# Patient Record
Sex: Male | Born: 1940 | Race: White | Hispanic: No | Smoking: Never smoker
Health system: Southern US, Community
[De-identification: ages and names within clinical notes are randomized; demographics above are authoritative.]

## PROBLEM LIST (undated history)

## (undated) DIAGNOSIS — I10 Essential (primary) hypertension: Secondary | ICD-10-CM

## (undated) DIAGNOSIS — E785 Hyperlipidemia, unspecified: Secondary | ICD-10-CM

## (undated) DIAGNOSIS — Z9079 Acquired absence of other genital organ(s): Secondary | ICD-10-CM

## (undated) HISTORY — DX: Acquired absence of other genital organ(s): Z90.79

## (undated) HISTORY — PX: OTHER SURGICAL HISTORY: SHX169

## (undated) HISTORY — DX: Hyperlipidemia, unspecified: E78.5

## (undated) HISTORY — DX: Essential (primary) hypertension: I10

## (undated) HISTORY — PX: ANKLE ARTHROSCOPY W/ OPEN REPAIR: SHX1145

## (undated) HISTORY — PX: PROSTATE SURGERY: SHX751

## (undated) HISTORY — PX: TONSILLECTOMY: SUR1361

---

## 2003-05-07 ENCOUNTER — Ambulatory Visit (HOSPITAL_COMMUNITY): Admission: RE | Admit: 2003-05-07 | Discharge: 2003-05-07 | Payer: Self-pay | Admitting: Urology

## 2003-06-04 ENCOUNTER — Ambulatory Visit: Admission: RE | Admit: 2003-06-04 | Discharge: 2003-06-26 | Payer: Self-pay | Admitting: Radiation Oncology

## 2004-04-21 ENCOUNTER — Ambulatory Visit: Payer: Self-pay | Admitting: Internal Medicine

## 2004-05-24 ENCOUNTER — Ambulatory Visit: Payer: Self-pay | Admitting: Internal Medicine

## 2004-07-27 ENCOUNTER — Ambulatory Visit: Payer: Self-pay | Admitting: Internal Medicine

## 2004-11-09 ENCOUNTER — Ambulatory Visit: Payer: Self-pay | Admitting: Internal Medicine

## 2005-02-22 ENCOUNTER — Ambulatory Visit: Payer: Self-pay | Admitting: Internal Medicine

## 2005-03-01 ENCOUNTER — Ambulatory Visit: Payer: Self-pay | Admitting: Internal Medicine

## 2005-08-11 ENCOUNTER — Ambulatory Visit: Payer: Self-pay | Admitting: Internal Medicine

## 2005-11-03 ENCOUNTER — Ambulatory Visit: Payer: Self-pay | Admitting: Internal Medicine

## 2005-11-10 ENCOUNTER — Ambulatory Visit: Payer: Self-pay | Admitting: Internal Medicine

## 2006-10-03 ENCOUNTER — Ambulatory Visit: Payer: Self-pay | Admitting: Internal Medicine

## 2006-10-26 ENCOUNTER — Ambulatory Visit: Payer: Self-pay | Admitting: Internal Medicine

## 2006-10-26 LAB — CONVERTED CEMR LAB
ALT: 24 units/L (ref 0–40)
AST: 28 units/L (ref 0–37)
Albumin: 4.1 g/dL (ref 3.5–5.2)
Alkaline Phosphatase: 74 units/L (ref 39–117)
Bilirubin, Direct: 0.1 mg/dL (ref 0.0–0.3)
Cholesterol: 209 mg/dL (ref 0–200)
Direct LDL: 86.5 mg/dL
HDL: 51.9 mg/dL (ref >39.0)
Total Bilirubin: 0.9 mg/dL (ref 0.3–1.2)
Total CHOL/HDL Ratio: 4
Total Protein: 6.5 g/dL (ref 6.0–8.3)
Triglycerides: 344 mg/dL (ref 0–149)
VLDL: 69 mg/dL — ABNORMAL HIGH (ref 0–40)

## 2006-11-13 ENCOUNTER — Ambulatory Visit: Payer: Self-pay | Admitting: Internal Medicine

## 2006-11-15 ENCOUNTER — Encounter: Payer: Self-pay | Admitting: Internal Medicine

## 2007-02-05 ENCOUNTER — Encounter: Payer: Self-pay | Admitting: Internal Medicine

## 2007-02-07 DIAGNOSIS — E781 Pure hyperglyceridemia: Secondary | ICD-10-CM

## 2007-02-07 DIAGNOSIS — I10 Essential (primary) hypertension: Secondary | ICD-10-CM

## 2007-02-08 ENCOUNTER — Ambulatory Visit: Payer: Self-pay | Admitting: Internal Medicine

## 2007-02-08 LAB — CONVERTED CEMR LAB
Cholesterol, target level: 200 mg/dL
HDL goal, serum: 40 mg/dL
LDL Goal: 130 mg/dL

## 2007-03-16 ENCOUNTER — Ambulatory Visit: Payer: Self-pay | Admitting: Internal Medicine

## 2007-05-30 ENCOUNTER — Ambulatory Visit: Payer: Self-pay | Admitting: Internal Medicine

## 2007-05-30 LAB — CONVERTED CEMR LAB
ALT: 25 units/L (ref 0–53)
AST: 27 units/L (ref 0–37)
Albumin: 4.2 g/dL (ref 3.5–5.2)
Alkaline Phosphatase: 43 units/L (ref 39–117)
Bilirubin, Direct: 0.2 mg/dL (ref 0.0–0.3)
Cholesterol: 176 mg/dL (ref 0–200)
HDL: 66.3 mg/dL (ref >39.0)
LDL Cholesterol: 94 mg/dL (ref 0–99)
Total Bilirubin: 0.8 mg/dL (ref 0.3–1.2)
Total CHOL/HDL Ratio: 2.7
Total Protein: 6.4 g/dL (ref 6.0–8.3)
Triglycerides: 77 mg/dL (ref 0–149)
VLDL: 15 mg/dL (ref 0–40)

## 2007-06-06 ENCOUNTER — Ambulatory Visit: Payer: Self-pay | Admitting: Internal Medicine

## 2007-06-06 DIAGNOSIS — M949 Disorder of cartilage, unspecified: Secondary | ICD-10-CM

## 2007-06-06 DIAGNOSIS — M899 Disorder of bone, unspecified: Secondary | ICD-10-CM | POA: Insufficient documentation

## 2007-06-06 LAB — CONVERTED CEMR LAB
Cholesterol, target level: 200 mg/dL
HDL goal, serum: 40 mg/dL
LDL Goal: 129 mg/dL

## 2007-06-11 ENCOUNTER — Telehealth: Payer: Self-pay | Admitting: Internal Medicine

## 2007-06-11 LAB — CONVERTED CEMR LAB: Vit D, 1,25-Dihydroxy: 22 — ABNORMAL LOW (ref 30–89)

## 2007-07-09 ENCOUNTER — Telehealth: Payer: Self-pay | Admitting: Internal Medicine

## 2007-07-10 ENCOUNTER — Ambulatory Visit: Payer: Self-pay | Admitting: Internal Medicine

## 2007-07-10 DIAGNOSIS — M5380 Other specified dorsopathies, site unspecified: Secondary | ICD-10-CM | POA: Insufficient documentation

## 2007-08-07 ENCOUNTER — Ambulatory Visit: Payer: Self-pay | Admitting: Internal Medicine

## 2007-09-05 ENCOUNTER — Encounter: Payer: Self-pay | Admitting: Internal Medicine

## 2007-09-18 ENCOUNTER — Ambulatory Visit: Payer: Self-pay | Admitting: Internal Medicine

## 2007-09-18 DIAGNOSIS — F4320 Adjustment disorder, unspecified: Secondary | ICD-10-CM | POA: Insufficient documentation

## 2007-09-18 LAB — CONVERTED CEMR LAB
BUN: 19 mg/dL (ref 6–23)
CO2: 30 meq/L (ref 19–32)
Calcium: 9.3 mg/dL (ref 8.4–10.5)
Chloride: 105 meq/L (ref 96–112)
Creatinine, Ser: 1 mg/dL (ref 0.4–1.5)
GFR calc non Af Amer: 79 mL/min

## 2007-09-27 ENCOUNTER — Ambulatory Visit: Payer: Self-pay | Admitting: Gastroenterology

## 2007-10-10 ENCOUNTER — Encounter: Payer: Self-pay | Admitting: Internal Medicine

## 2007-10-10 ENCOUNTER — Encounter: Payer: Self-pay | Admitting: Gastroenterology

## 2007-10-10 ENCOUNTER — Ambulatory Visit: Payer: Self-pay | Admitting: Gastroenterology

## 2007-10-11 ENCOUNTER — Encounter: Payer: Self-pay | Admitting: Gastroenterology

## 2008-01-23 ENCOUNTER — Ambulatory Visit: Payer: Self-pay | Admitting: Internal Medicine

## 2008-01-23 LAB — CONVERTED CEMR LAB
Albumin: 3.9 g/dL (ref 3.5–5.2)
HDL: 66.6 mg/dL (ref >39.0)
LDL Cholesterol: 92 mg/dL (ref 0–99)
Total Bilirubin: 0.8 mg/dL (ref 0.3–1.2)
Total CHOL/HDL Ratio: 2.6
Triglycerides: 62 mg/dL (ref 0–149)

## 2008-01-30 ENCOUNTER — Ambulatory Visit: Payer: Self-pay | Admitting: Internal Medicine

## 2008-02-04 LAB — CONVERTED CEMR LAB: Vit D, 1,25-Dihydroxy: 34 (ref 30–89)

## 2008-04-30 ENCOUNTER — Ambulatory Visit: Payer: Self-pay | Admitting: Internal Medicine

## 2008-04-30 LAB — CONVERTED CEMR LAB: Vit D, 1,25-Dihydroxy: 41 (ref 30–89)

## 2008-08-13 ENCOUNTER — Ambulatory Visit: Payer: Self-pay | Admitting: Internal Medicine

## 2008-08-13 LAB — CONVERTED CEMR LAB: Vit D, 25-Hydroxy: 37 ng/mL (ref 30–89)

## 2008-08-19 ENCOUNTER — Encounter: Payer: Self-pay | Admitting: Internal Medicine

## 2008-10-01 ENCOUNTER — Encounter: Payer: Self-pay | Admitting: Internal Medicine

## 2008-11-26 ENCOUNTER — Ambulatory Visit: Payer: Self-pay | Admitting: Internal Medicine

## 2008-11-26 LAB — CONVERTED CEMR LAB
Alkaline Phosphatase: 51 units/L (ref 39–117)
Bilirubin, Direct: 0 mg/dL (ref 0.0–0.3)
CRP, High Sensitivity: 1 (ref 0.00–5.00)
Calcium: 9 mg/dL (ref 8.4–10.5)
Eosinophils Relative: 4.4 % (ref 0.0–5.0)
GFR calc non Af Amer: 70.29 mL/min (ref >60)
HDL: 66.4 mg/dL (ref >39.00)
LDL Cholesterol: 96 mg/dL (ref 0–99)
Lymphocytes Relative: 31 % (ref 12.0–46.0)
MCV: 92.3 fL (ref 78.0–100.0)
Monocytes Absolute: 0.5 10*3/uL (ref 0.1–1.0)
Neutrophils Relative %: 55 % (ref 43.0–77.0)
Platelets: 183 10*3/uL (ref 150.0–400.0)
Potassium: 4.5 meq/L (ref 3.5–5.1)
Sodium: 145 meq/L (ref 135–145)
Total Bilirubin: 0.8 mg/dL (ref 0.3–1.2)
Total CHOL/HDL Ratio: 3
VLDL: 20.4 mg/dL (ref 0.0–40.0)
WBC: 5.3 10*3/uL (ref 4.5–10.5)

## 2008-12-03 ENCOUNTER — Ambulatory Visit: Payer: Self-pay | Admitting: Internal Medicine

## 2008-12-03 DIAGNOSIS — D649 Anemia, unspecified: Secondary | ICD-10-CM

## 2008-12-03 LAB — CONVERTED CEMR LAB
Basophils Absolute: 0 10*3/uL (ref 0.0–0.1)
Basophils Relative: 1 % (ref 0.0–3.0)
Eosinophils Absolute: 0.2 10*3/uL (ref 0.0–0.7)
Lymphocytes Relative: 31.3 % (ref 12.0–46.0)
MCHC: 34.3 g/dL (ref 30.0–36.0)
Neutrophils Relative %: 54.7 % (ref 43.0–77.0)
RBC: 4.15 M/uL — ABNORMAL LOW (ref 4.22–5.81)
RDW: 13 % (ref 11.5–14.6)
Saturation Ratios: 22.9 % (ref 20.0–50.0)
Transferrin: 265.1 mg/dL (ref 212.0–360.0)
Vitamin B-12: 279 pg/mL (ref 211–911)

## 2009-03-10 ENCOUNTER — Ambulatory Visit: Payer: Self-pay | Admitting: Internal Medicine

## 2009-03-24 ENCOUNTER — Ambulatory Visit: Payer: Self-pay | Admitting: Internal Medicine

## 2009-03-24 LAB — CONVERTED CEMR LAB
ALT: 23 units/L (ref 0–53)
Albumin: 4 g/dL (ref 3.5–5.2)
Alkaline Phosphatase: 48 units/L (ref 39–117)
Basophils Relative: 1.1 % (ref 0.0–3.0)
CO2: 29 meq/L (ref 19–32)
Chloride: 106 meq/L (ref 96–112)
Eosinophils Absolute: 0.2 10*3/uL (ref 0.0–0.7)
Hemoglobin: 13.6 g/dL (ref 13.0–17.0)
Lymphocytes Relative: 27.7 % (ref 12.0–46.0)
MCHC: 33.4 g/dL (ref 30.0–36.0)
MCV: 94.7 fL (ref 78.0–100.0)
Monocytes Absolute: 0.4 10*3/uL (ref 0.1–1.0)
Neutro Abs: 3.2 10*3/uL (ref 1.4–7.7)
Nitrite: NEGATIVE
RBC: 4.3 M/uL (ref 4.22–5.81)
Sodium: 145 meq/L (ref 135–145)
Specific Gravity, Urine: 1.02
Total CHOL/HDL Ratio: 3
Total Protein: 7.2 g/dL (ref 6.0–8.3)
WBC Urine, dipstick: NEGATIVE

## 2009-04-01 ENCOUNTER — Ambulatory Visit: Payer: Self-pay | Admitting: Internal Medicine

## 2009-04-01 DIAGNOSIS — Z96649 Presence of unspecified artificial hip joint: Secondary | ICD-10-CM

## 2009-07-20 ENCOUNTER — Telehealth: Payer: Self-pay | Admitting: Internal Medicine

## 2009-07-23 ENCOUNTER — Ambulatory Visit: Payer: Self-pay | Admitting: Internal Medicine

## 2009-07-23 LAB — CONVERTED CEMR LAB
ALT: 31 units/L (ref 0–53)
AST: 31 units/L (ref 0–37)
Alkaline Phosphatase: 49 units/L (ref 39–117)
Bilirubin, Direct: 0.2 mg/dL (ref 0.0–0.3)
Total CHOL/HDL Ratio: 2
Total Protein: 7 g/dL (ref 6.0–8.3)
Triglycerides: 95 mg/dL (ref 0.0–149.0)

## 2009-07-27 LAB — CONVERTED CEMR LAB: Vit D, 25-Hydroxy: 22 ng/mL — ABNORMAL LOW (ref 30–89)

## 2009-07-29 ENCOUNTER — Ambulatory Visit: Payer: Self-pay | Admitting: Internal Medicine

## 2009-07-29 DIAGNOSIS — R609 Edema, unspecified: Secondary | ICD-10-CM

## 2009-07-30 ENCOUNTER — Encounter: Payer: Self-pay | Admitting: Internal Medicine

## 2009-07-30 ENCOUNTER — Ambulatory Visit: Payer: Self-pay

## 2009-07-31 ENCOUNTER — Telehealth: Payer: Self-pay | Admitting: *Deleted

## 2009-09-16 DIAGNOSIS — E538 Deficiency of other specified B group vitamins: Secondary | ICD-10-CM

## 2009-10-28 ENCOUNTER — Ambulatory Visit: Payer: Self-pay | Admitting: Internal Medicine

## 2009-10-28 LAB — CONVERTED CEMR LAB: Vit D, 25-Hydroxy: 39 ng/mL (ref 30–89)

## 2009-11-03 ENCOUNTER — Ambulatory Visit: Payer: Self-pay | Admitting: Internal Medicine

## 2009-12-01 ENCOUNTER — Encounter: Payer: Self-pay | Admitting: Internal Medicine

## 2010-01-28 ENCOUNTER — Ambulatory Visit: Payer: Self-pay | Admitting: Internal Medicine

## 2010-01-28 LAB — CONVERTED CEMR LAB: Vit D, 25-Hydroxy: 36 ng/mL (ref 30–89)

## 2010-02-04 ENCOUNTER — Ambulatory Visit: Payer: Self-pay | Admitting: Internal Medicine

## 2010-02-04 LAB — CONVERTED CEMR LAB
AST: 32 units/L (ref 0–37)
Alkaline Phosphatase: 51 units/L (ref 39–117)
BUN: 18 mg/dL (ref 6–23)
Basophils Absolute: 0.1 10*3/uL (ref 0.0–0.1)
Calcium: 9.6 mg/dL (ref 8.4–10.5)
Eosinophils Absolute: 0.2 10*3/uL (ref 0.0–0.7)
GFR calc non Af Amer: 68.61 mL/min (ref >60)
Glucose, Bld: 98 mg/dL (ref 70–99)
HDL: 65.8 mg/dL (ref >39.00)
LDL Cholesterol: 112 mg/dL — ABNORMAL HIGH (ref 0–99)
Lymphocytes Relative: 27.4 % (ref 12.0–46.0)
MCHC: 33.9 g/dL (ref 30.0–36.0)
Monocytes Relative: 7.7 % (ref 3.0–12.0)
Neutrophils Relative %: 61.3 % (ref 43.0–77.0)
Platelets: 223 10*3/uL (ref 150.0–400.0)
Potassium: 5.7 meq/L — ABNORMAL HIGH (ref 3.5–5.1)
RDW: 13.9 % (ref 11.5–14.6)
Sodium: 144 meq/L (ref 135–145)
TSH: 1.22 microintl units/mL (ref 0.35–5.50)
Total Bilirubin: 0.4 mg/dL (ref 0.3–1.2)
VLDL: 17.8 mg/dL (ref 0.0–40.0)

## 2010-02-10 ENCOUNTER — Telehealth: Payer: Self-pay | Admitting: Internal Medicine

## 2010-02-11 ENCOUNTER — Ambulatory Visit: Payer: Self-pay | Admitting: Internal Medicine

## 2010-02-12 ENCOUNTER — Encounter: Payer: Self-pay | Admitting: Internal Medicine

## 2010-02-15 ENCOUNTER — Encounter: Payer: Self-pay | Admitting: Internal Medicine

## 2010-02-15 ENCOUNTER — Ambulatory Visit: Payer: Self-pay

## 2010-06-03 ENCOUNTER — Ambulatory Visit
Admission: RE | Admit: 2010-06-03 | Discharge: 2010-06-03 | Payer: Self-pay | Source: Home / Self Care | Attending: Internal Medicine | Admitting: Internal Medicine

## 2010-06-03 ENCOUNTER — Other Ambulatory Visit: Payer: Self-pay | Admitting: Internal Medicine

## 2010-06-03 LAB — CBC WITH DIFFERENTIAL/PLATELET
Basophils Absolute: 0 10*3/uL (ref 0.0–0.1)
Basophils Relative: 0.5 % (ref 0.0–3.0)
Eosinophils Absolute: 0.2 10*3/uL (ref 0.0–0.7)
Eosinophils Relative: 4.7 % (ref 0.0–5.0)
HCT: 38.2 % — ABNORMAL LOW (ref 39.0–52.0)
Hemoglobin: 13 g/dL (ref 13.0–17.0)
Lymphocytes Relative: 31.2 % (ref 12.0–46.0)
Lymphs Abs: 1.6 10*3/uL (ref 0.7–4.0)
MCHC: 34.1 g/dL (ref 30.0–36.0)
MCV: 93.3 fl (ref 78.0–100.0)
Monocytes Absolute: 0.5 10*3/uL (ref 0.1–1.0)
Monocytes Relative: 9.1 % (ref 3.0–12.0)
Neutro Abs: 2.8 10*3/uL (ref 1.4–7.7)
Neutrophils Relative %: 54.5 % (ref 43.0–77.0)
Platelets: 194 10*3/uL (ref 150.0–400.0)
RBC: 4.09 Mil/uL — ABNORMAL LOW (ref 4.22–5.81)
RDW: 13.8 % (ref 11.5–14.6)
WBC: 5.1 10*3/uL (ref 4.5–10.5)

## 2010-06-03 LAB — BASIC METABOLIC PANEL
BUN: 20 mg/dL (ref 6–23)
CO2: 28 mEq/L (ref 19–32)
Calcium: 9.4 mg/dL (ref 8.4–10.5)
Chloride: 103 mEq/L (ref 96–112)
Creatinine, Ser: 1.2 mg/dL (ref 0.4–1.5)
GFR: 63.92 mL/min (ref >60.00)
Glucose, Bld: 71 mg/dL (ref 70–99)
Potassium: 4.8 mEq/L (ref 3.5–5.1)
Sodium: 139 mEq/L (ref 135–145)

## 2010-06-03 LAB — B12 AND FOLATE PANEL
Folate: 24.1 ng/mL (ref >5.9)
Vitamin B-12: 308 pg/mL (ref 211–911)

## 2010-06-13 LAB — CONVERTED CEMR LAB: Cholesterol, target level: 200 mg/dL

## 2010-06-15 NOTE — Miscellaneous (Signed)
Summary: Orders Update  Clinical Lists Changes  Orders: Added new Test order of Venous Duplex Lower Extremity (Venous Duplex Lower) - Signed 

## 2010-06-15 NOTE — Progress Notes (Signed)
----   Converted from flag ---- ---- 07/31/2009 7:45 AM, Stacie Glaze MD wrote: Louis Kent, call pt no DVT... consider compression stocking would recommend 10-20 knee high  ---- 07/30/2009 1:36 PM, Missy Al-Rammal, RVT, RDCS wrote: No evidence of DVT, bilaterally. ------------------------------  Appended Document:  pt informed and doesnt want compression hose now but will call if he decides he wants and we will give script  Appended Document:        New Problems: VITAMIN B12 DEFICIENCY (ICD-266.2)   New Problems: VITAMIN B12 DEFICIENCY (ICD-266.2)

## 2010-06-15 NOTE — Letter (Signed)
Summary: Personalized Plan for Preventive Services  Personalized Plan for Preventive Services   Imported By: Maryln Gottron 02/09/2010 12:38:15  _____________________________________________________________________  External Attachment:    Type:   Image     Comment:   External Document

## 2010-06-15 NOTE — Assessment & Plan Note (Signed)
Summary: 4 month rov/njr   Vital Signs:  Patient profile:   70 year old male Height:      71 inches Weight:      236 pounds BMI:     33.03 Temp:     98.2 degrees F oral Pulse rate:   68 / minute Resp:     14 per minute BP sitting:   132 / 74  (left arm)  Vitals Entered By: Willy Eddy, LPN (July 29, 2009 9:53 AM) CC: roa labs- informed of vitmin d and med sent in, Lipid Management, Hypertension Management   CC:  roa labs- informed of vitmin d and med sent in, Lipid Management, and Hypertension Management.  History of Present Illness: swelling in lefs right greater that left post a "injury) HTN stable and lipid monitering todat... at goal  Hypertension History:      He denies headache, chest pain, palpitations, dyspnea with exertion, orthopnea, PND, peripheral edema, visual symptoms, neurologic problems, syncope, and side effects from treatment.  at goal.        Positive major cardiovascular risk factors include male age 46 years old or older, hyperlipidemia, and hypertension.  Negative major cardiovascular risk factors include no history of diabetes, negative family history for ischemic heart disease, and non-tobacco-user status.        Further assessment for target organ damage reveals no history of ASHD, stroke/TIA, or peripheral vascular disease.    Lipid Management History:      Positive NCEP/ATP III risk factors include male age 36 years old or older and hypertension.  Negative NCEP/ATP III risk factors include non-diabetic, HDL cholesterol greater than 60, no family history for ischemic heart disease, non-tobacco-user status, no ASHD (atherosclerotic heart disease), no prior stroke/TIA, no peripheral vascular disease, and no history of aortic aneurysm.      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 Once Daily 2)  Adult Aspirin Low Strength 81 Mg  Tbdp (Aspirin) .... Once Daily 3)  Daily Combo Multivits/calcium   Tabs (Multiple Vitamins-Calcium) .... Once Daily 4)  Fish Oil Concentrate 1000 Mg  Caps (Omega-3 Fatty Acids) .... 4 Capsules Once Daily 5)  Vitamin D 36644 Unit  Caps (Ergocalciferol) .Marland Kitchen.. 1every Week 6)  Oscal 500/200 D-3 500-200 Mg-Unit  Tabs (Calcium-Vitamin D) .... Once Daily 7)  Avapro 150 Mg  Tabs (Irbesartan) .Marland Kitchen.. 1 Once Daily 8)  Glucosamine-Chondroitin 1500-1200 Mg/62ml Liqd (Glucosamine-Chondroitin) .... 2 Once Daily  Allergies (verified): No Known Drug Allergies  Past History:  Family History: Last updated: 02/08/2007 Family History Lung cancer Family History of Stroke F 1st degree relative  age 76's  Social History: Last updated: 02/07/2007 Retired Married Never Smoked Alcohol use-yes Drug use-no  Risk Factors: Smoking Status: never (07/29/2009)  Past medical, surgical, family and social histories (including risk factors) reviewed, and no changes noted (except as noted below).  Past Medical History: Reviewed history from 02/07/2007 and no changes required. Hyperlipidemia Hypertension  Past Surgical History: Reviewed history from 02/08/2007 and no changes required. ankle repair Prostatectomy 2005 Tonsillectomy  Family History: Reviewed history from 02/08/2007 and no changes required. Family History Lung cancer Family History of Stroke F 1st degree relative  age 110's  Social History: Reviewed history from 02/07/2007 and no changes required. Retired Married Never Smoked Alcohol use-yes Drug  use-no  Review of Systems  The patient denies anorexia, fever, weight loss, weight gain, vision loss, decreased hearing, hoarseness, chest pain, syncope, dyspnea on exertion, peripheral edema, prolonged cough, headaches, hemoptysis, abdominal pain, melena, hematochezia, severe indigestion/heartburn, hematuria, incontinence, genital sores, muscle weakness, suspicious skin lesions, transient blindness, difficulty walking, depression, unusual weight change, abnormal bleeding, enlarged lymph nodes, angioedema, and breast masses.    Physical Exam  General:  Well-developed,well-nourished,in no acute distress; alert,appropriate and cooperative throughout examination Head:  normocephalic and atraumatic.   Eyes:  pupils equal and pupils round.   Ears:  R ear normal and L ear normal.   Nose:  no external deformity and no nasal discharge.   Mouth:  Oral mucosa and oropharynx without lesions or exudates.  Teeth in good repair. Neck:  supple and full ROM.   Lungs:  normal respiratory effort and no wheezes.   Abdomen:  Bowel sounds positive,abdomen soft and non-tender without masses, organomegaly or hernias noted. Msk:  No deformity or scoliosis noted of thoracic or lumbar spine.   Pulses:  R and L carotid,radial,femoral,dorsalis pedis and posterior tibial pulses are full and equal bilaterally Extremities:  trace left pedal edema and 1+ right pedal edema.   Neurologic:  No cranial nerve deficits noted. Station and gait are normal. Plantar reflexes are down-going bilaterally. DTRs are symmetrical throughout. Sensory, motor and coordinative functions appear intact.   Impression & Recommendations:  Problem # 1:  EDEMA (ICD-782.3) Assessment New  2 pulse new edema Discussed elevation of the legs, use of compression stockings, sodium restiction, and medication use.   Orders: Doppler Referral (Doppler)  Complete Medication List: 1)  Tricor 145 Mg Tabs (Fenofibrate) .... Once daily 2)  Adult Aspirin  Low Strength 81 Mg Tbdp (Aspirin) .... Once daily 3)  Daily Combo Multivits/calcium Tabs (Multiple vitamins-calcium) .... Once daily 4)  Fish Oil Concentrate 1000 Mg Caps (Omega-3 fatty acids) .... 4 capsules once daily 5)  Vitamin D 13086 Unit Caps (Ergocalciferol) .Marland Kitchen.. 1every week 6)  Oscal 500/200 D-3 500-200 Mg-unit Tabs (Calcium-vitamin d) .... Once daily 7)  Avapro 150 Mg Tabs (Irbesartan) .Marland Kitchen.. 1 once daily 8)  Glucosamine-chondroitin 1500-1200 Mg/56ml Liqd (Glucosamine-chondroitin) .... 2 once daily  Other Orders: Prescription Created Electronically [phone])  Hypertension Assessment/Plan:      The patient's hypertensive risk group is category B: At least one risk factor (excluding diabetes) with no target organ damage.  His calculated 10 year risk of coronary heart disease is 9 %.  Today's blood pressure is 132/74.  His blood pressure goal is < 140/90.  Lipid Assessment/Plan:      Based on NCEP/ATP III, the patient's risk factor category is "0-1 risk factors".  The patient's lipid goals are as follows: Total cholesterol goal is 200; LDL cholesterol goal is 160; HDL cholesterol goal is 40; Triglyceride goal is 150.  His LDL cholesterol goal has been met.    Patient Instructions: 1)  Please schedule a follow-up appointment in 3 months. 2)  vitamin d  733.00 Prescriptions: VITAMIN D 96295 UNIT  CAPS (ERGOCALCIFEROL) 1every week  #5 x 2   Entered by:   Willy Eddy, LPN   Authorized by:   Stacie Glaze MD   Signed by:  Willy Eddy, LPN on 16/02/9603   Method used:   Electronically to        [facility] [address] [facility] [address] [phone]* (retail)       [address]       [place], [place]  [address]       Ph: [phone] or [phone]       Fax: [phone]   RxID:   [id number]

## 2010-06-15 NOTE — Progress Notes (Signed)
Summary: REQ FOR VIT D CK  Phone Note Call from Patient   Caller: Patient [phone] Reason for Call: Acute Illness, Talk to Nurse, Talk to Doctor Summary of Call: Pt called to req that vit d ck be added to his labs that he is having drawn on Thurs. 07/23/2009.... Can you adv order for same?  Initial call taken by: Debbra Riding,  July 20, 2009 11:14 AM  Follow-up for Phone Call        yes please add vitamin d to list of labs Follow-up by: Willy Eddy, LPN,  July 20, 2009 11:23 AM  Additional Follow-up for Phone Call Additional follow up Details #1::        Phone Call Completed----Done. Additional Follow-up by: Debbra Riding,  July 20, 2009 11:39 AM

## 2010-06-15 NOTE — Miscellaneous (Signed)
Summary: Orders Update  Clinical Lists Changes 

## 2010-06-15 NOTE — Assessment & Plan Note (Signed)
Summary: PT WILL COME IN FASTING/NJR   Vital Signs:  Patient profile:   70 year old male Height:      71 inches Weight:      230 pounds BMI:     32.19 Temp:     98.2 degrees F oral Pulse rate:   72 / minute Resp:     14 per minute BP sitting:   110 / 70  (left arm)  Vitals Entered By: Willy Eddy, LPN (February 04, 2010 10:47 AM)  Nutrition Counseling: Patient's BMI is greater than 25 and therefore counseled on weight management options. CC: annual visit for disease management Is Patient Diabetic? No Nutritional Status BMI of 25 - 29 = overweight  Does patient need assistance? Functional Status Self care Ambulation Normal Comments pts ADLs are normal and has not increased risks for fall or home safety issues  Vision Screening:Left eye w/o correction: 20 / 20 Right Eye w/o correction: 20 / 20 Both eyes w/o correction:  20/ 20  Color vision testing: normal     Vision Comments: has reading galsses but no rx 40db HL: Left  Right  Audiometry Comment: can hear whispered voice    Prevention & Chronic Care Immunizations   Influenza vaccine: Fluvax 3+  (02/04/2010)   Influenza vaccine due: 01/15/2011    Tetanus booster: 05/16/2002: Historical   Tetanus booster due: 05/16/2012    Pneumococcal vaccine: Historical  (05/16/2004)    H. zoster vaccine: 02/04/2010: Zostavax  Colorectal Screening   Hemoccult: Not documented    Colonoscopy: Location:  [facility].    (10/10/2007)   Colonoscopy due: 09/2017  Other Screening   PSA: 0.01  (03/24/2009)   PSA action/deferral: Discussed-PSA requested  (02/04/2010)   Smoking status: never  (02/04/2010)  Lipids   Total Cholesterol: 183  (07/23/2009)   Lipid panel action/deferral: Lipid Panel ordered   LDL: 85  (07/23/2009)   LDL Direct: 86.5  (10/26/2006)   HDL: 79.30  (07/23/2009)   Triglycerides: 95.0  (07/23/2009)   Lipid panel due: 02/05/2011    SGOT (AST): 31  (07/23/2009)   BMP action:  Ordered   SGPT (ALT): 31  (07/23/2009)   Alkaline phosphatase: 49  (07/23/2009)   Total bilirubin: 0.6  (07/23/2009)    Lipid flowsheet reviewed?: Yes   Progress toward LDL goal: At goal  Hypertension   Last Blood Pressure: 110 / 70  (02/04/2010)   Serum creatinine: 1.2  (03/24/2009)   BMP action: Ordered   Serum potassium 4.8  (03/24/2009)    Hypertension flowsheet reviewed?: Yes   Progress toward BP goal: At goal  Self-Management Support :    Patient will work on the following items until the next clinic visit to reach self-care goals:     Medications and monitoring: take my medicines every day  (02/04/2010)     Eating: drink diet soda or water instead of juice or soda  (02/04/2010)     Activity: take a 30 minute walk every day  (02/04/2010)    Hypertension self-management support: BP self-monitoring log  (02/04/2010)    Lipid self-management support: Lipid monitoring log  (02/04/2010)    Nursing Instructions: Give Herpes zoster vaccine today    Primary Care Provider:  Stacie Glaze MD  CC:  annual visit for disease management.  History of Present Illness: Here for Medicare AWV:  1.   Risk factors based on Past M, S, F history:  CAD risk ande PAD risks identified with obesity, htn and hyperlipidemia  2.   Physical Activities:  walks one to two times a week for 30 min or less 3.   Depression/mood:  no depression  noted 4.   Hearing: whispered voice at 6 feet heard 5.   ADL's: performs all activites without limitations 6.   Fall Risk: none 7.   Home Safety:  no risks identified 8.   Height, weight, &visual acuity: see vitals 9.   Counseling: weight loss, immunizations 10.   Labs ordered based on risk factors: see orders 11.           Referral Coordination referral for AAA screen 12.           Care Plan scanned into record 13.            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) 8)  Muscle Spasm, Lumbar Region  (ICD-724.8) 9)  Osteopenia  (ICD-733.90) 10)  Hypertension  (ICD-401.9) 11)  Hyperlipidemia  (ICD-272.4)  Current Problems (verified): 1)  Vitamin B12 Deficiency  (ICD-266.2) 2)  Edema  (ICD-782.3) 3)  Morbid Obesity  (ICD-278.01) 4)  Osteoarthritis, 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 Once Daily 2)  Adult Aspirin Low Strength 81 Mg  Tbdp (Aspirin) .... Once Daily 3)  Daily Combo Multivits/calcium   Tabs (Multiple Vitamins-Calcium) .... Once Daily 4)  Fish Oil Concentrate 1000 Mg  Caps (Omega-3 Fatty Acids) .... 4 Capsules Once Daily 5)  Ergocalciferol 50000 Unit Caps (Ergocalciferol) .Marland Kitchen.. 1 Every Week 6)  Oscal 500/200 D-3 500-200 Mg-Unit  Tabs (Calcium-Vitamin D) .... Once Daily 7)  Avapro 150 Mg  Tabs (Irbesartan) .Marland Kitchen.. 1 Once Daily 8)  Glucosamine-Chondroitin 1500-1200 Mg/66ml Liqd (Glucosamine-Chondroitin) .... 2 Once Daily  Current Medications (verified): 1)  Tricor 145 Mg Tabs (Fenofibrate) .... Once Daily 2)  Adult Aspirin Low Strength 81 Mg  Tbdp (Aspirin) .... Once Daily 3)  Daily Combo Multivits/calcium   Tabs (Multiple Vitamins-Calcium)  .... Once Daily 4)  Fish Oil Concentrate 1000 Mg  Caps (Omega-3 Fatty Acids) .... 2 Capsules Once Daily 5)  Ergocalciferol 50000 Unit Caps (Ergocalciferol) .Marland Kitchen.. 1 Every Week 6)  Oscal 500/200 D-3 500-200 Mg-Unit  Tabs (Calcium-Vitamin D) .... Once Daily 7)  Avapro 150 Mg  Tabs (Irbesartan) .Marland Kitchen.. 1 Once Daily 8)  Glucosamine-Chondroitin 1500-1200 Mg/79ml Liqd (Glucosamine-Chondroitin) .... 2 Once Daily  Allergies (verified): No Known Drug Allergies  Past History:  Family History: Last updated: 02/08/2007 Family History Lung cancer Family History of Stroke F 1st degree relative  age 59's  Social History: Last updated: 02/07/2007 Retired Married Never Smoked Alcohol use-yes Drug use-no  Risk Factors: Smoking Status: never (02/04/2010)  Past  medical, surgical, family and social histories (including risk factors) reviewed, and no changes noted (except as noted below).  Past Medical History: Reviewed history from 02/07/2007 and no changes required. Hyperlipidemia Hypertension  Past Surgical History: Reviewed history from 02/08/2007 and no changes required. ankle repair Prostatectomy 2005 Tonsillectomy  Family History: Reviewed history from 02/08/2007 and no changes required. Family History Lung cancer Family History of Stroke F 1st degree relative  age 75's  Social History: Reviewed history from 02/07/2007 and no changes required. Retired Married Never Smoked Alcohol use-yes Drug use-no  Review of Systems  The patient denies anorexia, fever, weight loss, weight gain, vision loss, decreased hearing, hoarseness, chest pain, syncope, dyspnea on exertion, peripheral edema, prolonged cough, headaches, hemoptysis, abdominal pain, melena, hematochezia, severe indigestion/heartburn, hematuria, incontinence, genital sores, muscle weakness, suspicious skin lesions, transient blindness, difficulty walking, depression, unusual weight change, abnormal bleeding, enlarged lymph  nodes, angioedema, breast masses, and testicular masses.         Flu Vaccine Consent Questions     Do you have a history of severe allergic reactions to this vaccine? no    Any prior history of allergic reactions to egg and/or gelatin? no    Do you have a sensitivity to the preservative Thimersol? no    Do you have a past history of Guillan-Barre Syndrome? no    Do you currently have an acute febrile illness? no    Have you ever had a severe reaction to latex? no    Vaccine information given and explained to patient? yes    Are you currently pregnant? no    Lot Number:[id number]   Exp Date:11/13/2010   Site Given  Left Deltoid IM   Physical Exam  General:  Well-developed,well-nourished,in no acute distress; alert,appropriate and cooperative throughout examination Head:  normocephalic and atraumatic.   Eyes:  pupils equal and pupils round.   Ears:  R ear normal and L ear normal.   Nose:  no external deformity and no nasal discharge.   Mouth:  Oral mucosa and oropharynx without lesions or exudates.  Teeth in good repair. Neck:  supple and full ROM.   Lungs:  normal respiratory effort and no wheezes.   Heart:  normal rate and regular rhythm.   Abdomen:  Bowel sounds positive,abdomen soft and non-tender without masses, organomegaly or hernias noted. Prostate:  no nodules, no asymmetry, and 1+ enlarged.   Msk:  no joint swelling, no joint warmth, and joint tenderness.   Extremities:  trace left pedal edema and trace right pedal edema.   Neurologic:  alert & oriented X3 and gait normal.     Impression & Recommendations:  Problem # 1:  PREVENTIVE HEALTH CARE (ICD-V70.0) The pt was asked about all immunizations, health maint. services that are appropriate to their age and was given guidance on diet exercize  and weight management  Orders: Doppler Referral (Doppler)  Colonoscopy: Location:  [facility].   (10/10/2007) Td Booster: Historical (05/16/2002)   Flu Vax:  Fluvax 3+ (02/04/2010)   Pneumovax: Historical (05/16/2004) Chol: 183 (07/23/2009)   HDL: 79.30 (07/23/2009)   LDL: 85 (07/23/2009)   TG: 95.0 (07/23/2009) TSH: 1.93 (03/24/2009)   PSA: 0.01 (03/24/2009) Next Colonoscopy due:: 09/2017 (10/10/2007)  Discussed using sunscreen, use of alcohol, drug use, self testicular exam, routine dental care, routine eye care, routine physical exam, seat belts, multiple vitamins, osteoporosis prevention, adequate calcium intake in diet, and recommendations for immunizations.  Discussed exercise and checking cholesterol.  Discussed gun safety, safe sex,  and contraception. Also recommend checking PSA.  Problem # 2:  HYPERTENSION (ICD-401.9) Assessment: Unchanged  His updated medication list for this problem includes:    Avapro 150 Mg Tabs (Irbesartan) .Marland Kitchen... 1 once daily  Orders: Specimen Handling (78295) Doppler Referral (Doppler) TLB-BMP (Basic Metabolic Panel-BMET) (80048-METABOL)  BP today: 110/70 Prior BP: 130/80 (11/03/2009)  Prior 10 Yr Risk Heart Disease: 9 % (07/29/2009)  Labs Reviewed: K+: 4.8 (03/24/2009) Creat: : 1.2 (03/24/2009)   Chol: 183 (07/23/2009)   HDL: 79.30 (07/23/2009)   LDL: 85 (07/23/2009)   TG: 95.0 (07/23/2009)  Problem # 3:  HYPERLIPIDEMIA (ICD-272.4) Assessment: Unchanged  His updated medication list for this problem includes:    Tricor 145 Mg Tabs (Fenofibrate) ..... Once daily  Orders: TLB-TSH (Thyroid Stimulating Hormone) (84443-TSH) Venipuncture (62130) Specimen Handling (86578) TLB-Lipid Panel (80061-LIPID)  Labs Reviewed: SGOT: 31 (07/23/2009)   SGPT: 31 (07/23/2009)  Lipid Goals: Chol Goal: 200 (04/01/2009)   HDL Goal: 40 (04/01/2009)   LDL Goal: 160 (04/01/2009)   TG Goal: 150 (04/01/2009)  Prior 10 Yr Risk Heart Disease: 9 % (07/29/2009)   HDL:79.30 (07/23/2009), 57.60 (03/24/2009)  LDL:85 (07/23/2009), 103 (46/96/2952)  Chol:183 (07/23/2009), 184 (03/24/2009)  Trig:95.0 (07/23/2009), 116.0  (03/24/2009)  Problem # 4:  OSTEOARTHRITIS, HIP (ICD-715.95) Assessment: Unchanged  His updated medication list for this problem includes:    Adult Aspirin Low Strength 81 Mg Tbdp (Aspirin) ..... Once daily  Discussed use of medications, application of heat or cold, and exercises.   Complete Medication List: 1)  Tricor 145 Mg Tabs (Fenofibrate) .... Once daily 2)  Adult Aspirin Low Strength 81 Mg Tbdp (Aspirin) .... Once daily 3)  Daily Combo Multivits/calcium Tabs (Multiple vitamins-calcium) .... Once daily 4)  Fish Oil Concentrate 1000 Mg Caps (Omega-3 fatty acids) .... 2 capsules once daily 5)  Ergocalciferol 50000 Unit Caps (Ergocalciferol) .Marland Kitchen.. 1 every week 6)  Oscal 500/200 D-3 500-200 Mg-unit Tabs (Calcium-vitamin d) .... Once daily 7)  Avapro 150 Mg Tabs (Irbesartan) .Marland Kitchen.. 1 once daily 8)  Glucosamine-chondroitin 1500-1200 Mg/50ml Liqd (Glucosamine-chondroitin) .... 2 once daily  Other Orders: Flu Vaccine 57yrs + MEDICARE PATIENTS (W4132) Administration Flu vaccine - MCR (G0008) TLB-CBC Platelet - w/Differential (85025-CBCD) TLB-Hepatic/Liver Function Pnl (80076-HEPATIC)  Patient Instructions: 1)  Please schedule a follow-up appointment in 4 months.   Immunizations Administered:  Zostavax # 1:    Vaccine Type: Zostavax    Site: right deltoid    Mfr: Merck    Dose: 0.5 ml    Route: [place]    Given by: Willy Eddy, LPN    Exp. Date: 08/13/2010    Lot #: [id number]    VIS given: 02/25/05 given February 04, 2010.

## 2010-06-15 NOTE — Progress Notes (Signed)
Summary: lab  Phone Note Call from Patient Call back at Work Phone [phone]   Caller: vm Call For: bonnye Reason for Call: Lab or Test Results Initial call taken by: Rudy Jew, RN,  February 10, 2010 2:27 PM  Follow-up for Phone Call        GIVE LABS AND REPEAT POTASSIUM Follow-up by: Stacie Glaze MD,  February 10, 2010 5:31 PM  Additional Follow-up for Phone Call Additional follow up Details #1::        done Additional Follow-up by: Willy Eddy, LPN,  February 10, 2010 5:42 PM

## 2010-06-15 NOTE — Assessment & Plan Note (Signed)
Summary: 3 MTH ROV // RS   Vital Signs:  Patient profile:   70 year old male Height:      71 inches Weight:      234 pounds BMI:     32.75 Temp:     98.2 degrees F oral Pulse rate:   68 / minute Resp:     14 per minute BP sitting:   130 / 80  (left arm)  Vitals Entered By: Willy Eddy, LPN (November 03, 2009 11:58 AM)  Nutrition Counseling: Patient's BMI is greater than 25 and therefore counseled on weight management options. CC: roa labs, Hypertension Management, Lipid Management   CC:  roa labs, Hypertension Management, and Lipid Management.  History of Present Illness: weight stable the pt's blood pressure is stable he has not been exercizing nor has he developed a weigth loss plan he denies chest pain or exertional sob but has increased risk factors for cad   Hypertension History:      He denies headache, chest pain, palpitations, dyspnea with exertion, orthopnea, PND, peripheral edema, visual symptoms, neurologic problems, syncope, and side effects from treatment.        Positive major cardiovascular risk factors include male age 108 years old or older, hyperlipidemia, and hypertension.  Negative major cardiovascular risk factors include no history of diabetes, negative family history for ischemic heart disease, and non-tobacco-user status.        Further assessment for target organ damage reveals no history of ASHD, stroke/TIA, or peripheral vascular disease.    Lipid Management History:      Positive NCEP/ATP III risk factors include male age 53 years old or older and hypertension.  Negative NCEP/ATP III risk factors include non-diabetic, HDL cholesterol greater than 60, no family history for ischemic heart disease, non-tobacco-user status, no ASHD (atherosclerotic heart disease), no prior stroke/TIA, no peripheral vascular disease, and no history of aortic aneurysm.      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) 8)  Muscle Spasm, Lumbar Region  (ICD-724.8) 9)  Osteopenia  (ICD-733.90) 10)  Hypertension  (ICD-401.9) 11)  Hyperlipidemia  (ICD-272.4)  Current Problems (verified): 1)  Vitamin B12 Deficiency  (ICD-266.2) 2)  Edema  (ICD-782.3) 3)  Morbid Obesity  (ICD-278.01) 4)  Osteoarthritis, 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 Once Daily 2)  Adult Aspirin Low Strength 81 Mg  Tbdp (Aspirin) .... Once Daily 3)  Daily Combo Multivits/calcium   Tabs (Multiple Vitamins-Calcium) .... Once Daily 4)  Fish Oil Concentrate 1000 Mg  Caps (Omega-3 Fatty Acids) .... 4 Capsules Once Daily 5)  Ergocalciferol 50000 Unit Caps (Ergocalciferol) .Marland Kitchen.. 1 Every Week 6)  Oscal 500/200 D-3 500-200 Mg-Unit  Tabs (Calcium-Vitamin D) .... Once Daily 7)  Avapro 150 Mg  Tabs (Irbesartan) .Marland Kitchen.. 1 Once Daily 8)  Glucosamine-Chondroitin 1500-1200 Mg/34ml Liqd (Glucosamine-Chondroitin) .... 2 Once Daily  Current Medications (verified): 1)  Tricor 145 Mg Tabs (Fenofibrate) .... Once Daily 2)  Adult Aspirin Low Strength 81 Mg  Tbdp (Aspirin) .... Once Daily 3)  Daily Combo Multivits/calcium   Tabs (Multiple Vitamins-Calcium) .... Once Daily 4)  Fish Oil Concentrate 1000 Mg  Caps (Omega-3 Fatty Acids) .... 4 Capsules Once Daily 5)  Ergocalciferol 50000 Unit Caps (Ergocalciferol) .Marland Kitchen.. 1 Every Week 6)   Oscal 500/200 D-3 500-200 Mg-Unit  Tabs (Calcium-Vitamin D) .... Once Daily 7)  Avapro 150 Mg  Tabs (Irbesartan) .Marland Kitchen.. 1 Once Daily 8)  Glucosamine-Chondroitin 1500-1200 Mg/42ml Liqd (Glucosamine-Chondroitin) .... 2 Once Daily  Allergies (verified): No Known Drug Allergies  Past History:  Family History: Last updated: 02/08/2007 Family History Lung cancer Family History of Stroke F 1st degree relative  age 81's  Social History: Last updated: 02/07/2007 Retired Married Never Smoked Alcohol use-yes Drug use-no  Risk Factors: Smoking Status: never (11/03/2009)  Past medical, surgical, family and social histories (including risk factors) reviewed, and no changes noted (except as noted below).  Past Medical History: Reviewed history from 02/07/2007 and no changes required. Hyperlipidemia Hypertension  Past Surgical History: Reviewed history from 02/08/2007 and no changes required. ankle repair Prostatectomy 2005 Tonsillectomy  Family History: Reviewed history from 02/08/2007 and no changes required. Family History Lung cancer Family History of Stroke F 1st degree relative  age 47's  Social History: Reviewed history from 02/07/2007 and no changes required. Retired Married Never Smoked Alcohol use-yes Drug use-no  Review of Systems  The patient denies anorexia, fever, weight loss, weight gain, vision loss, decreased hearing, hoarseness, chest pain, syncope, dyspnea on exertion, peripheral edema, prolonged cough, headaches, hemoptysis, abdominal pain, melena, hematochezia, severe indigestion/heartburn, hematuria, incontinence, genital sores, muscle weakness, suspicious skin lesions, transient blindness, difficulty walking, depression, unusual weight change, abnormal bleeding, enlarged lymph nodes, angioedema, breast masses, and testicular masses.    Physical Exam  General:  Well-developed,well-nourished,in no acute distress; alert,appropriate and cooperative throughout  examination Head:  normocephalic and atraumatic.   Eyes:  pupils equal and pupils round.   Ears:  R ear normal and L ear normal.   Nose:  no external deformity and no nasal discharge.   Mouth:  Oral mucosa and oropharynx without lesions or exudates.  Teeth in good repair. Neck:  supple and full ROM.   Lungs:  normal respiratory effort and no wheezes.   Heart:  normal rate and regular rhythm.   Abdomen:  Bowel sounds positive,abdomen soft and non-tender without masses, organomegaly or hernias noted. Msk:  No deformity or scoliosis noted of thoracic or lumbar spine.     Impression & Recommendations:  Problem # 1:  MORBID OBESITY (ICD-278.01) Assessment Deteriorated weight loss paln is essential have recommended weight watchers Ht: 71 (11/03/2009)   Wt: 234 (11/03/2009)   BMI: 32.75 (11/03/2009)  Problem # 2:  OSTEOARTHRITIS, HIP (ICD-715.95) Assessment: Improved  hip is better, stretching and ice recommended  His updated medication list for this problem includes:    Adult Aspirin Low Strength 81 Mg Tbdp (Aspirin) ..... Once daily  Discussed use of medications, application of heat or cold, and exercises.   Problem # 3:  OSTEOPENIA (ICD-733.90) continue the 50,000  Discussed medication use, applications of heat or ice, and exercises.   Problem # 4:  UNSPECIFIED ANEMIA (ICD-285.9)  Hgb: 13.6 (03/24/2009)   Hct: 40.7 (03/24/2009)   Platelets: 231.0 (03/24/2009) RBC: 4.30 (03/24/2009)   RDW: 12.9 (03/24/2009)   WBC: 5.4 (03/24/2009) MCV: 94.7 (03/24/2009)   MCHC: 33.4 (  03/24/2009) Iron: 85 (12/03/2008)   % Sat: 22.9 (12/03/2008) B12: 279 (12/03/2008)   Folate: >20.0 ng/mL (12/03/2008)   TSH: 1.93 (03/24/2009)  Problem # 5:  HYPERTENSION (ICD-401.9) Assessment: Unchanged  His updated medication list for this problem includes:    Avapro 150 Mg Tabs (Irbesartan) .Marland Kitchen... 1 once daily  BP today: 130/80 Prior BP: 132/74 (07/29/2009)  Prior 10 Yr Risk Heart Disease: 9 %  (07/29/2009)  Labs Reviewed: K+: 4.8 (03/24/2009) Creat: : 1.2 (03/24/2009)   Chol: 183 (07/23/2009)   HDL: 79.30 (07/23/2009)   LDL: 85 (07/23/2009)   TG: 95.0 (07/23/2009)  Problem # 6:  HYPERLIPIDEMIA (ICD-272.4) Assessment: Unchanged  His updated medication list for this problem includes:    Tricor 145 Mg Tabs (Fenofibrate) ..... Once daily  Labs Reviewed: SGOT: 31 (07/23/2009)   SGPT: 31 (07/23/2009)  Lipid Goals: Chol Goal: 200 (04/01/2009)   HDL Goal: 40 (04/01/2009)   LDL Goal: 160 (04/01/2009)   TG Goal: 150 (04/01/2009)  Prior 10 Yr Risk Heart Disease: 9 % (07/29/2009)   HDL:79.30 (07/23/2009), 57.60 (03/24/2009)  LDL:85 (07/23/2009), 103 (16/02/9603)  Chol:183 (07/23/2009), 184 (03/24/2009)  Trig:95.0 (07/23/2009), 116.0 (03/24/2009)  Complete Medication List: 1)  Tricor 145 Mg Tabs (Fenofibrate) .... Once daily 2)  Adult Aspirin Low Strength 81 Mg Tbdp (Aspirin) .... Once daily 3)  Daily Combo Multivits/calcium Tabs (Multiple vitamins-calcium) .... Once daily 4)  Fish Oil Concentrate 1000 Mg Caps (Omega-3 fatty acids) .... 4 capsules once daily 5)  Ergocalciferol 50000 Unit Caps (Ergocalciferol) .Marland Kitchen.. 1 every week 6)  Oscal 500/200 D-3 500-200 Mg-unit Tabs (Calcium-vitamin d) .... Once daily 7)  Avapro 150 Mg Tabs (Irbesartan) .Marland Kitchen.. 1 once daily 8)  Glucosamine-chondroitin 1500-1200 Mg/37ml Liqd (Glucosamine-chondroitin) .... 2 once daily  Hypertension Assessment/Plan:      The patient's hypertensive risk group is category B: At least one risk factor (excluding diabetes) with no target organ damage.  His calculated 10 year risk of coronary heart disease is 9 %.  Today's blood pressure is 130/80.  His blood pressure goal is < 140/90.  Lipid Assessment/Plan:      Based on NCEP/ATP III, the patient's risk factor category is "0-1 risk factors".  The patient's lipid goals are as follows: Total cholesterol goal is 200; LDL cholesterol goal is 160; HDL cholesterol goal is 40;  Triglyceride goal is 150.  His LDL cholesterol goal has been met.    Patient Instructions: 1)  Please schedule a follow-up appointment in 3 months. 2)  vitamin d  level  733.00 3)  next appointment 30 min fasting  fr medicare preventitiative Prescriptions: ERGOCALCIFEROL 50000 UNIT CAPS (ERGOCALCIFEROL) 1 every week  #5 x 3   Entered and Authorized by:   Stacie Glaze MD   Signed by:   Stacie Glaze MD on 11/03/2009   Method used:   Electronically to        [facility] [address] [phone]* (retail)       [address]       [place], [place]  [address]       Ph: [phone] or [phone]       Fax: [phone]   RxID:   [id number]

## 2010-06-15 NOTE — Miscellaneous (Signed)
Summary: Orders Update  Clinical Lists Changes  Orders: Added new Test order of Abdominal Aorta Duplex (Abd Aorta Duplex) - Signed 

## 2010-06-15 NOTE — Letter (Signed)
Summary: [facility]  [facility]   Imported By: Maryln Gottron 12/04/2009 15:07:34  _____________________________________________________________________  External Attachment:    Type:   Image     Comment:   External Document

## 2010-06-17 NOTE — Assessment & Plan Note (Signed)
Summary: 4 month rov/njr   Vital Signs:  Patient profile:   70 year old male Height:      71 inches Weight:      236 pounds BMI:     33.03 Temp:     98.2 degrees F oral Pulse rate:   72 / minute Resp:     14 per minute BP sitting:   140 / 90  (left arm)  Vitals Entered By: Willy Eddy, LPN (June 03, 2010 9:57 AM) CC: roa-c/o left hip pain0has seen dr Brynda Greathouse, Hypertension Management Is Patient Diabetic? No   Primary Care Provider:  Stacie Glaze MD  CC:  roa-c/o left hip pain0has seen dr Brynda Greathouse and Hypertension Management.  History of Present Illness: increased pain in left hip taking advil weight gain and blood pressure verntral hernia with reducibility  Hypertension History:      He denies headache, chest pain, palpitations, dyspnea with exertion, orthopnea, PND, peripheral edema, visual symptoms, neurologic problems, syncope, and side effects from treatment.        Positive major cardiovascular risk factors include male age 43 years old or older, hyperlipidemia, and hypertension.  Negative major cardiovascular risk factors include no history of diabetes, negative family history for ischemic heart disease, and non-tobacco-user status.        Further assessment for target organ damage reveals no history of ASHD, stroke/TIA, or peripheral vascular disease.     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) 8)  Muscle Spasm, Lumbar Region  (ICD-724.8) 9)  Osteopenia  (ICD-733.90) 10)  Hypertension  (ICD-401.9) 11)  Hyperlipidemia   (ICD-272.4)  Current Problems (verified): 1)  Vitamin B12 Deficiency  (ICD-266.2) 2)  Edema  (ICD-782.3) 3)  Morbid Obesity  (ICD-278.01) 4)  Osteoarthritis, 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 Once Daily 2)  Adult Aspirin Low Strength 81 Mg  Tbdp (Aspirin) .... Once Daily 3)  Daily Combo Multivits/calcium   Tabs (Multiple Vitamins-Calcium) .... Once Daily 4)  Fish Oil Concentrate 1000 Mg  Caps (Omega-3 Fatty Acids) .... 2 Capsules Once Daily 5)  Ergocalciferol 50000 Unit Caps (Ergocalciferol) .Marland Kitchen.. 1 Every Week 6)  Oscal 500/200 D-3 500-200 Mg-Unit  Tabs (Calcium-Vitamin D) .... Once Daily 7)  Avapro 150 Mg  Tabs (Irbesartan) .Marland Kitchen.. 1 Once Daily 8)  Glucosamine-Chondroitin 1500-1200 Mg/3ml Liqd (Glucosamine-Chondroitin) .... 2 Once Daily  Current Medications (verified): 1)  Tricor 145 Mg Tabs (Fenofibrate) .... Once Daily 2)  Adult Aspirin Low Strength 81 Mg  Tbdp (Aspirin) .... Once Daily 3)  Daily Combo Multivits/calcium   Tabs (Multiple Vitamins-Calcium) .... Once Daily 4)  Fish Oil Concentrate 1000 Mg  Caps (Omega-3 Fatty Acids) .... 2 Capsules Once Daily 5)  Ergocalciferol 50000 Unit Caps (Ergocalciferol) .Marland Kitchen.. 1 Every  Week 6)  Oscal 500/200 D-3 500-200 Mg-Unit  Tabs (Calcium-Vitamin D) .... Once Daily 7)  Avapro 150 Mg  Tabs (Irbesartan) .Marland Kitchen.. 1 Once Daily 8)  Glucosamine-Chondroitin 1500-1200 Mg/75ml Liqd (Glucosamine-Chondroitin) .... 2 Once Daily  Allergies (verified): No Known Drug Allergies  Past History:  Family History: Last updated: 02/08/2007 Family History Lung cancer Family History of Stroke F 1st degree relative  age 65's  Social  History: Last updated: 02/07/2007 Retired Married Never Smoked Alcohol use-yes Drug use-no  Risk Factors: Alcohol Use: <1 (06/03/2010)  Risk Factors: Smoking Status: never (06/03/2010)  Past medical, surgical, family and social histories (including risk factors) reviewed, and no changes noted (except as noted below).  Past Medical History: Reviewed history from 02/07/2007 and no changes required. Hyperlipidemia Hypertension  Past Surgical History: Reviewed history from 02/08/2007 and no changes required. ankle repair Prostatectomy 2005 Tonsillectomy  Family History: Reviewed history from 02/08/2007 and no changes required. Family History Lung cancer Family History of Stroke F 1st degree relative  age 87's  Social History: Reviewed history from 02/07/2007 and no changes required. Retired Married Never Smoked Alcohol use-yes Drug use-no  Review of Systems  The patient denies anorexia, fever, weight loss, weight gain, vision loss, decreased hearing, hoarseness, chest pain, syncope, dyspnea on exertion, peripheral edema, prolonged cough, headaches, hemoptysis, abdominal pain, melena, hematochezia, severe indigestion/heartburn, hematuria, incontinence, genital sores, muscle weakness, suspicious skin lesions, transient blindness, difficulty walking, depression, unusual weight change, abnormal bleeding, enlarged lymph nodes, angioedema, and breast masses.    Physical Exam  General:  Well-developed,well-nourished,in no acute distress; alert,appropriate and cooperative throughout examination Head:  normocephalic and atraumatic.   Eyes:  pupils equal and pupils round.   Ears:  R ear normal and L ear normal.   Nose:  no external deformity and no nasal discharge.   Mouth:  Oral mucosa and oropharynx without lesions or exudates.  Teeth in good repair. Neck:  supple and full ROM.   Lungs:  normal respiratory effort and no wheezes.   Heart:  normal rate and regular rhythm.    Abdomen:  Bowel sounds positive,abdomen soft and non-tender without masses, organomegaly or hernias noted. Msk:  no joint swelling, no joint warmth, and joint tenderness.   Pulses:  R and L carotid,radial,femoral,dorsalis pedis and posterior tibial pulses are full and equal bilaterally Extremities:  trace left pedal edema and trace right pedal edema.   Neurologic:  alert & oriented X3 and gait normal.     Impression & Recommendations:  Problem # 1:  HYPERLIPIDEMIA (ICD-272.4)  His updated medication list for this problem includes:    Tricor 145 Mg Tabs (Fenofibrate) ..... Once daily  Labs Reviewed: SGOT: 32 (02/04/2010)   SGPT: 30 (02/04/2010)  Lipid Goals: Chol Goal: 200 (04/01/2009)   HDL Goal: 40 (04/01/2009)   LDL Goal: 160 (04/01/2009)   TG Goal: 150 (04/01/2009)  Prior 10 Yr Risk Heart Disease: 9 % (07/29/2009)   HDL:65.80 (02/04/2010), 79.30 (07/23/2009)  LDL:112 (02/04/2010), 85 (16/02/9603)  Chol:196 (02/04/2010), 183 (07/23/2009)  Trig:89.0 (02/04/2010), 95.0 (07/23/2009)  Problem # 2:  HYPERTENSION (ICD-401.9)  blood pressure stable His updated medication list for this problem includes:    Avapro 150 Mg Tabs (Irbesartan) .Marland Kitchen... 1 once daily  BP today: 136/90 Prior BP: 110/70 (02/04/2010)  Prior 10 Yr Risk Heart Disease: 9 % (07/29/2009)  Labs Reviewed: K+: 4.7 (02/11/2010) Creat: : 1.1 (02/04/2010)   Chol: 196 (02/04/2010)   HDL: 65.80 (02/04/2010)   LDL: 112 (02/04/2010)   TG: 89.0 (02/04/2010)  Orders: TLB-BMP (Basic Metabolic Panel-BMET) (80048-METABOL) Venipuncture (54098)  Complete Medication List: 1)  Tricor 145 Mg Tabs (Fenofibrate) .... Once daily 2)  Adult Aspirin Low Strength 81 Mg Tbdp (Aspirin) .... Once daily 3)  Daily Combo Multivits/calcium Tabs (Multiple vitamins-calcium) .... Once daily 4)  Fish Oil Concentrate 1000 Mg Caps (Omega-3 fatty acids) .... 2 capsules once daily 5)  Ergocalciferol 50000 Unit Caps (Ergocalciferol) .Marland Kitchen.. 1 every  week 6)  Oscal 500/200 D-3 500-200 Mg-unit Tabs (Calcium-vitamin d) .... Once daily 7)  Avapro 150 Mg Tabs (Irbesartan) .Marland Kitchen.. 1 once daily 8)  Glucosamine-chondroitin 1500-1200 Mg/1ml Liqd (Glucosamine-chondroitin) .... 2 once daily  Other Orders: TLB-B12 + Folate Pnl ([id number]-B12/FOL) TLB-CBC Platelet - w/Differential (85025-CBCD)  Hypertension Assessment/Plan:      The patient's hypertensive risk group is category B: At least one risk factor (excluding diabetes) with no target organ damage.  His calculated 10 year risk of coronary heart disease is 22 %.  Today's blood pressure is 140/90.  His blood pressure goal is < 140/90.  Patient Instructions: 1)  Please schedule a follow-up appointment in 4 months. 2)  consider hip resurfacing... sooner that later. 3)  You need to lose weight. Consider a lower calorie diet and regular exercise.    Orders Added: 1)  Est. Patient Level IV [11914] 2)  TLB-B12 + Folate Pnl [[id number]-B12/FOL] 3)  TLB-CBC Platelet - w/Differential [85025-CBCD] 4)  TLB-BMP (Basic Metabolic Panel-BMET) [80048-METABOL] 5)  Venipuncture [[id number]]  Appended Document: Orders Update    Clinical Lists Changes  Orders: Added new Service order of Specimen Handling (62130) - Signed

## 2010-08-28 ENCOUNTER — Other Ambulatory Visit: Payer: Self-pay | Admitting: Internal Medicine

## 2010-09-05 ENCOUNTER — Other Ambulatory Visit: Payer: Self-pay | Admitting: Internal Medicine

## 2010-09-24 ENCOUNTER — Encounter: Payer: Self-pay | Admitting: Internal Medicine

## 2010-09-30 ENCOUNTER — Encounter: Payer: Self-pay | Admitting: Internal Medicine

## 2010-09-30 ENCOUNTER — Ambulatory Visit (INDEPENDENT_AMBULATORY_CARE_PROVIDER_SITE_OTHER): Payer: Medicare Other | Admitting: Internal Medicine

## 2010-09-30 VITALS — BP 140/80 | HR 72 | Temp 98.7°F | Resp 16 | Ht 71.0 in | Wt 232.0 lb

## 2010-09-30 DIAGNOSIS — M169 Osteoarthritis of hip, unspecified: Secondary | ICD-10-CM

## 2010-09-30 DIAGNOSIS — F4320 Adjustment disorder, unspecified: Secondary | ICD-10-CM

## 2010-09-30 DIAGNOSIS — M949 Disorder of cartilage, unspecified: Secondary | ICD-10-CM

## 2010-09-30 DIAGNOSIS — M899 Disorder of bone, unspecified: Secondary | ICD-10-CM

## 2010-09-30 DIAGNOSIS — I10 Essential (primary) hypertension: Secondary | ICD-10-CM

## 2010-09-30 DIAGNOSIS — M8580 Other specified disorders of bone density and structure, unspecified site: Secondary | ICD-10-CM

## 2010-09-30 DIAGNOSIS — E785 Hyperlipidemia, unspecified: Secondary | ICD-10-CM

## 2010-09-30 NOTE — Progress Notes (Signed)
  Subjective:    Patient ID: Louis Kent, male    DOB: 24-Mar-1941, 70 y.o.   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  He was last seen by an orthopedist who recommended hip replacement approximately 2 years ago he had an MRI at that time it showed moderately severe degeneration.  Had a hip injection which allowed him to be relatively pain-free for about 6-9 months however now the pain is rated 7/10 and he has difficulty with mobility.    Review of Systems  Constitutional: Negative for fever and fatigue.  HENT: Negative for hearing loss, congestion, neck pain and postnasal drip.   Eyes: Negative for discharge, redness and visual disturbance.  Respiratory: Negative for cough, shortness of breath and wheezing.   Cardiovascular: Negative for leg swelling.  Gastrointestinal: Negative for abdominal pain, constipation and abdominal distention.  Genitourinary: Negative for urgency and frequency.  Musculoskeletal: Positive for myalgias, back pain, joint swelling and gait problem. Negative for arthralgias.  Skin: Negative for color change and rash.  Neurological: Negative for weakness and light-headedness.  Hematological: Negative for adenopathy.  Psychiatric/Behavioral: Negative for behavioral problems.   Past Medical History  Diagnosis Date  . Hyperlipidemia   . Hypertension   . S/P prostatectomy    Past Surgical History  Procedure Date  . Ankle arthroscopy w/ open repair   . Prostate surgery   . Tonsillectomy     reports that he has never smoked. He does not have any smokeless tobacco history on file. He reports that he drinks alcohol. He reports that he does not use illicit drugs. family history includes Cancer in his father; Diabetes in his  mother; Lung cancer in an unspecified family member; and Stroke in his mother. No Known Allergies     Objective:   Physical Exam  Constitutional: He appears well-developed and well-nourished.  HENT:  Head: Normocephalic and atraumatic.  Eyes: Conjunctivae are normal. Pupils are equal, round, and reactive to light.  Neck: Normal range of motion. Neck supple.  Cardiovascular: Normal rate and regular rhythm.   Pulmonary/Chest: Effort normal and breath sounds normal.  Abdominal: Soft. Bowel sounds are normal.  Musculoskeletal: He exhibits edema and tenderness.          Assessment & Plan:  I have given him the name of the patient has had hip resurfacing for him to contact. After that conversation will refer him to [facility] for consultation for hip replacement versus hip resurfacing and will obtain a new MRI prior to surgery due to the length of time since previous MRI of the degeneration of his hip as evidenced by worsening pain and worsening gait.

## 2010-09-30 NOTE — Assessment & Plan Note (Signed)
Pain has worsened and will consider hip resurfacing Saw Dr Valorie Roosevelt Hip resurfacing? Will needed

## 2010-10-04 ENCOUNTER — Other Ambulatory Visit: Payer: Self-pay | Admitting: *Deleted

## 2010-10-04 MED ORDER — IRBESARTAN 150 MG PO TABS: 150.0000 mg | ORAL_TABLET | Freq: Every day | ORAL | Status: DC

## 2010-11-21 ENCOUNTER — Other Ambulatory Visit: Payer: Self-pay | Admitting: Internal Medicine

## 2011-02-11 ENCOUNTER — Other Ambulatory Visit: Payer: Self-pay | Admitting: Internal Medicine

## 2011-02-17 ENCOUNTER — Encounter: Payer: Self-pay | Admitting: Internal Medicine

## 2011-02-17 ENCOUNTER — Ambulatory Visit (INDEPENDENT_AMBULATORY_CARE_PROVIDER_SITE_OTHER): Payer: Medicare Other | Admitting: Internal Medicine

## 2011-02-17 VITALS — BP 140/80 | HR 76 | Temp 98.2°F | Resp 16 | Ht 71.0 in | Wt 228.0 lb

## 2011-02-17 DIAGNOSIS — E785 Hyperlipidemia, unspecified: Secondary | ICD-10-CM

## 2011-02-17 DIAGNOSIS — D649 Anemia, unspecified: Secondary | ICD-10-CM

## 2011-02-17 DIAGNOSIS — Z0000 Encounter for general adult medical examination without abnormal findings: Secondary | ICD-10-CM

## 2011-02-17 DIAGNOSIS — I10 Essential (primary) hypertension: Secondary | ICD-10-CM

## 2011-02-17 DIAGNOSIS — R609 Edema, unspecified: Secondary | ICD-10-CM

## 2011-02-17 DIAGNOSIS — M949 Disorder of cartilage, unspecified: Secondary | ICD-10-CM

## 2011-02-17 DIAGNOSIS — Z23 Encounter for immunization: Secondary | ICD-10-CM

## 2011-02-17 DIAGNOSIS — E538 Deficiency of other specified B group vitamins: Secondary | ICD-10-CM

## 2011-02-17 LAB — CBC WITH DIFFERENTIAL/PLATELET
Basophils Relative: 0.6 % (ref 0.0–3.0)
Eosinophils Relative: 2.8 % (ref 0.0–5.0)
HCT: 36.7 % — ABNORMAL LOW (ref 39.0–52.0)
Lymphs Abs: 1.4 10*3/uL (ref 0.7–4.0)
MCV: 92.8 fl (ref 78.0–100.0)
Monocytes Absolute: 0.4 10*3/uL (ref 0.1–1.0)
Neutrophils Relative %: 59.8 % (ref 43.0–77.0)
RBC: 3.96 Mil/uL — ABNORMAL LOW (ref 4.22–5.81)
WBC: 5.1 10*3/uL (ref 4.5–10.5)

## 2011-02-17 LAB — LIPID PANEL
Cholesterol: 173 mg/dL (ref 0–200)
HDL: 75.8 mg/dL (ref >39.00)
VLDL: 17.8 mg/dL (ref 0.0–40.0)

## 2011-02-17 LAB — IRON: Iron: 80 ug/dL (ref 42–165)

## 2011-02-17 LAB — BASIC METABOLIC PANEL
BUN: 21 mg/dL (ref 6–23)
Calcium: 9.3 mg/dL (ref 8.4–10.5)
GFR: 76.21 mL/min (ref >60.00)
Glucose, Bld: 106 mg/dL — ABNORMAL HIGH (ref 70–99)
Sodium: 143 mEq/L (ref 135–145)

## 2011-02-17 LAB — HEPATIC FUNCTION PANEL
Albumin: 4.2 g/dL (ref 3.5–5.2)
Alkaline Phosphatase: 70 U/L (ref 39–117)

## 2011-02-17 NOTE — Progress Notes (Signed)
Subjective:    Patient ID: Louis Kent, male    DOB: [birth year], 70 y.o.   MRN: [id number]  HPI He  presents for Medicare wellness examination this is a subsequent examination  He follows for blood pressure he is  followed for osteoarthritis and this visit serves as followup for postsurgical complications following his hip replacement with an anterior approach.  He was placed on iron for blood loss from surgery he was also placed on vitamin C for skin healing he was placed on a new protocol 2 adult aspirin a day for anticoagulant projects.  He is also on fish oil thousand milligrams 2 twice daily for a hx of hyperlipidemia     Review of Systems  Constitutional: Negative for fever and fatigue.  HENT: Negative for hearing loss, congestion, neck pain and postnasal drip.   Eyes: Negative for discharge, redness and visual disturbance.  Respiratory: Negative for cough, shortness of breath and wheezing.   Cardiovascular: Negative for leg swelling.  Gastrointestinal: Negative for abdominal pain, constipation and abdominal distention.  Genitourinary: Negative for urgency and frequency.  Musculoskeletal: Negative for joint swelling and arthralgias.  Skin: Negative for color change and rash.  Neurological: Negative for weakness and light-headedness.  Hematological: Negative for adenopathy.  Psychiatric/Behavioral: Negative for behavioral problems.   Past Medical History  Diagnosis Date  . Hyperlipidemia   . Hypertension   . S/P prostatectomy     History   Social History  . Marital Status: Widowed    Spouse Name: N/A    Number of Children: N/A  . Years of Education: N/A   Occupational History  . retired    Social History Main Topics  . Smoking status: Never Smoker   . Smokeless tobacco: Not on file  . Alcohol Use: Yes  . Drug Use: No  . Sexually Active: Not Currently   Other Topics Concern  . Not on file   Social History Narrative  . No narrative on file    Past  Surgical History  Procedure Date  . Ankle arthroscopy w/ open repair   . Prostate surgery   . Tonsillectomy     Family History  Problem Relation Age of Onset  . Lung cancer    . Stroke Mother   . Diabetes Mother   . Cancer Father     lung cancer    No Known Allergies  Current Outpatient Prescriptions on File Prior to Visit  Medication Sig Dispense Refill  . aspirin 81 MG tablet Take 81 mg by mouth daily.        . calcium-vitamin D (OSCAL WITH D) 500-200 MG-UNIT per tablet Take 1 tablet by mouth daily.        . Glucosamine-Chondroit-Vit C-Mn (GLUCOSAMINE 1500 COMPLEX) CAPS Take by mouth 2 (two) times daily.        . multivitamin (THERAGRAN) per tablet Take 1 tablet by mouth daily.        . Omega-3 Fatty Acids (FISH OIL) 1000 MG CAPS Take by mouth.        . TRICOR 145 MG tablet TAKE 1 TABLET EVERY DAY  30 tablet  9    BP 140/80  Pulse 76  Temp 98.2 F (36.8 C)  Resp 16  Ht 5\' 11"  (1.803 m)  Wt 228 lb (103.42 kg)  BMI 31.80 kg/m2       Objective:   Physical Exam  Constitutional: He is oriented to person, place, and time. He appears well-developed and well-nourished.  HENT:  Head: Normocephalic and atraumatic.  Eyes: Conjunctivae are normal. Pupils are equal, round, and reactive to light.  Neck: Normal range of motion. Neck supple.  Cardiovascular: Normal rate and regular rhythm.   Pulmonary/Chest: Effort normal and breath sounds normal.  Abdominal: Soft. Bowel sounds are normal.  Genitourinary: Rectum normal and prostate normal.  Neurological: He is alert and oriented to person, place, and time.          Assessment & Plan:  Medicare wellness examination and followup of surgery Monitor CBC differential for the postoperative anemia and replace iron as indicated.  His hypertension is stable on his current medications.  A lipid panel will be drawn today to assess control of his lipids with his current medications and supplement regimen He is on vitamin D and  calcium for a history of osteopenia His mood is stable Subjective:    Louis Kent is a 70 y.o. male who presents for Medicare Annual/Subsequent preventive examination.   Preventive Screening-Counseling & Management  Tobacco History  Smoking status  . Never Smoker   Smokeless tobacco  . Not on file    Problems Prior to Visit 1.   Current Problems (verified) Patient Active Problem List  Diagnoses  . VITAMIN B12 DEFICIENCY  . HYPERLIPIDEMIA  . MORBID OBESITY  . UNSPECIFIED ANEMIA  . 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 HYPERTENSION  . OSTEOARTHRITIS, HIP  . MUSCLE SPASM, LUMBAR REGION  . OSTEOPENIA  . EDEMA    Medications Prior to Visit Current Outpatient Prescriptions on File Prior to Visit  Medication Sig Dispense Refill  . aspirin 81 MG tablet Take 81 mg by mouth daily.        . calcium-vitamin D (OSCAL WITH D) 500-200 MG-UNIT per tablet Take 1 tablet by mouth daily.        . Glucosamine-Chondroit-Vit C-Mn (GLUCOSAMINE 1500 COMPLEX) CAPS Take by mouth 2 (two) times daily.        . multivitamin (THERAGRAN) per tablet Take 1 tablet by mouth daily.        . Omega-3 Fatty Acids (FISH OIL) 1000 MG CAPS Take by mouth.        . TRICOR 145 MG tablet TAKE 1 TABLET EVERY DAY  30 tablet  9    Current Medications (verified) Current Outpatient Prescriptions  Medication Sig Dispense Refill  . aspirin 81 MG tablet Take 81 mg by mouth daily.        . calcium-vitamin D (OSCAL WITH D) 500-200 MG-UNIT per tablet Take 1 tablet by mouth daily.        . Glucosamine-Chondroit-Vit C-Mn (GLUCOSAMINE 1500 COMPLEX) CAPS Take by mouth 2 (two) times daily.        . multivitamin (THERAGRAN) per tablet Take 1 tablet by mouth daily.        . Omega-3 Fatty Acids (FISH OIL) 1000 MG CAPS Take by mouth.        . polysaccharide iron (NIFEREX) 150 MG CAPS capsule Take 150 mg by mouth daily.        . TRICOR 145 MG tablet TAKE 1 TABLET EVERY DAY  30 tablet  9  . vitamin C (ASCORBIC ACID)  500 MG tablet Take 500 mg by mouth 2 (two) times daily.        . irbesartan (AVAPRO) 150 MG tablet TAKE 1 TABLET (150 MG TOTAL) BY MOUTH AT BEDTIME.  30 tablet  6  . Vitamin D, Ergocalciferol, (DRISDOL) 50000 UNITS CAPS TAKE 1 CAPSULE BY MOUTH ONCE A WEEK  4 capsule  2     Allergies (verified) Review of patient's allergies indicates no known allergies.   PAST HISTORY  Family History Family History  Problem Relation Age of Onset  . Lung cancer    . Stroke Mother   . Diabetes Mother   . Cancer Father     lung cancer    Social History History  Substance Use Topics  . Smoking status: Never Smoker   . Smokeless tobacco: Not on file  . Alcohol Use: Yes    Are there smokers in your home (other than you)?  No  Risk Factors Current exercise habits: physical therapy post hip replacements Dietary issues discussed: reviewed weight control  Cardiac risk factors: advanced age (older than 23 for men, 42 for women), dyslipidemia, hypertension, male gender and obesity (BMI >= 30 kg/m2).  Depression Screen (Note: if answer to either of the following is "Yes", a more complete depression screening is indicated)   Q1: Over the past two weeks, have you felt down, depressed or hopeless? No  Q2: Over the past two weeks, have you felt little interest or pleasure in doing things? No  Have you lost interest or pleasure in daily life? No  Do you often feel hopeless? No  Do you cry easily over simple problems? No  Activities of Daily Living In your present state of health, do you have any difficulty performing the following activities?:  Driving? No Managing money?  No Feeding yourself? No Getting from bed to chair? No Climbing a flight of stairs? No Preparing food and eating?: no Bathing or showering? No Getting dressed: No Getting to the toilet? No Using the toilet:No Moving around from place to place: No In the past year have you fallen or had a near fall?:No   Are you sexually  active?  No  Do you have more than one partner?  No  Hearing Difficulties: No Do you often ask people to speak up or repeat themselves? No Do you experience ringing or noises in your ears? No Do you have difficulty understanding soft or whispered voices? No   Do you feel that you have a problem with memory? No  Do you often misplace items? No  Do you feel safe at home?  Yes  Cognitive Testing  Alert? Yes  Normal Appearance?Yes  Oriented to person? Yes  Place? Yes   Time? Yes  Recall of three objects?  Yes  Can perform simple calculations? Yes  Displays appropriate judgment?Yes  Can read the correct time from a watch face?Yes   Advanced Directives have been discussed with the patient? Yes   List the Names of Other Physician/Practitioners you currently use: 1.  orthopedic surgeon  Indicate any recent Medical Services you may have received from other than [facility] providers in the past year (date may be approximate).  Immunization History  Administered Date(s) Administered  . Influenza Split 02/17/2011  . Influenza Whole 05/16/2004, 03/16/2007, 01/30/2008, 03/10/2009, 02/04/2010  . Pneumococcal Polysaccharide 05/16/2004  . Td 05/16/2002  . Zoster 02/04/2010    Screening Tests Health Maintenance  Topic Date Due  . Influenza Vaccine  02/14/2012  . Tetanus/tdap  05/16/2012  . Colonoscopy  10/09/2017  . Pneumococcal Polysaccharide Vaccine Age 98 And Over  Completed  . Zostavax  Completed    All answers were reviewed with the patient and necessary referrals were made:  Carrie Mew   05/16/2011   History reviewed: allergies, current medications, past family history, past medical history, past social history,  past surgical history and problem list  Review of Systems A comprehensive review of systems was negative.    Objective:     Vision by Snellen chart: right eye:20/20, left eye:20/20 Blood pressure 140/80, pulse 76, temperature 98.2 F (36.8 C), resp. rate 16,  height 5\' 11"  (1.803 m), weight 228 lb (103.42 kg). Body mass index is 31.80 kg/(m^2).  BP 140/80  Pulse 76  Temp 98.2 F (36.8 C)  Resp 16  Ht 5\' 11"  (1.803 m)  Wt 228 lb (103.42 kg)  BMI 31.80 kg/m2  General Appearance:    Alert, cooperative, no distress, appears stated age  Head:    Normocephalic, without obvious abnormality, atraumatic  Eyes:    PERRL, conjunctiva/corneas clear, EOM's intact, fundi    benign, both eyes       Ears:    Normal TM's and external ear canals, both ears  Nose:   Nares normal, septum midline, mucosa normal, no drainage    or sinus tenderness  Throat:   Lips, mucosa, and tongue normal; teeth and gums normal  Neck:   Supple, symmetrical, trachea midline, no adenopathy;       thyroid:  No enlargement/tenderness/nodules; no carotid   bruit or JVD  Back:     Symmetric, no curvature, ROM normal, no CVA tenderness  Lungs:     Clear to auscultation bilaterally, respirations unlabored  Chest wall:    No tenderness or deformity  Heart:    Regular rate and rhythm, S1 and S2 normal, no murmur, rub   or gallop  Abdomen:     Soft, non-tender, bowel sounds active all four quadrants,    no masses, no organomegaly  Genitalia:    Normal male without lesion, discharge or tenderness  Rectal:    Normal tone, normal prostate, no masses or tenderness;   guaiac negative stool  Extremities:   Extremities normal, atraumatic, no cyanosis or edema  Pulses:   2+ and symmetric all extremities  Skin:   Skin color, texture, turgor normal, no rashes or lesions  Lymph nodes:   Cervical, supraclavicular, and axillary nodes normal  Neurologic:   CNII-XII intact. Normal strength, sensation and reflexes      throughout       Assessment:      Patient presents for yearly preventative medicine examination.   all immunizations and health maintenance protocols were reviewed with the patient and they are up to date with these protocols.   screening laboratory values were reviewed  with the patient including screening of hyperlipidemia PSA renal function and hepatic function.   There medications past medical history social history problem list and allergies were reviewed in detail.   Goals were established with regard to weight loss exercise diet in compliance with medications      Plan:     During the course of the visit the patient was educated and counseled about appropriate screening and preventive services including:    Influenza vaccine  Prostate cancer screening  Diabetes screening  Diet review for nutrition referral? Yes ____  Not Indicated __x__   Patient Instructions (the written plan) was given to the patient.  Medicare Attestation I have personally reviewed: The patient's medical and social history Their use of alcohol, tobacco or illicit drugs Their current medications and supplements The patient's functional ability including ADLs,fall risks, home safety risks, cognitive, and hearing and visual impairment Diet and physical activities Evidence for depression or mood disorders  The patient's weight, height, BMI, and visual acuity have  been recorded in the chart.  I have made referrals, counseling, and provided education to the patient based on review of the above and I have provided the patient with a written personalized care plan for preventive services.     Darryll Capers EDWARD   05/16/2011

## 2011-02-17 NOTE — Patient Instructions (Signed)
Patient was instructed to continue all medications as prescribed. To stop at the checkout desk and schedule a followup appointment  

## 2011-03-03 DIAGNOSIS — M169 Osteoarthritis of hip, unspecified: Secondary | ICD-10-CM | POA: Insufficient documentation

## 2011-05-03 ENCOUNTER — Other Ambulatory Visit: Payer: Self-pay | Admitting: Internal Medicine

## 2011-05-14 ENCOUNTER — Other Ambulatory Visit: Payer: Self-pay | Admitting: Internal Medicine

## 2011-06-22 ENCOUNTER — Ambulatory Visit (INDEPENDENT_AMBULATORY_CARE_PROVIDER_SITE_OTHER): Payer: Medicare Other | Admitting: Internal Medicine

## 2011-06-22 ENCOUNTER — Encounter: Payer: Self-pay | Admitting: Internal Medicine

## 2011-06-22 DIAGNOSIS — I10 Essential (primary) hypertension: Secondary | ICD-10-CM

## 2011-06-22 DIAGNOSIS — M810 Age-related osteoporosis without current pathological fracture: Secondary | ICD-10-CM

## 2011-06-22 DIAGNOSIS — E785 Hyperlipidemia, unspecified: Secondary | ICD-10-CM | POA: Diagnosis not present

## 2011-06-22 DIAGNOSIS — D649 Anemia, unspecified: Secondary | ICD-10-CM

## 2011-06-22 LAB — CBC WITH DIFFERENTIAL/PLATELET
Basophils Absolute: 0 10*3/uL (ref 0.0–0.1)
Basophils Relative: 0.8 % (ref 0.0–3.0)
HCT: 40.2 % (ref 39.0–52.0)
Hemoglobin: 13.6 g/dL (ref 13.0–17.0)
Lymphs Abs: 1.6 10*3/uL (ref 0.7–4.0)
Monocytes Relative: 8.7 % (ref 3.0–12.0)
Neutro Abs: 3 10*3/uL (ref 1.4–7.7)
RBC: 4.39 Mil/uL (ref 4.22–5.81)
RDW: 13.9 % (ref 11.5–14.6)

## 2011-06-22 NOTE — Patient Instructions (Signed)
The patient is instructed to continue all medications as prescribed. Schedule followup with check out clerk upon leaving the clinic  

## 2011-06-22 NOTE — Progress Notes (Signed)
Subjective:    Patient ID: Louis Kent, male    DOB: 08-Mar-1941, 71 y.o.   MRN: [id number]  HPI This is a 71 year old male who presents for followup of hyperlipidemia hypertension orthopedic surgery with hip replacement.  Today we discussed calcium supplementation and his history of vitamin D deficiency.  His last vitamin D level was 38 current best practices he is to keep her vitamin D level between 30 and 50.  He does have a history of unspecified anemia or iron deficiency anemia following surgery and that should be monitored today.  Blood pressure on second check was within normal range and within the goal of therapy continue current therapy recommended.     Review of Systems  Constitutional: Negative for fever and fatigue.  HENT: Negative for hearing loss, congestion, neck pain and postnasal drip.   Eyes: Negative for discharge, redness and visual disturbance.  Respiratory: Negative for cough, shortness of breath and wheezing.   Cardiovascular: Negative for leg swelling.  Gastrointestinal: Negative for abdominal pain, constipation and abdominal distention.  Genitourinary: Negative for urgency and frequency.  Musculoskeletal: Negative for joint swelling and arthralgias.  Skin: Negative for color change and rash.  Neurological: Negative for weakness and light-headedness.  Hematological: Negative for adenopathy.  Psychiatric/Behavioral: Negative for behavioral problems.   Past Medical History  Diagnosis Date  . Hyperlipidemia   . Hypertension   . S/P prostatectomy     History   Social History  . Marital Status: Widowed    Spouse Name: N/A    Number of Children: N/A  . Years of Education: N/A   Occupational History  . retired    Social History Main Topics  . Smoking status: Never Smoker   . Smokeless tobacco: Not on file  . Alcohol Use: Yes  . Drug Use: No  . Sexually Active: Not Currently   Other Topics Concern  . Not on file   Social History Narrative   . No narrative on file    Past Surgical History  Procedure Date  . Ankle arthroscopy w/ open repair   . Prostate surgery   . Tonsillectomy     Family History  Problem Relation Age of Onset  . Lung cancer    . Stroke Mother   . Diabetes Mother   . Cancer Father     lung cancer    No Known Allergies  Current Outpatient Prescriptions on File Prior to Visit  Medication Sig Dispense Refill  . aspirin 81 MG tablet Take 81 mg by mouth daily.        . calcium-vitamin D (OSCAL WITH D) 500-200 MG-UNIT per tablet Take 1 tablet by mouth daily.        . irbesartan (AVAPRO) 150 MG tablet TAKE 1 TABLET (150 MG TOTAL) BY MOUTH AT BEDTIME.  30 tablet  6  . multivitamin (THERAGRAN) per tablet Take 1 tablet by mouth daily.        . Omega-3 Fatty Acids (FISH OIL) 1000 MG CAPS Take by mouth.        . TRICOR 145 MG tablet TAKE 1 TABLET EVERY DAY  30 tablet  9  . Vitamin D, Ergocalciferol, (DRISDOL) 50000 UNITS CAPS TAKE 1 CAPSULE BY MOUTH ONCE A WEEK  4 capsule  2    BP 138/80  Pulse 72  Temp 98 F (36.7 C)  Resp 16  Ht 5\' 11"  (1.803 m)  Wt 234 lb (106.142 kg)  BMI 32.64 kg/m2  Objective:   Physical Exam  Constitutional: He appears well-developed and well-nourished.  HENT:  Head: Normocephalic and atraumatic.  Eyes: Conjunctivae are normal. Pupils are equal, round, and reactive to light.  Neck: Normal range of motion. Neck supple.  Cardiovascular: Normal rate and regular rhythm.   Pulmonary/Chest: Effort normal and breath sounds normal.  Abdominal: Soft. Bowel sounds are normal.          Assessment & Plan:  Monitoring CBC differential today for his history of unspecified anemia  Hypertension stable on current medications.  Status post total hip replacement doing well with physical therapy.  7 for complete physical examination and monitoring in October. There

## 2011-07-08 ENCOUNTER — Other Ambulatory Visit: Payer: Self-pay | Admitting: Internal Medicine

## 2011-08-28 ENCOUNTER — Other Ambulatory Visit: Payer: Self-pay | Admitting: Internal Medicine

## 2011-11-28 ENCOUNTER — Other Ambulatory Visit: Payer: Self-pay | Admitting: Internal Medicine

## 2012-01-05 ENCOUNTER — Other Ambulatory Visit: Payer: Self-pay | Admitting: Internal Medicine

## 2012-02-01 DIAGNOSIS — Z471 Aftercare following joint replacement surgery: Secondary | ICD-10-CM | POA: Diagnosis not present

## 2012-02-01 DIAGNOSIS — M169 Osteoarthritis of hip, unspecified: Secondary | ICD-10-CM | POA: Diagnosis not present

## 2012-02-01 DIAGNOSIS — Z96649 Presence of unspecified artificial hip joint: Secondary | ICD-10-CM | POA: Diagnosis not present

## 2012-02-01 DIAGNOSIS — M069 Rheumatoid arthritis, unspecified: Secondary | ICD-10-CM | POA: Diagnosis not present

## 2012-02-03 DIAGNOSIS — H2510 Age-related nuclear cataract, unspecified eye: Secondary | ICD-10-CM | POA: Diagnosis not present

## 2012-02-03 DIAGNOSIS — H26019 Infantile and juvenile cortical, lamellar, or zonular cataract, unspecified eye: Secondary | ICD-10-CM | POA: Diagnosis not present

## 2012-02-13 DIAGNOSIS — L578 Other skin changes due to chronic exposure to nonionizing radiation: Secondary | ICD-10-CM | POA: Diagnosis not present

## 2012-02-13 DIAGNOSIS — L570 Actinic keratosis: Secondary | ICD-10-CM | POA: Diagnosis not present

## 2012-02-16 ENCOUNTER — Other Ambulatory Visit: Payer: Self-pay | Admitting: Internal Medicine

## 2012-02-22 ENCOUNTER — Ambulatory Visit: Payer: Medicare Other | Admitting: Internal Medicine

## 2012-03-02 DIAGNOSIS — C61 Malignant neoplasm of prostate: Secondary | ICD-10-CM | POA: Diagnosis not present

## 2012-03-07 DIAGNOSIS — C61 Malignant neoplasm of prostate: Secondary | ICD-10-CM | POA: Diagnosis not present

## 2012-03-22 ENCOUNTER — Ambulatory Visit (INDEPENDENT_AMBULATORY_CARE_PROVIDER_SITE_OTHER): Payer: Medicare Other | Admitting: Internal Medicine

## 2012-03-22 VITALS — BP 140/80 | HR 64 | Temp 98.3°F | Resp 16 | Ht 71.0 in | Wt 234.0 lb

## 2012-03-22 DIAGNOSIS — E559 Vitamin D deficiency, unspecified: Secondary | ICD-10-CM | POA: Diagnosis not present

## 2012-03-22 DIAGNOSIS — E785 Hyperlipidemia, unspecified: Secondary | ICD-10-CM | POA: Diagnosis not present

## 2012-03-22 DIAGNOSIS — K439 Ventral hernia without obstruction or gangrene: Secondary | ICD-10-CM | POA: Diagnosis not present

## 2012-03-22 DIAGNOSIS — Z23 Encounter for immunization: Secondary | ICD-10-CM | POA: Diagnosis not present

## 2012-03-22 LAB — BASIC METABOLIC PANEL
BUN: 20 mg/dL (ref 6–23)
CO2: 26 mEq/L (ref 19–32)
Chloride: 102 mEq/L (ref 96–112)
Creatinine, Ser: 1.2 mg/dL (ref 0.4–1.5)
Glucose, Bld: 85 mg/dL (ref 70–99)

## 2012-03-22 LAB — LIPID PANEL
Cholesterol: 194 mg/dL (ref 0–200)
LDL Cholesterol: 103 mg/dL — ABNORMAL HIGH (ref 0–99)
Triglycerides: 131 mg/dL (ref 0.0–149.0)

## 2012-03-22 NOTE — Progress Notes (Signed)
Subjective:    Patient ID: Louis Kent, male    DOB: 1941-02-17, 71 y.o.   MRN: [id number]  HPI Follow up for lipid and HTN mangement No CV complaints   Review of Systems  Constitutional: Negative for fever and fatigue.  HENT: Negative for hearing loss, congestion, neck pain and postnasal drip.   Eyes: Negative for discharge, redness and visual disturbance.  Respiratory: Negative for cough, shortness of breath and wheezing.   Cardiovascular: Negative for leg swelling.  Gastrointestinal: Negative for abdominal pain, constipation and abdominal distention.  Genitourinary: Negative for urgency and frequency.  Musculoskeletal: Negative for joint swelling and arthralgias.  Skin: Negative for color change and rash.  Neurological: Negative for weakness and light-headedness.  Hematological: Negative for adenopathy.  Psychiatric/Behavioral: Negative for behavioral problems.   Past Medical History  Diagnosis Date  . Hyperlipidemia   . Hypertension   . S/P prostatectomy     History   Social History  . Marital Status: Widowed    Spouse Name: N/A    Number of Children: N/A  . Years of Education: N/A   Occupational History  . retired    Social History Main Topics  . Smoking status: Never Smoker   . Smokeless tobacco: Not on file  . Alcohol Use: Yes  . Drug Use: No  . Sexually Active: Not Currently   Other Topics Concern  . Not on file   Social History Narrative  . No narrative on file    Past Surgical History  Procedure Date  . Ankle arthroscopy w/ open repair   . Prostate surgery   . Tonsillectomy     Family History  Problem Relation Age of Onset  . Lung cancer    . Stroke Mother   . Diabetes Mother   . Cancer Father     lung cancer    No Known Allergies  Current Outpatient Prescriptions on File Prior to Visit  Medication Sig Dispense Refill  . aspirin 81 MG tablet Take 81 mg by mouth daily.        . calcium-vitamin D (OSCAL WITH D) 500-200 MG-UNIT  per tablet Take 1 tablet by mouth daily.        . irbesartan (AVAPRO) 150 MG tablet TAKE 1 TABLET (150 MG TOTAL) BY MOUTH AT BEDTIME.  30 tablet  6  . multivitamin (THERAGRAN) per tablet Take 1 tablet by mouth daily.        . Omega-3 Fatty Acids (FISH OIL) 1000 MG CAPS Take by mouth.        . fenofibrate (TRICOR) 145 MG tablet TAKE 1 TABLET EVERY DAY  30 tablet  9    BP 140/80  Pulse 64  Temp 98.3 F (36.8 C)  Resp 16  Ht 5\' 11"  (1.803 m)  Wt 234 lb (106.142 kg)  BMI 32.64 kg/m2   '    Objective:   Physical Exam  Nursing note and vitals reviewed. Constitutional: He is oriented to person, place, and time. He appears well-developed and well-nourished.  HENT:  Head: Normocephalic and atraumatic.  Eyes: Conjunctivae normal are normal. Pupils are equal, round, and reactive to light.  Neck: Normal range of motion. Neck supple.  Cardiovascular: Normal rate and regular rhythm.   Murmur heard. Pulmonary/Chest: Effort normal and breath sounds normal.  Abdominal: Soft. Bowel sounds are normal.  Musculoskeletal: Normal range of motion.  Neurological: He is alert and oriented to person, place, and time.  Skin: Skin is warm and dry.  Assessment & Plan:  Controlled HTN Lipid reviewed  Based on HDL and lDL changes we discussed need to keep up arobic exercise

## 2012-03-22 NOTE — Patient Instructions (Signed)
"  practical paleo" 

## 2012-05-03 ENCOUNTER — Other Ambulatory Visit: Payer: Self-pay | Admitting: Internal Medicine

## 2012-05-12 ENCOUNTER — Other Ambulatory Visit: Payer: Self-pay | Admitting: Internal Medicine

## 2012-05-28 ENCOUNTER — Encounter: Payer: Self-pay | Admitting: Internal Medicine

## 2012-07-04 DIAGNOSIS — L578 Other skin changes due to chronic exposure to nonionizing radiation: Secondary | ICD-10-CM | POA: Diagnosis not present

## 2012-07-25 ENCOUNTER — Ambulatory Visit (INDEPENDENT_AMBULATORY_CARE_PROVIDER_SITE_OTHER): Payer: Medicare Other | Admitting: Internal Medicine

## 2012-07-25 ENCOUNTER — Encounter: Payer: Self-pay | Admitting: Internal Medicine

## 2012-07-25 VITALS — BP 154/90 | HR 68 | Temp 98.3°F | Resp 16 | Ht 71.0 in | Wt 238.0 lb

## 2012-07-25 DIAGNOSIS — I10 Essential (primary) hypertension: Secondary | ICD-10-CM

## 2012-07-25 DIAGNOSIS — E785 Hyperlipidemia, unspecified: Secondary | ICD-10-CM | POA: Diagnosis not present

## 2012-07-25 MED ORDER — IRBESARTAN 300 MG PO TABS: 300.0000 mg | ORAL_TABLET | Freq: Every day | ORAL | Status: DC

## 2012-07-25 NOTE — Patient Instructions (Addendum)
The patient is instructed to continue all medications as prescribed. Schedule followup with check out clerk upon leaving the clinic If you loose 10 pounds may resume the 150 mg dose

## 2012-07-25 NOTE — Progress Notes (Signed)
Subjective:    Patient ID: Louis Kent, male    DOB: May 31, 1940, 72 y.o.   MRN: [id number]  HPI 10 pound trend Effect  Of weight of blood pressure Protein in age   Review of Systems  Constitutional: Negative for fever and fatigue.  HENT: Negative for hearing loss, congestion, neck pain and postnasal drip.   Eyes: Negative for discharge, redness and visual disturbance.  Respiratory: Negative for cough, shortness of breath and wheezing.   Cardiovascular: Negative for leg swelling.  Gastrointestinal: Negative for abdominal pain, constipation and abdominal distention.  Genitourinary: Negative for urgency and frequency.  Musculoskeletal: Negative for joint swelling and arthralgias.  Skin: Negative for color change and rash.  Neurological: Negative for weakness and light-headedness.  Hematological: Negative for adenopathy.  Psychiatric/Behavioral: Negative for behavioral problems.   Past Medical History  Diagnosis Date  . Hyperlipidemia   . Hypertension   . S/P prostatectomy     History   Social History  . Marital Status: Widowed    Spouse Name: N/A    Number of Children: N/A  . Years of Education: N/A   Occupational History  . retired    Social History Main Topics  . Smoking status: Never Smoker   . Smokeless tobacco: Not on file  . Alcohol Use: Yes  . Drug Use: No  . Sexually Active: Not Currently   Other Topics Concern  . Not on file   Social History Narrative  . No narrative on file    Past Surgical History  Procedure Laterality Date  . Ankle arthroscopy w/ open repair    . Prostate surgery    . Tonsillectomy      Family History  Problem Relation Age of Onset  . Lung cancer    . Stroke Mother   . Diabetes Mother   . Cancer Father     lung cancer    No Known Allergies  Current Outpatient Prescriptions on File Prior to Visit  Medication Sig Dispense Refill  . aspirin 81 MG tablet Take 81 mg by mouth daily.        . calcium-vitamin D (OSCAL  WITH D) 500-200 MG-UNIT per tablet Take 1 tablet by mouth daily.        . fenofibrate (TRICOR) 145 MG tablet TAKE 1 TABLET EVERY DAY  30 tablet  9  . irbesartan (AVAPRO) 150 MG tablet TAKE 1 TABLET (150 MG TOTAL) BY MOUTH AT BEDTIME.  30 tablet  6  . multivitamin (THERAGRAN) per tablet Take 1 tablet by mouth daily.        . Omega-3 Fatty Acids (FISH OIL) 1000 MG CAPS Take by mouth.        . Vitamin D, Ergocalciferol, (DRISDOL) 50000 UNITS CAPS TAKE 1 CAPSULE BY MOUTH ONCE A WEEK  4 capsule  2   No current facility-administered medications on file prior to visit.    BP 154/90  Pulse 68  Temp(Src) 98.3 F (36.8 C)  Resp 16  Ht 5\' 11"  (1.803 m)  Wt 238 lb (107.956 kg)  BMI 33.21 kg/m2       Objective:   Physical Exam  Constitutional: He appears well-developed and well-nourished.  HENT:  Head: Normocephalic and atraumatic.  Eyes: Conjunctivae are normal. Pupils are equal, round, and reactive to light.  Neck: Normal range of motion. Neck supple.  Cardiovascular: Normal rate, regular rhythm and intact distal pulses.   Pulmonary/Chest: Effort normal and breath sounds normal.  Abdominal: Soft. Bowel sounds are normal.  Assessment & Plan:  Food and diet Increased the avapro to the 300 discussed cognitive functioning

## 2012-08-11 ENCOUNTER — Other Ambulatory Visit: Payer: Self-pay | Admitting: Internal Medicine

## 2012-09-13 ENCOUNTER — Encounter: Payer: Self-pay | Admitting: Family Medicine

## 2012-09-13 ENCOUNTER — Ambulatory Visit (INDEPENDENT_AMBULATORY_CARE_PROVIDER_SITE_OTHER): Payer: Medicare Other | Admitting: Family Medicine

## 2012-09-13 VITALS — BP 150/80 | Temp 98.5°F | Wt 238.0 lb

## 2012-09-13 DIAGNOSIS — B029 Zoster without complications: Secondary | ICD-10-CM | POA: Diagnosis not present

## 2012-09-13 DIAGNOSIS — Z23 Encounter for immunization: Secondary | ICD-10-CM

## 2012-09-13 MED ORDER — VALACYCLOVIR HCL 1 G PO TABS: 1000.0000 mg | ORAL_TABLET | Freq: Three times a day (TID) | ORAL | Status: DC

## 2012-09-13 NOTE — Progress Notes (Signed)
Chief Complaint  Patient presents with  . Herpes Zoster    rash, painful, numbness, getting worse, from tailbone on left side down leg     HPI:  Acute visit for rash: -started having sensation of numbness in L buttock last week, then blisters developed 4 days ago and continues to get new lesions -burning rash on L bottock -denies: fevers, chills, NVD, CP, SOB ROS: See pertinent positives and negatives per HPI.  Past Medical History  Diagnosis Date  . Hyperlipidemia   . Hypertension   . S/P prostatectomy     Family History  Problem Relation Age of Onset  . Lung cancer    . Stroke Mother   . Diabetes Mother   . Cancer Father     lung cancer    History   Social History  . Marital Status: Widowed    Spouse Name: N/A    Number of Children: N/A  . Years of Education: N/A   Occupational History  . retired    Social History Main Topics  . Smoking status: Never Smoker   . Smokeless tobacco: None  . Alcohol Use: Yes  . Drug Use: No  . Sexually Active: Not Currently   Other Topics Concern  . None   Social History Narrative  . None    Current outpatient prescriptions:aspirin 81 MG tablet, Take 81 mg by mouth daily.  , Disp: , Rfl: ;  calcium-vitamin D (OSCAL WITH D) 500-200 MG-UNIT per tablet, Take 1 tablet by mouth daily.  , Disp: , Rfl: ;  fenofibrate (TRICOR) 145 MG tablet, TAKE 1 TABLET EVERY DAY, Disp: 30 tablet, Rfl: 9;  irbesartan (AVAPRO) 300 MG tablet, Take 1 tablet (300 mg total) by mouth daily., Disp: 30 tablet, Rfl: 6 multivitamin (THERAGRAN) per tablet, Take 1 tablet by mouth daily.  , Disp: , Rfl: ;  Omega-3 Fatty Acids (FISH OIL) 1000 MG CAPS, Take by mouth.  , Disp: , Rfl: ;  Vitamin D, Ergocalciferol, (DRISDOL) 50000 UNITS CAPS, TAKE 1 CAPSULE BY MOUTH ONCE A WEEK, Disp: 4 capsule, Rfl: 2;  valACYclovir (VALTREX) 1000 MG tablet, Take 1 tablet (1,000 mg total) by mouth 3 (three) times daily., Disp: 21 tablet, Rfl: 0  EXAM:  Filed Vitals:   09/13/12  1013  BP: 150/80  Temp: 98.5 F (36.9 C)    Body mass index is 33.21 kg/(m^2).  GENERAL: vitals reviewed and listed above, alert, oriented, appears well hydrated and in no acute distress  HEENT: atraumatic, conjunttiva clear, no obvious abnormalities on inspection of external nose and ears  NECK: no obvious masses on inspection  SKIN: erythematous vesicular rash L buttock to L thigh   MS: moves all extremities without noticeable abnormality  PSYCH: pleasant and cooperative, no obvious depression or anxiety  ASSESSMENT AND PLAN:  Discussed the following assessment and plan:  Shingles - Plan: valACYclovir (VALTREX) 1000 MG tablet  -discussed unknown benefit of antiviral > 72 hours after outbreak, but possible benefits if new lesions appearing indicated ongoing viral replication - pt wants to try antiviral after discussion risks. Rx sent. Follow up as needed. -handout from [facility] provided -Patient advised to return or notify a doctor immediately if symptoms worsen or persist or new concerns arise.  There are no Patient Instructions on file for this visit.   Kriste Basque R.

## 2012-11-09 ENCOUNTER — Other Ambulatory Visit: Payer: Self-pay | Admitting: Internal Medicine

## 2012-11-28 ENCOUNTER — Encounter: Payer: Self-pay | Admitting: Internal Medicine

## 2012-11-28 ENCOUNTER — Ambulatory Visit (INDEPENDENT_AMBULATORY_CARE_PROVIDER_SITE_OTHER): Payer: Medicare Other | Admitting: Internal Medicine

## 2012-11-28 VITALS — BP 136/80 | HR 72 | Temp 98.2°F | Resp 16 | Ht 71.0 in | Wt 232.0 lb

## 2012-11-28 DIAGNOSIS — T887XXA Unspecified adverse effect of drug or medicament, initial encounter: Secondary | ICD-10-CM

## 2012-11-28 DIAGNOSIS — N139 Obstructive and reflux uropathy, unspecified: Secondary | ICD-10-CM

## 2012-11-28 DIAGNOSIS — Z8546 Personal history of malignant neoplasm of prostate: Secondary | ICD-10-CM | POA: Diagnosis not present

## 2012-11-28 DIAGNOSIS — N401 Enlarged prostate with lower urinary tract symptoms: Secondary | ICD-10-CM | POA: Diagnosis not present

## 2012-11-28 DIAGNOSIS — E785 Hyperlipidemia, unspecified: Secondary | ICD-10-CM | POA: Diagnosis not present

## 2012-11-28 DIAGNOSIS — E559 Vitamin D deficiency, unspecified: Secondary | ICD-10-CM

## 2012-11-28 DIAGNOSIS — N138 Other obstructive and reflux uropathy: Secondary | ICD-10-CM

## 2012-11-28 DIAGNOSIS — I10 Essential (primary) hypertension: Secondary | ICD-10-CM

## 2012-11-28 NOTE — Patient Instructions (Addendum)
Mild post herpetic neuralgia  Lysine 500 BID B12 dots 500 mg one a day For a month

## 2012-11-28 NOTE — Progress Notes (Signed)
Subjective:    Patient ID: Louis Kent, male    DOB: 1940/06/28, 72 y.o.   MRN: [id number]  HPI    Review of Systems  Constitutional: Negative for fever and fatigue.  HENT: Negative for hearing loss, congestion, neck pain and postnasal drip.   Eyes: Negative for discharge, redness and visual disturbance.  Respiratory: Negative for cough, shortness of breath and wheezing.   Cardiovascular: Negative for leg swelling.  Gastrointestinal: Negative for abdominal pain, constipation and abdominal distention.  Genitourinary: Negative for urgency and frequency.  Musculoskeletal: Negative for joint swelling and arthralgias.  Skin: Negative for color change and rash.  Neurological: Negative for weakness and light-headedness.  Hematological: Negative for adenopathy.  Psychiatric/Behavioral: Negative for behavioral problems.   Past Medical History  Diagnosis Date  . Hyperlipidemia   . Hypertension   . S/P prostatectomy     History   Social History  . Marital Status: Widowed    Spouse Name: N/A    Number of Children: N/A  . Years of Education: N/A   Occupational History  . retired    Social History Main Topics  . Smoking status: Never Smoker   . Smokeless tobacco: Not on file  . Alcohol Use: Yes  . Drug Use: No  . Sexually Active: Not Currently   Other Topics Concern  . Not on file   Social History Narrative  . No narrative on file    Past Surgical History  Procedure Laterality Date  . Ankle arthroscopy w/ open repair    . Prostate surgery    . Tonsillectomy      Family History  Problem Relation Age of Onset  . Lung cancer    . Stroke Mother   . Diabetes Mother   . Cancer Father     lung cancer    No Known Allergies  Current Outpatient Prescriptions on File Prior to Visit  Medication Sig Dispense Refill  . aspirin 81 MG tablet Take 81 mg by mouth daily.        . calcium-vitamin D (OSCAL WITH D) 500-200 MG-UNIT per tablet Take 1 tablet by mouth daily.         . fenofibrate (TRICOR) 145 MG tablet TAKE 1 TABLET EVERY DAY  30 tablet  9  . irbesartan (AVAPRO) 300 MG tablet Take 1 tablet (300 mg total) by mouth daily.  30 tablet  6  . multivitamin (THERAGRAN) per tablet Take 1 tablet by mouth daily.        . Omega-3 Fatty Acids (FISH OIL) 1000 MG CAPS Take by mouth.        . Vitamin D, Ergocalciferol, (DRISDOL) 50000 UNITS CAPS TAKE 1 CAPSULE BY MOUTH ONCE A WEEK  4 capsule  5   No current facility-administered medications on file prior to visit.    BP 136/80  Pulse 72  Temp(Src) 98.2 F (36.8 C)  Resp 16  Ht 5\' 11"  (1.803 m)  Wt 232 lb (105.235 kg)  BMI 32.37 kg/m2       Objective:   Physical Exam  Nursing note and vitals reviewed. Constitutional: He appears well-developed and well-nourished.  HENT:  Head: Normocephalic and atraumatic.  Eyes: Conjunctivae are normal. Pupils are equal, round, and reactive to light.  Neck: Normal range of motion. Neck supple.  Cardiovascular: Normal rate and regular rhythm.   Pulmonary/Chest: Effort normal and breath sounds normal.  Abdominal: Soft. Bowel sounds are normal.          Assessment & Plan:  Follow up lipids HTN stable  Lipid monitoring Post shingles and discussion of vaccine Site was lower back

## 2013-01-31 DIAGNOSIS — Z471 Aftercare following joint replacement surgery: Secondary | ICD-10-CM | POA: Diagnosis not present

## 2013-01-31 DIAGNOSIS — Z96649 Presence of unspecified artificial hip joint: Secondary | ICD-10-CM | POA: Diagnosis not present

## 2013-01-31 DIAGNOSIS — M169 Osteoarthritis of hip, unspecified: Secondary | ICD-10-CM | POA: Diagnosis not present

## 2013-02-14 DIAGNOSIS — H2510 Age-related nuclear cataract, unspecified eye: Secondary | ICD-10-CM | POA: Diagnosis not present

## 2013-02-25 ENCOUNTER — Other Ambulatory Visit: Payer: Self-pay | Admitting: Internal Medicine

## 2013-03-03 ENCOUNTER — Other Ambulatory Visit: Payer: Self-pay | Admitting: Internal Medicine

## 2013-03-05 ENCOUNTER — Ambulatory Visit (INDEPENDENT_AMBULATORY_CARE_PROVIDER_SITE_OTHER): Payer: Medicare Other

## 2013-03-05 DIAGNOSIS — Z23 Encounter for immunization: Secondary | ICD-10-CM

## 2013-03-06 DIAGNOSIS — C61 Malignant neoplasm of prostate: Secondary | ICD-10-CM | POA: Diagnosis not present

## 2013-03-13 DIAGNOSIS — N529 Male erectile dysfunction, unspecified: Secondary | ICD-10-CM | POA: Diagnosis not present

## 2013-03-13 DIAGNOSIS — C61 Malignant neoplasm of prostate: Secondary | ICD-10-CM | POA: Diagnosis not present

## 2013-04-03 ENCOUNTER — Other Ambulatory Visit (INDEPENDENT_AMBULATORY_CARE_PROVIDER_SITE_OTHER): Payer: Medicare Other

## 2013-04-03 DIAGNOSIS — T887XXA Unspecified adverse effect of drug or medicament, initial encounter: Secondary | ICD-10-CM | POA: Diagnosis not present

## 2013-04-03 DIAGNOSIS — E785 Hyperlipidemia, unspecified: Secondary | ICD-10-CM

## 2013-04-03 DIAGNOSIS — E559 Vitamin D deficiency, unspecified: Secondary | ICD-10-CM | POA: Diagnosis not present

## 2013-04-03 DIAGNOSIS — Z8546 Personal history of malignant neoplasm of prostate: Secondary | ICD-10-CM

## 2013-04-03 DIAGNOSIS — I10 Essential (primary) hypertension: Secondary | ICD-10-CM | POA: Diagnosis not present

## 2013-04-03 LAB — POCT URINALYSIS DIPSTICK
Blood, UA: NEGATIVE
Glucose, UA: NEGATIVE
Ketones, UA: NEGATIVE
Leukocytes, UA: NEGATIVE
Protein, UA: NEGATIVE
Spec Grav, UA: 1.025
Urobilinogen, UA: 0.2

## 2013-04-03 LAB — CBC WITH DIFFERENTIAL/PLATELET
Basophils Relative: 1 % (ref 0.0–3.0)
Eosinophils Absolute: 0.2 10*3/uL (ref 0.0–0.7)
Eosinophils Relative: 4.6 % (ref 0.0–5.0)
HCT: 39.8 % (ref 39.0–52.0)
Hemoglobin: 13.5 g/dL (ref 13.0–17.0)
Lymphs Abs: 1.5 10*3/uL (ref 0.7–4.0)
MCV: 90.3 fl (ref 78.0–100.0)
Monocytes Absolute: 0.4 10*3/uL (ref 0.1–1.0)
Neutro Abs: 3.1 10*3/uL (ref 1.4–7.7)
Neutrophils Relative %: 57.7 % (ref 43.0–77.0)
RBC: 4.41 Mil/uL (ref 4.22–5.81)
WBC: 5.3 10*3/uL (ref 4.5–10.5)

## 2013-04-03 LAB — BASIC METABOLIC PANEL
CO2: 26 mEq/L (ref 19–32)
Chloride: 107 mEq/L (ref 96–112)
Glucose, Bld: 103 mg/dL — ABNORMAL HIGH (ref 70–99)
Potassium: 4.1 mEq/L (ref 3.5–5.1)
Sodium: 140 mEq/L (ref 135–145)

## 2013-04-03 LAB — HEPATIC FUNCTION PANEL
ALT: 28 U/L (ref 0–53)
AST: 26 U/L (ref 0–37)
Albumin: 4 g/dL (ref 3.5–5.2)
Alkaline Phosphatase: 53 U/L (ref 39–117)
Total Protein: 6.3 g/dL (ref 6.0–8.3)

## 2013-04-03 LAB — PSA: PSA: 0.01 ng/mL — ABNORMAL LOW (ref 0.10–4.00)

## 2013-04-10 ENCOUNTER — Ambulatory Visit (INDEPENDENT_AMBULATORY_CARE_PROVIDER_SITE_OTHER): Payer: Medicare Other | Admitting: Internal Medicine

## 2013-04-10 ENCOUNTER — Encounter: Payer: Self-pay | Admitting: Internal Medicine

## 2013-04-10 VITALS — BP 140/84 | HR 72 | Temp 98.2°F | Resp 16 | Ht 71.0 in | Wt 234.0 lb

## 2013-04-10 DIAGNOSIS — H6123 Impacted cerumen, bilateral: Secondary | ICD-10-CM

## 2013-04-10 DIAGNOSIS — E785 Hyperlipidemia, unspecified: Secondary | ICD-10-CM | POA: Diagnosis not present

## 2013-04-10 DIAGNOSIS — H6120 Impacted cerumen, unspecified ear: Secondary | ICD-10-CM

## 2013-04-10 DIAGNOSIS — I10 Essential (primary) hypertension: Secondary | ICD-10-CM

## 2013-04-10 DIAGNOSIS — Z0000 Encounter for general adult medical examination without abnormal findings: Secondary | ICD-10-CM

## 2013-04-10 NOTE — Progress Notes (Signed)
Pre visit review using our clinic review tool, if applicable. No additional management support is needed unless otherwise documented below in the visit note. 

## 2013-04-10 NOTE — Progress Notes (Signed)
   Subjective:    Patient ID: Louis Kent, male    DOB: 1941-01-24, 72 y.o.   MRN: [id number]  HPI CPX, welness   Review of Systems  Constitutional: Negative for fever and fatigue.  HENT: Negative for congestion, hearing loss and postnasal drip.   Eyes: Negative for discharge, redness and visual disturbance.  Respiratory: Negative for cough, shortness of breath and wheezing.   Cardiovascular: Negative for leg swelling.  Gastrointestinal: Negative for abdominal pain, constipation and abdominal distention.  Genitourinary: Negative for urgency and frequency.  Musculoskeletal: Negative for arthralgias, joint swelling and neck pain.  Skin: Negative for color change and rash.  Neurological: Negative for weakness and light-headedness.  Hematological: Negative for adenopathy.  Psychiatric/Behavioral: Negative for behavioral problems.       Objective:   Physical Exam  Nursing note and vitals reviewed. Constitutional: He is oriented to person, place, and time. He appears well-developed and well-nourished.  HENT:  Head: Normocephalic and atraumatic.  Bilateral wax impaction  Eyes: Conjunctivae are normal. Pupils are equal, round, and reactive to light.  Neck: Normal range of motion. Neck supple.  Cardiovascular: Normal rate and regular rhythm.   Pulmonary/Chest: Effort normal and breath sounds normal.  Abdominal: Soft. Bowel sounds are normal.  Genitourinary: Rectum normal.  Musculoskeletal: Normal range of motion. He exhibits tenderness.  Neurological: He is alert and oriented to person, place, and time.  Skin: Skin is warm and dry.  Psychiatric: He has a normal mood and affect. His behavior is normal.          Assessment & Plan:   Patient presents for yearly preventative medicine examination.   all immunizations and health maintenance protocols were reviewed with the patient and they are up to date with these protocols.   screening laboratory values were reviewed with  the patient including screening of hyperlipidemia PSA renal function and hepatic function.   There medications past medical history social history problem list and allergies were reviewed in detail.   Goals were established with regard to weight loss exercise diet in compliance with medications    Informed consent was obtained and peroxide gel was inserted into the ears bilaterally using the lavage kit the ears were lavaged until clean.Inspection with a cerumen spoon removed residual wax. Patient tolerated the procedure well.

## 2013-04-10 NOTE — Patient Instructions (Signed)
The patient is instructed to continue all medications as prescribed. Schedule followup with check out clerk upon leaving the clinic  

## 2013-05-10 ENCOUNTER — Other Ambulatory Visit: Payer: Self-pay | Admitting: Internal Medicine

## 2013-09-30 ENCOUNTER — Telehealth: Payer: Self-pay | Admitting: Internal Medicine

## 2013-09-30 NOTE — Telephone Encounter (Signed)
Pt's was offered Louis Kent or Louis Kent for his f/u appointment on 10/16/13, pt refused.  He states he would like a callback from Dr. Arnoldo Morale to discuss who he should f/u with.  Pt states he wants to talk to Dr. Arnoldo Morale and then research the physician first.

## 2013-10-03 ENCOUNTER — Other Ambulatory Visit (INDEPENDENT_AMBULATORY_CARE_PROVIDER_SITE_OTHER): Payer: Medicare Other

## 2013-10-03 DIAGNOSIS — E785 Hyperlipidemia, unspecified: Secondary | ICD-10-CM

## 2013-10-03 DIAGNOSIS — I10 Essential (primary) hypertension: Secondary | ICD-10-CM | POA: Diagnosis not present

## 2013-10-03 LAB — LIPID PANEL
CHOL/HDL RATIO: 3
Cholesterol: 187 mg/dL (ref 0–200)
HDL: 67.4 mg/dL (ref >39.00)
LDL Cholesterol: 98 mg/dL (ref 0–99)
TRIGLYCERIDES: 106 mg/dL (ref 0.0–149.0)
VLDL: 21.2 mg/dL (ref 0.0–40.0)

## 2013-10-03 LAB — HEPATIC FUNCTION PANEL
ALBUMIN: 4 g/dL (ref 3.5–5.2)
ALT: 31 U/L (ref 0–53)
AST: 35 U/L (ref 0–37)
Alkaline Phosphatase: 48 U/L (ref 39–117)
Bilirubin, Direct: 0 mg/dL (ref 0.0–0.3)
Total Bilirubin: 0.6 mg/dL (ref 0.2–1.2)
Total Protein: 6.9 g/dL (ref 6.0–8.3)

## 2013-10-03 LAB — BASIC METABOLIC PANEL
BUN: 20 mg/dL (ref 6–23)
CO2: 25 mEq/L (ref 19–32)
CREATININE: 1.2 mg/dL (ref 0.4–1.5)
Calcium: 9.1 mg/dL (ref 8.4–10.5)
Chloride: 107 mEq/L (ref 96–112)
GFR: 62.12 mL/min (ref >60.00)
GLUCOSE: 111 mg/dL — AB (ref 70–99)
Potassium: 4.4 mEq/L (ref 3.5–5.1)
Sodium: 140 mEq/L (ref 135–145)

## 2013-10-03 NOTE — Telephone Encounter (Signed)
Pt came in the office today, pt still waiting on a call back from dr. Arnoldo Morale in regards to helping him decide on a new pcp.

## 2013-10-10 ENCOUNTER — Encounter: Payer: Self-pay | Admitting: Internal Medicine

## 2013-10-16 ENCOUNTER — Ambulatory Visit: Payer: Medicare Other | Admitting: Internal Medicine

## 2013-10-26 ENCOUNTER — Other Ambulatory Visit: Payer: Self-pay | Admitting: Internal Medicine

## 2013-10-29 ENCOUNTER — Other Ambulatory Visit: Payer: Self-pay | Admitting: Internal Medicine

## 2013-11-12 ENCOUNTER — Telehealth: Payer: Self-pay | Admitting: Internal Medicine

## 2013-11-12 NOTE — Telephone Encounter (Signed)
Ok with me 

## 2013-11-12 NOTE — Telephone Encounter (Signed)
Pt wants to know if he can switch from Dr. Arnoldo Morale to Dr. Ronnald Ramp.

## 2013-11-13 NOTE — Telephone Encounter (Signed)
Appt on July 30 to est with Dr. Ronnald Ramp.

## 2013-11-14 DIAGNOSIS — L570 Actinic keratosis: Secondary | ICD-10-CM | POA: Diagnosis not present

## 2013-11-14 DIAGNOSIS — D235 Other benign neoplasm of skin of trunk: Secondary | ICD-10-CM | POA: Diagnosis not present

## 2013-12-12 ENCOUNTER — Ambulatory Visit (INDEPENDENT_AMBULATORY_CARE_PROVIDER_SITE_OTHER): Payer: Medicare Other | Admitting: Internal Medicine

## 2013-12-12 ENCOUNTER — Encounter: Payer: Self-pay | Admitting: Internal Medicine

## 2013-12-12 ENCOUNTER — Other Ambulatory Visit (INDEPENDENT_AMBULATORY_CARE_PROVIDER_SITE_OTHER): Payer: Medicare Other

## 2013-12-12 VITALS — BP 140/80 | HR 78 | Temp 98.0°F | Resp 16 | Ht 71.0 in | Wt 232.1 lb

## 2013-12-12 DIAGNOSIS — I10 Essential (primary) hypertension: Secondary | ICD-10-CM | POA: Diagnosis not present

## 2013-12-12 DIAGNOSIS — R7309 Other abnormal glucose: Secondary | ICD-10-CM

## 2013-12-12 DIAGNOSIS — E538 Deficiency of other specified B group vitamins: Secondary | ICD-10-CM

## 2013-12-12 DIAGNOSIS — Z23 Encounter for immunization: Secondary | ICD-10-CM | POA: Diagnosis not present

## 2013-12-12 DIAGNOSIS — E781 Pure hyperglyceridemia: Secondary | ICD-10-CM

## 2013-12-12 DIAGNOSIS — M949 Disorder of cartilage, unspecified: Secondary | ICD-10-CM

## 2013-12-12 DIAGNOSIS — K8020 Calculus of gallbladder without cholecystitis without obstruction: Secondary | ICD-10-CM

## 2013-12-12 DIAGNOSIS — R7303 Prediabetes: Secondary | ICD-10-CM | POA: Insufficient documentation

## 2013-12-12 DIAGNOSIS — M899 Disorder of bone, unspecified: Secondary | ICD-10-CM

## 2013-12-12 LAB — BASIC METABOLIC PANEL
BUN: 27 mg/dL — AB (ref 6–23)
CALCIUM: 9.5 mg/dL (ref 8.4–10.5)
CO2: 25 meq/L (ref 19–32)
Chloride: 108 mEq/L (ref 96–112)
Creatinine, Ser: 1.2 mg/dL (ref 0.4–1.5)
GFR: 65.84 mL/min (ref >60.00)
Glucose, Bld: 98 mg/dL (ref 70–99)
Potassium: 4.7 mEq/L (ref 3.5–5.1)
Sodium: 141 mEq/L (ref 135–145)

## 2013-12-12 LAB — CBC WITH DIFFERENTIAL/PLATELET
BASOS PCT: 0.4 % (ref 0.0–3.0)
Basophils Absolute: 0 10*3/uL (ref 0.0–0.1)
EOS PCT: 2.7 % (ref 0.0–5.0)
Eosinophils Absolute: 0.2 10*3/uL (ref 0.0–0.7)
HEMATOCRIT: 39.9 % (ref 39.0–52.0)
HEMOGLOBIN: 13.2 g/dL (ref 13.0–17.0)
LYMPHS ABS: 1.4 10*3/uL (ref 0.7–4.0)
Lymphocytes Relative: 24.9 % (ref 12.0–46.0)
MCHC: 33.2 g/dL (ref 30.0–36.0)
MCV: 91.8 fl (ref 78.0–100.0)
MONO ABS: 0.6 10*3/uL (ref 0.1–1.0)
MONOS PCT: 10.9 % (ref 3.0–12.0)
NEUTROS ABS: 3.5 10*3/uL (ref 1.4–7.7)
Neutrophils Relative %: 61.1 % (ref 43.0–77.0)
Platelets: 222 10*3/uL (ref 150.0–400.0)
RBC: 4.34 Mil/uL (ref 4.22–5.81)
RDW: 14.1 % (ref 11.5–15.5)
WBC: 5.8 10*3/uL (ref 4.0–10.5)

## 2013-12-12 LAB — HEMOGLOBIN A1C: HEMOGLOBIN A1C: 6 % (ref 4.6–6.5)

## 2013-12-12 MED ORDER — CYANOCOBALAMIN 2000 MCG PO TABS: 2000.0000 ug | ORAL_TABLET | Freq: Every day | ORAL | Status: DC

## 2013-12-12 NOTE — Assessment & Plan Note (Signed)
His BP is well controlled 

## 2013-12-12 NOTE — Progress Notes (Signed)
Pre visit review using our clinic review tool, if applicable. No additional management support is needed unless otherwise documented below in the visit note. 

## 2013-12-12 NOTE — Progress Notes (Signed)
Subjective:    Patient ID: Louis Kent, male    DOB: [birth year], 73 y.o.   MRN: [id number]  Abdominal Pain This is a recurrent problem. The current episode started more than 1 year ago. The problem occurs intermittently. The problem has been unchanged. The pain is located in the epigastric region. The pain is at a severity of 1/10. The pain is mild. The quality of the pain is colicky. The abdominal pain does not radiate. Pertinent negatives include no anorexia, arthralgias, belching, constipation, diarrhea, dysuria, fever, flatus, frequency, headaches, hematochezia, hematuria, melena, myalgias, nausea, vomiting or weight loss. The pain is aggravated by eating. The pain is relieved by nothing. He has tried nothing for the symptoms. Prior diagnostic workup includes CT scan (+ gallstone on CT scan done 10 years ago). His past medical history is significant for gallstones.      Review of Systems  Constitutional: Negative.  Negative for fever, chills, weight loss, diaphoresis, activity change, appetite change, fatigue and unexpected weight change.  HENT: Negative.   Eyes: Negative.   Respiratory: Negative.  Negative for cough, choking, chest tightness, shortness of breath, wheezing and stridor.   Cardiovascular: Negative.  Negative for chest pain, palpitations and leg swelling.  Gastrointestinal: Positive for abdominal pain. Negative for nausea, vomiting, diarrhea, constipation, blood in stool, melena, hematochezia, anal bleeding, anorexia and flatus.  Endocrine: Negative.  Negative for polydipsia, polyphagia and polyuria.  Genitourinary: Negative.  Negative for dysuria, urgency, frequency and hematuria.  Musculoskeletal: Negative.  Negative for arthralgias and myalgias.  Skin: Negative.  Negative for rash.  Allergic/Immunologic: Negative.   Neurological: Negative.  Negative for headaches.  Hematological: Negative.  Negative for adenopathy. Does not bruise/bleed easily.    Psychiatric/Behavioral: Negative.        Objective:   Physical Exam  Vitals reviewed. Constitutional: He is oriented to person, place, and time. He appears well-developed and well-nourished. No distress.  HENT:  Head: Normocephalic and atraumatic.  Mouth/Throat: Oropharynx is clear and moist. No oropharyngeal exudate.  Eyes: Conjunctivae are normal. Right eye exhibits no discharge. Left eye exhibits no discharge. No scleral icterus.  Neck: Normal range of motion. Neck supple. No JVD present. No tracheal deviation present. No thyromegaly present.  Cardiovascular: Normal rate, regular rhythm, normal heart sounds and intact distal pulses.  Exam reveals no gallop and no friction rub.   No murmur heard. Pulmonary/Chest: Effort normal and breath sounds normal. No stridor. No respiratory distress. He has no wheezes. He has no rales. He exhibits no tenderness.  Abdominal: Soft. Normal appearance and bowel sounds are normal. He exhibits no shifting dullness, no distension, no pulsatile liver, no fluid wave, no abdominal bruit, no ascites, no pulsatile midline mass and no mass. There is no hepatosplenomegaly, splenomegaly or hepatomegaly. There is no tenderness. There is no rebound, no guarding and no CVA tenderness. A hernia is present. Hernia confirmed positive in the ventral area. Hernia confirmed negative in the right inguinal area and confirmed negative in the left inguinal area.    Musculoskeletal: Normal range of motion. He exhibits no edema and no tenderness.  Lymphadenopathy:    He has no cervical adenopathy.  Neurological: He is oriented to person, place, and time.  Skin: Skin is warm and dry. No rash noted. He is not diaphoretic. No erythema. No pallor.  Psychiatric: He has a normal mood and affect. His behavior is normal. Judgment and thought content normal.     Lab Results  Component Value Date   WBC 5.3  04/03/2013   HGB 13.5 04/03/2013   HCT 39.8 04/03/2013   PLT 217.0  04/03/2013   GLUCOSE 111* 10/03/2013   CHOL 187 10/03/2013   TRIG 106.0 10/03/2013   HDL 67.40 10/03/2013   LDLDIRECT 86.5 10/26/2006   LDLCALC 98 10/03/2013   ALT 31 10/03/2013   AST 35 10/03/2013   NA 140 10/03/2013   K 4.4 10/03/2013   CL 107 10/03/2013   CREATININE 1.2 10/03/2013   BUN 20 10/03/2013   CO2 25 10/03/2013   TSH 2.57 04/03/2013   PSA 0.01* 04/03/2013       Assessment & Plan:

## 2013-12-12 NOTE — Assessment & Plan Note (Signed)
He has pre-diabetes 

## 2013-12-12 NOTE — Patient Instructions (Signed)

## 2013-12-12 NOTE — Assessment & Plan Note (Signed)
The software blocked my order for B12 and folate Will check a methymalonic acid level as a surrogate and have asked him to start orla B12 supplement

## 2013-12-12 NOTE — Assessment & Plan Note (Signed)
I will recheck his U/S to see if there is more than one stone and have asked him to see general surgery to consider cholecystectomy

## 2013-12-14 LAB — METHYLMALONIC ACID, SERUM: Methylmalonic Acid, Quant: 173 nmol/L (ref 87–318)

## 2013-12-15 ENCOUNTER — Encounter: Payer: Self-pay | Admitting: Internal Medicine

## 2013-12-19 ENCOUNTER — Encounter: Payer: Self-pay | Admitting: Internal Medicine

## 2013-12-19 ENCOUNTER — Ambulatory Visit
Admission: RE | Admit: 2013-12-19 | Discharge: 2013-12-19 | Disposition: A | Discharge status: Home / Self Care | Payer: Medicare Other | Source: Ambulatory Visit | Attending: Internal Medicine | Admitting: Internal Medicine

## 2013-12-19 DIAGNOSIS — K8020 Calculus of gallbladder without cholecystitis without obstruction: Secondary | ICD-10-CM | POA: Diagnosis not present

## 2013-12-23 ENCOUNTER — Other Ambulatory Visit: Payer: Self-pay | Admitting: Internal Medicine

## 2013-12-30 ENCOUNTER — Encounter (INDEPENDENT_AMBULATORY_CARE_PROVIDER_SITE_OTHER): Payer: Self-pay | Admitting: Surgery

## 2013-12-30 ENCOUNTER — Ambulatory Visit (INDEPENDENT_AMBULATORY_CARE_PROVIDER_SITE_OTHER): Payer: Medicare Other | Admitting: Surgery

## 2013-12-30 VITALS — BP 140/86 | HR 67 | Temp 98.6°F | Ht 71.0 in | Wt 230.5 lb

## 2013-12-30 DIAGNOSIS — K8020 Calculus of gallbladder without cholecystitis without obstruction: Secondary | ICD-10-CM | POA: Insufficient documentation

## 2013-12-30 DIAGNOSIS — K439 Ventral hernia without obstruction or gangrene: Secondary | ICD-10-CM | POA: Diagnosis not present

## 2013-12-30 NOTE — Progress Notes (Signed)
Patient ID: Louis Kent, male   DOB: 1941-03-01, 73 y.o.   MRN: [id number]  Chief Complaint  Patient presents with  . Abdominal Pain    gallstones    HPI Louis Kent is a 73 y.o. male.   HPI This is a pleasant gentleman referred to me by Dr. Scarlette Calico for evaluation of such medical devices and a small ventral hernia. He has had the hernia for over a year. It causes no discomfort and always easily reduces. He has known gallstones. He only recently started having to have some minimal epigastric abdominal pain after fatty meals. He has no nausea or vomiting. The pain is sharp but only mild. He is otherwise without complaints. Past Medical History  Diagnosis Date  . Hyperlipidemia   . Hypertension   . S/P prostatectomy     Past Surgical History  Procedure Laterality Date  . Ankle arthroscopy w/ open repair    . Prostate surgery    . Tonsillectomy      Family History  Problem Relation Age of Onset  . Lung cancer    . Stroke Mother   . Diabetes Mother   . Cancer Father     lung cancer    Social History History  Substance Use Topics  . Smoking status: Never Smoker   . Smokeless tobacco: Not on file  . Alcohol Use: No    No Known Allergies  Current Outpatient Prescriptions  Medication Sig Dispense Refill  . aspirin 81 MG tablet Take 81 mg by mouth daily.        . calcium-vitamin D (OSCAL WITH D) 500-200 MG-UNIT per tablet Take 1 tablet by mouth daily.        . cyanocobalamin 2000 MCG tablet Take 1 tablet (2,000 mcg total) by mouth daily.  90 tablet  3  . fenofibrate (TRICOR) 145 MG tablet TAKE 1 TABLET BY MOUTH EVERY DAY  30 tablet  9  . irbesartan (AVAPRO) 300 MG tablet TAKE 1 TABLET (300 MG TOTAL) BY MOUTH DAILY.  30 tablet  6  . multivitamin (THERAGRAN) per tablet Take 1 tablet by mouth daily.        . Omega-3 Fatty Acids (FISH OIL) 1000 MG CAPS Take by mouth.        . Vitamin D, Ergocalciferol, (DRISDOL) 50000 UNITS CAPS capsule TAKE 1 CAPSULE BY MOUTH ONCE  A WEEK  4 capsule  5   No current facility-administered medications for this visit.    Review of Systems Review of Systems  Constitutional: Negative for fever, chills and unexpected weight change.  HENT: Negative for congestion, hearing loss, sore throat, trouble swallowing and voice change.   Eyes: Negative for visual disturbance.  Respiratory: Negative for cough and wheezing.   Cardiovascular: Negative for chest pain, palpitations and leg swelling.  Gastrointestinal: Positive for abdominal pain. Negative for nausea, vomiting, diarrhea, constipation, blood in stool, abdominal distention, anal bleeding and rectal pain.  Genitourinary: Negative for hematuria and difficulty urinating.  Musculoskeletal: Negative for arthralgias.  Skin: Negative for rash and wound.  Neurological: Negative for seizures, syncope, weakness and headaches.  Hematological: Negative for adenopathy. Does not bruise/bleed easily.  Psychiatric/Behavioral: Negative for confusion.    Blood pressure 140/86, pulse 67, temperature 98.6 F (37 C), temperature source Oral, height 5\' 11"  (1.803 m), weight 230 lb 8 oz (104.554 kg).  Physical Exam Physical Exam  Constitutional: He is oriented to person, place, and time. He appears well-developed and well-nourished. No distress.  HENT:  Head: Normocephalic and atraumatic.  Right Ear: External ear normal.  Left Ear: External ear normal.  Nose: Nose normal.  Mouth/Throat: Oropharynx is clear and moist.  Eyes: Conjunctivae are normal. Pupils are equal, round, and reactive to light. Right eye exhibits no discharge. Left eye exhibits no discharge. No scleral icterus.  Neck: Normal range of motion. Neck supple. No tracheal deviation present.  Cardiovascular: Normal rate, regular rhythm, normal heart sounds and intact distal pulses.   No murmur heard. Pulmonary/Chest: Effort normal and breath sounds normal. No respiratory distress. He has no wheezes. He has no rales.   Abdominal: Soft. Bowel sounds are normal. There is no tenderness. There is no guarding.  There is an easily reducible ventral hernia that is several inches above the umbilicus. There is no right upper quadrant tenderness  Musculoskeletal: Normal range of motion. He exhibits no edema and no tenderness.  Lymphadenopathy:    He has no cervical adenopathy.  Neurological: He is alert and oriented to person, place, and time.  Skin: Skin is warm and dry. No rash noted. He is not diaphoretic. No erythema.  Psychiatric: His behavior is normal. Judgment normal.    Data Reviewed Patient has an ultrasound demonstrating multiple gallstones with the largest being 2.6 cm. There is gallbladder sludge. Gallbladder wall is normal and the bile duct is normal  Assessment    #1 symptomatic cholelithiasis #2 ventral hernia     Plan     laparoscopic cholecystectomy as well as ventral hernia repair with mesh is recommended. I discussed the reasons for this with him.I discussed the procedure in detail.  The patient was given Neurosurgeon.  We discussed the risks and benefits of a laparoscopic cholecystectomy and possible cholangiogram including, but not limited to bleeding, infection, injury to surrounding structures such as the intestine or liver, bile leak, retained gallstones, need to convert to an open procedure, prolonged diarrhea, blood clots such as  DVT, common bile duct injury, anesthesia risks, and possible need for additional procedures.  The likelihood of improvement in symptoms and return to the patient's normal status is good. We discussed the typical post-operative recovery course.  Regarding the hernia, I discussed use of mesh. I also discussed postoperative recovery regarding hernia repair. He wished to proceed with surgery scheduling for the next several months        Louis Kent A 12/30/2013, 11:49 AM

## 2013-12-30 NOTE — Patient Instructions (Signed)
Our schedules will call you back to arrange surgery

## 2014-01-28 DIAGNOSIS — M1610 Unilateral primary osteoarthritis, unspecified hip: Secondary | ICD-10-CM | POA: Diagnosis not present

## 2014-01-28 DIAGNOSIS — E785 Hyperlipidemia, unspecified: Secondary | ICD-10-CM | POA: Diagnosis not present

## 2014-01-28 DIAGNOSIS — K469 Unspecified abdominal hernia without obstruction or gangrene: Secondary | ICD-10-CM | POA: Diagnosis not present

## 2014-01-28 DIAGNOSIS — I10 Essential (primary) hypertension: Secondary | ICD-10-CM | POA: Diagnosis not present

## 2014-01-28 DIAGNOSIS — Z96649 Presence of unspecified artificial hip joint: Secondary | ICD-10-CM | POA: Diagnosis not present

## 2014-01-28 DIAGNOSIS — M169 Osteoarthritis of hip, unspecified: Secondary | ICD-10-CM | POA: Diagnosis not present

## 2014-01-28 DIAGNOSIS — Z9889 Other specified postprocedural states: Secondary | ICD-10-CM | POA: Diagnosis not present

## 2014-02-03 ENCOUNTER — Telehealth: Payer: Self-pay | Admitting: Internal Medicine

## 2014-02-03 DIAGNOSIS — E538 Deficiency of other specified B group vitamins: Secondary | ICD-10-CM

## 2014-02-03 MED ORDER — FENOFIBRATE 145 MG PO TABS: ORAL_TABLET | ORAL | Status: DC

## 2014-02-03 MED ORDER — IRBESARTAN 300 MG PO TABS: ORAL_TABLET | ORAL | Status: DC

## 2014-02-03 MED ORDER — CYANOCOBALAMIN 2000 MCG PO TABS: 2000.0000 ug | ORAL_TABLET | Freq: Every day | ORAL | Status: DC

## 2014-02-03 MED ORDER — VITAMIN D (ERGOCALCIFEROL) 1.25 MG (50000 UNIT) PO CAPS: ORAL_CAPSULE | ORAL | Status: DC

## 2014-02-03 NOTE — Telephone Encounter (Signed)
Patient is leaving Wed the 23rd to go to [place].  He is requesting written scripts to take with him of his current meds.  He would like to pick up today or tomorrow.

## 2014-02-03 NOTE — Telephone Encounter (Signed)
All scripts printed and pt notified via mychart to pick up.

## 2014-03-12 DIAGNOSIS — C61 Malignant neoplasm of prostate: Secondary | ICD-10-CM | POA: Diagnosis not present

## 2014-03-12 DIAGNOSIS — Z0000 Encounter for general adult medical examination without abnormal findings: Secondary | ICD-10-CM | POA: Diagnosis not present

## 2014-03-13 ENCOUNTER — Ambulatory Visit (INDEPENDENT_AMBULATORY_CARE_PROVIDER_SITE_OTHER): Payer: Medicare Other | Admitting: Internal Medicine

## 2014-03-13 ENCOUNTER — Encounter: Payer: Self-pay | Admitting: Internal Medicine

## 2014-03-13 VITALS — BP 160/100 | HR 75 | Temp 97.8°F | Resp 16 | Ht 71.0 in | Wt 235.0 lb

## 2014-03-13 DIAGNOSIS — L609 Nail disorder, unspecified: Secondary | ICD-10-CM | POA: Diagnosis not present

## 2014-03-13 DIAGNOSIS — I10 Essential (primary) hypertension: Secondary | ICD-10-CM

## 2014-03-13 DIAGNOSIS — Z23 Encounter for immunization: Secondary | ICD-10-CM

## 2014-03-13 MED ORDER — HYDROCHLOROTHIAZIDE 12.5 MG PO CAPS: 12.5000 mg | ORAL_CAPSULE | Freq: Every day | ORAL | Status: DC

## 2014-03-13 NOTE — Progress Notes (Signed)
   Subjective:    Patient ID: Louis Kent, male    DOB: [birth year], 73 y.o.   MRN: [id number]  Hypertension This is a chronic problem. The problem has been gradually worsening since onset. The problem is uncontrolled. Associated symptoms include peripheral edema. Pertinent negatives include no anxiety, blurred vision, chest pain, headaches, malaise/fatigue, neck pain, orthopnea, palpitations, PND, shortness of breath or sweats. Agents associated with hypertension include NSAIDs. Past treatments include angiotensin blockers. The current treatment provides mild improvement. Compliance problems include diet and exercise.       Review of Systems  Constitutional: Negative.  Negative for fever, chills, malaise/fatigue, diaphoresis, appetite change and fatigue.  HENT: Negative.   Eyes: Negative.  Negative for blurred vision.  Respiratory: Negative.  Negative for cough, choking, chest tightness, shortness of breath and stridor.   Cardiovascular: Negative.  Negative for chest pain, palpitations, orthopnea, leg swelling and PND.  Gastrointestinal: Negative.  Negative for nausea, vomiting, abdominal pain, diarrhea, constipation and blood in stool.  Endocrine: Negative.   Genitourinary: Negative.   Musculoskeletal: Negative.  Negative for neck pain.  Skin: Negative.   Allergic/Immunologic: Negative.   Neurological: Negative.  Negative for dizziness, tremors, seizures, syncope, facial asymmetry, speech difficulty, weakness, light-headedness, numbness and headaches.  Hematological: Negative.  Negative for adenopathy. Does not bruise/bleed easily.  Psychiatric/Behavioral: Negative.        Objective:   Physical Exam  Constitutional: He is oriented to person, place, and time. He appears well-developed and well-nourished. No distress.  HENT:  Head: Normocephalic and atraumatic.  Mouth/Throat: Oropharynx is clear and moist. No oropharyngeal exudate.  Eyes: Conjunctivae are normal. Right eye  exhibits no discharge. Left eye exhibits no discharge. No scleral icterus.  Neck: Normal range of motion. Neck supple. No JVD present. No tracheal deviation present. No thyromegaly present.  Cardiovascular: Normal rate, regular rhythm, normal heart sounds and intact distal pulses.  Exam reveals no gallop and no friction rub.   No murmur heard. Pulmonary/Chest: Effort normal and breath sounds normal. No stridor. No respiratory distress. He has no wheezes. He has no rales. He exhibits no tenderness.  Abdominal: Soft. Bowel sounds are normal. He exhibits no distension and no mass. There is no tenderness. There is no rebound and no guarding.  Musculoskeletal: Normal range of motion. He exhibits edema (trace pitting edema in BLE). He exhibits no tenderness.  Lymphadenopathy:    He has no cervical adenopathy.  Neurological: He is oriented to person, place, and time.  Skin: Skin is warm and dry. No rash noted. He is not diaphoretic. No erythema. No pallor.  Left index finger shows a dark brown/black pigmented lesion with lysis that extends along the long axis of the fingernail  Vitals reviewed.    Lab Results  Component Value Date   WBC 5.8 12/12/2013   HGB 13.2 12/12/2013   HCT 39.9 12/12/2013   PLT 222.0 12/12/2013   GLUCOSE 98 12/12/2013   CHOL 187 10/03/2013   TRIG 106.0 10/03/2013   HDL 67.40 10/03/2013   LDLDIRECT 86.5 10/26/2006   LDLCALC 98 10/03/2013   ALT 31 10/03/2013   AST 35 10/03/2013   NA 141 12/12/2013   K 4.7 12/12/2013   CL 108 12/12/2013   CREATININE 1.2 12/12/2013   BUN 27* 12/12/2013   CO2 25 12/12/2013   TSH 2.57 04/03/2013   PSA 0.01* 04/03/2013   HGBA1C 6.0 12/12/2013       Assessment & Plan:

## 2014-03-13 NOTE — Patient Instructions (Signed)

## 2014-03-14 ENCOUNTER — Telehealth: Payer: Self-pay | Admitting: Internal Medicine

## 2014-03-14 DIAGNOSIS — Z23 Encounter for immunization: Secondary | ICD-10-CM | POA: Diagnosis not present

## 2014-03-14 NOTE — Telephone Encounter (Signed)
emmi emailed °

## 2014-03-16 ENCOUNTER — Encounter: Payer: Self-pay | Admitting: Internal Medicine

## 2014-03-16 NOTE — Assessment & Plan Note (Signed)
Will refer to hand surgery to consider having a biopsy done

## 2014-03-16 NOTE — Assessment & Plan Note (Signed)
His BP is not well controlled He agrees to work on his lifestyle modifications Will add HCTZ to the ARB for better BP control

## 2014-03-19 DIAGNOSIS — E559 Vitamin D deficiency, unspecified: Secondary | ICD-10-CM | POA: Diagnosis not present

## 2014-03-19 DIAGNOSIS — C61 Malignant neoplasm of prostate: Secondary | ICD-10-CM | POA: Diagnosis not present

## 2014-03-26 ENCOUNTER — Other Ambulatory Visit: Payer: Self-pay | Admitting: Orthopedic Surgery

## 2014-03-26 DIAGNOSIS — R229 Localized swelling, mass and lump, unspecified: Principal | ICD-10-CM

## 2014-03-26 DIAGNOSIS — D2111 Benign neoplasm of connective and other soft tissue of right upper limb, including shoulder: Secondary | ICD-10-CM | POA: Diagnosis not present

## 2014-03-26 DIAGNOSIS — IMO0002 Reserved for concepts with insufficient information to code with codable children: Secondary | ICD-10-CM

## 2014-03-27 DIAGNOSIS — H2513 Age-related nuclear cataract, bilateral: Secondary | ICD-10-CM | POA: Diagnosis not present

## 2014-03-27 DIAGNOSIS — H25013 Cortical age-related cataract, bilateral: Secondary | ICD-10-CM | POA: Diagnosis not present

## 2014-04-07 ENCOUNTER — Ambulatory Visit
Admission: RE | Admit: 2014-04-07 | Discharge: 2014-04-07 | Disposition: A | Discharge status: Home / Self Care | Payer: Medicare Other | Source: Ambulatory Visit | Attending: Orthopedic Surgery | Admitting: Orthopedic Surgery

## 2014-04-07 DIAGNOSIS — IMO0002 Reserved for concepts with insufficient information to code with codable children: Secondary | ICD-10-CM

## 2014-04-07 DIAGNOSIS — R229 Localized swelling, mass and lump, unspecified: Principal | ICD-10-CM

## 2014-04-22 ENCOUNTER — Ambulatory Visit
Admission: RE | Admit: 2014-04-22 | Discharge: 2014-04-22 | Disposition: A | Discharge status: Home / Self Care | Payer: Medicare Other | Source: Ambulatory Visit | Attending: Orthopedic Surgery | Admitting: Orthopedic Surgery

## 2014-04-22 DIAGNOSIS — M19041 Primary osteoarthritis, right hand: Secondary | ICD-10-CM | POA: Diagnosis not present

## 2014-04-22 DIAGNOSIS — L988 Other specified disorders of the skin and subcutaneous tissue: Secondary | ICD-10-CM | POA: Diagnosis not present

## 2014-04-24 ENCOUNTER — Ambulatory Visit: Payer: Medicare Other | Admitting: Internal Medicine

## 2014-04-28 ENCOUNTER — Ambulatory Visit (INDEPENDENT_AMBULATORY_CARE_PROVIDER_SITE_OTHER): Payer: Medicare Other | Admitting: Internal Medicine

## 2014-04-28 VITALS — BP 146/82 | HR 67 | Temp 97.8°F | Resp 16 | Ht 71.0 in | Wt 234.0 lb

## 2014-04-28 DIAGNOSIS — H6123 Impacted cerumen, bilateral: Secondary | ICD-10-CM | POA: Diagnosis not present

## 2014-04-28 DIAGNOSIS — I10 Essential (primary) hypertension: Secondary | ICD-10-CM

## 2014-04-28 DIAGNOSIS — D2111 Benign neoplasm of connective and other soft tissue of right upper limb, including shoulder: Secondary | ICD-10-CM | POA: Diagnosis not present

## 2014-04-28 DIAGNOSIS — H6120 Impacted cerumen, unspecified ear: Secondary | ICD-10-CM | POA: Insufficient documentation

## 2014-04-28 NOTE — Progress Notes (Signed)
Subjective:    Patient ID: Louis Kent, male    DOB: [birth year], 73 y.o.   MRN: [id number]  Hypertension This is a chronic problem. The current episode started more than 1 year ago. The problem has been gradually improving since onset. The problem is controlled. Associated symptoms include peripheral edema. Pertinent negatives include no anxiety, blurred vision, chest pain, headaches, malaise/fatigue, neck pain, orthopnea, palpitations, PND, shortness of breath or sweats. Past treatments include angiotensin blockers and diuretics. The current treatment provides moderate improvement. Compliance problems include diet and exercise.       Review of Systems  Constitutional: Negative.  Negative for fever, chills, malaise/fatigue, diaphoresis, appetite change and fatigue.  HENT: Positive for hearing loss. Negative for ear pain, facial swelling, nosebleeds, postnasal drip, rhinorrhea, sinus pressure and trouble swallowing.   Eyes: Negative.  Negative for blurred vision.  Respiratory: Negative.  Negative for shortness of breath.   Cardiovascular: Negative.  Negative for chest pain, palpitations, orthopnea, leg swelling and PND.  Gastrointestinal: Negative.  Negative for abdominal pain.  Endocrine: Negative.   Genitourinary: Negative.   Musculoskeletal: Negative.  Negative for myalgias, back pain, arthralgias and neck pain.  Skin: Negative.  Negative for rash.  Allergic/Immunologic: Negative.   Neurological: Negative.  Negative for headaches.  Hematological: Negative.  Negative for adenopathy. Does not bruise/bleed easily.  Psychiatric/Behavioral: Negative.        Objective:   Physical Exam  Constitutional: He is oriented to person, place, and time. He appears well-developed and well-nourished. No distress.  HENT:  Head: Normocephalic and atraumatic.  Right Ear: Hearing, tympanic membrane, external ear and ear canal normal.  Left Ear: Hearing, tympanic membrane, external ear and ear  canal normal.  Mouth/Throat: Oropharynx is clear and moist. No oropharyngeal exudate.  There is impacted cerumen in both EAC's, I put colace in both sides and irrigated it with water and I used an ear pick to remove the was. He tolerated this well. The post exam shows normal EAC's and TM's.  Eyes: Conjunctivae are normal. Right eye exhibits no discharge. Left eye exhibits no discharge. No scleral icterus.  Neck: Normal range of motion. Neck supple. No JVD present. No tracheal deviation present. No thyromegaly present.  Cardiovascular: Normal rate, regular rhythm, normal heart sounds and intact distal pulses.  Exam reveals no gallop and no friction rub.   No murmur heard. Pulmonary/Chest: Effort normal and breath sounds normal. No stridor. No respiratory distress. He has no wheezes. He has no rales. He exhibits no tenderness.  Abdominal: Soft. Bowel sounds are normal. He exhibits no distension and no mass. There is no tenderness. There is no rebound and no guarding.  Musculoskeletal: Normal range of motion. He exhibits edema (1+ pitting edema in BLE). He exhibits no tenderness.  Lymphadenopathy:    He has no cervical adenopathy.  Neurological: He is oriented to person, place, and time.  Skin: Skin is warm and dry. No rash noted. He is not diaphoretic. No erythema. No pallor.  Vitals reviewed.     Lab Results  Component Value Date   WBC 5.8 12/12/2013   HGB 13.2 12/12/2013   HCT 39.9 12/12/2013   PLT 222.0 12/12/2013   GLUCOSE 98 12/12/2013   CHOL 187 10/03/2013   TRIG 106.0 10/03/2013   HDL 67.40 10/03/2013   LDLDIRECT 86.5 10/26/2006   LDLCALC 98 10/03/2013   ALT 31 10/03/2013   AST 35 10/03/2013   NA 141 12/12/2013   K 4.7 12/12/2013   CL 108  12/12/2013   CREATININE 1.2 12/12/2013   BUN 27* 12/12/2013   CO2 25 12/12/2013   TSH 2.57 04/03/2013   PSA 0.01* 04/03/2013   HGBA1C 6.0 12/12/2013      Assessment & Plan:

## 2014-04-28 NOTE — Patient Instructions (Signed)

## 2014-04-28 NOTE — Progress Notes (Signed)
Pre visit review using our clinic review tool, if applicable. No additional management support is needed unless otherwise documented below in the visit note. 

## 2014-04-29 ENCOUNTER — Encounter: Payer: Self-pay | Admitting: Internal Medicine

## 2014-04-29 NOTE — Assessment & Plan Note (Signed)
Cerumen was successfully removed

## 2014-04-29 NOTE — Assessment & Plan Note (Signed)
His BP is well controlled 

## 2014-05-20 DIAGNOSIS — M1611 Unilateral primary osteoarthritis, right hip: Secondary | ICD-10-CM | POA: Diagnosis not present

## 2014-05-20 DIAGNOSIS — Z79899 Other long term (current) drug therapy: Secondary | ICD-10-CM | POA: Diagnosis not present

## 2014-05-20 DIAGNOSIS — I10 Essential (primary) hypertension: Secondary | ICD-10-CM | POA: Diagnosis not present

## 2014-05-20 DIAGNOSIS — E785 Hyperlipidemia, unspecified: Secondary | ICD-10-CM | POA: Diagnosis not present

## 2014-05-23 DIAGNOSIS — Z8679 Personal history of other diseases of the circulatory system: Secondary | ICD-10-CM | POA: Diagnosis not present

## 2014-05-23 DIAGNOSIS — Z01810 Encounter for preprocedural cardiovascular examination: Secondary | ICD-10-CM | POA: Diagnosis not present

## 2014-05-23 DIAGNOSIS — Z01812 Encounter for preprocedural laboratory examination: Secondary | ICD-10-CM | POA: Diagnosis not present

## 2014-05-23 DIAGNOSIS — M169 Osteoarthritis of hip, unspecified: Secondary | ICD-10-CM | POA: Diagnosis not present

## 2014-05-29 DIAGNOSIS — M25551 Pain in right hip: Secondary | ICD-10-CM | POA: Diagnosis not present

## 2014-05-29 DIAGNOSIS — Z5189 Encounter for other specified aftercare: Secondary | ICD-10-CM | POA: Diagnosis not present

## 2014-06-12 DIAGNOSIS — M25551 Pain in right hip: Secondary | ICD-10-CM | POA: Diagnosis not present

## 2014-06-12 DIAGNOSIS — K439 Ventral hernia without obstruction or gangrene: Secondary | ICD-10-CM | POA: Diagnosis present

## 2014-06-12 DIAGNOSIS — Z7982 Long term (current) use of aspirin: Secondary | ICD-10-CM | POA: Diagnosis not present

## 2014-06-12 DIAGNOSIS — E785 Hyperlipidemia, unspecified: Secondary | ICD-10-CM | POA: Diagnosis present

## 2014-06-12 DIAGNOSIS — Z96641 Presence of right artificial hip joint: Secondary | ICD-10-CM | POA: Diagnosis not present

## 2014-06-12 DIAGNOSIS — Z471 Aftercare following joint replacement surgery: Secondary | ICD-10-CM | POA: Diagnosis not present

## 2014-06-12 DIAGNOSIS — I10 Essential (primary) hypertension: Secondary | ICD-10-CM | POA: Diagnosis present

## 2014-06-12 DIAGNOSIS — D62 Acute posthemorrhagic anemia: Secondary | ICD-10-CM | POA: Diagnosis not present

## 2014-06-12 DIAGNOSIS — M1611 Unilateral primary osteoarthritis, right hip: Secondary | ICD-10-CM | POA: Diagnosis not present

## 2014-06-12 DIAGNOSIS — G8918 Other acute postprocedural pain: Secondary | ICD-10-CM | POA: Diagnosis not present

## 2014-06-12 DIAGNOSIS — Z8546 Personal history of malignant neoplasm of prostate: Secondary | ICD-10-CM | POA: Diagnosis not present

## 2014-06-12 DIAGNOSIS — M069 Rheumatoid arthritis, unspecified: Secondary | ICD-10-CM | POA: Diagnosis present

## 2014-06-24 DIAGNOSIS — Z96641 Presence of right artificial hip joint: Secondary | ICD-10-CM | POA: Diagnosis not present

## 2014-06-24 DIAGNOSIS — Z471 Aftercare following joint replacement surgery: Secondary | ICD-10-CM | POA: Diagnosis not present

## 2014-06-25 DIAGNOSIS — M1611 Unilateral primary osteoarthritis, right hip: Secondary | ICD-10-CM | POA: Diagnosis not present

## 2014-07-02 DIAGNOSIS — M1611 Unilateral primary osteoarthritis, right hip: Secondary | ICD-10-CM | POA: Diagnosis not present

## 2014-07-23 DIAGNOSIS — Z471 Aftercare following joint replacement surgery: Secondary | ICD-10-CM | POA: Diagnosis not present

## 2014-07-23 DIAGNOSIS — Z96641 Presence of right artificial hip joint: Secondary | ICD-10-CM | POA: Diagnosis not present

## 2014-09-02 ENCOUNTER — Ambulatory Visit (INDEPENDENT_AMBULATORY_CARE_PROVIDER_SITE_OTHER): Payer: Medicare Other | Admitting: Internal Medicine

## 2014-09-02 ENCOUNTER — Encounter: Payer: Self-pay | Admitting: Internal Medicine

## 2014-09-02 VITALS — BP 150/78 | HR 77 | Temp 98.4°F | Resp 16 | Wt 235.0 lb

## 2014-09-02 DIAGNOSIS — Z23 Encounter for immunization: Secondary | ICD-10-CM | POA: Diagnosis not present

## 2014-09-02 DIAGNOSIS — E785 Hyperlipidemia, unspecified: Secondary | ICD-10-CM | POA: Diagnosis not present

## 2014-09-02 DIAGNOSIS — I10 Essential (primary) hypertension: Secondary | ICD-10-CM | POA: Diagnosis not present

## 2014-09-02 DIAGNOSIS — E781 Pure hyperglyceridemia: Secondary | ICD-10-CM | POA: Diagnosis not present

## 2014-09-02 NOTE — Patient Instructions (Signed)

## 2014-09-02 NOTE — Progress Notes (Signed)
Pre visit review using our clinic review tool, if applicable. No additional management support is needed unless otherwise documented below in the visit note. 

## 2014-09-03 DIAGNOSIS — E785 Hyperlipidemia, unspecified: Secondary | ICD-10-CM | POA: Insufficient documentation

## 2014-09-03 NOTE — Assessment & Plan Note (Signed)
He is doing well on omega-3-fish oils Will recheck his FLP He agrees to improve on his lifestyle modifications

## 2014-09-03 NOTE — Assessment & Plan Note (Signed)
Will recheck his FLP At this time he is not willing to take a statin

## 2014-09-03 NOTE — Assessment & Plan Note (Signed)
His BP is adequately well controlled Will monitor his lytes and renal function

## 2014-09-03 NOTE — Progress Notes (Signed)
   Subjective:    Patient ID: Louis Kent, male    DOB: 08-25-1940, 74 y.o.   MRN: [id number]  Hypertension This is a chronic problem. The current episode started more than 1 year ago. The problem is unchanged. The problem is controlled. Pertinent negatives include no anxiety, blurred vision, chest pain, headaches, malaise/fatigue, neck pain, orthopnea, palpitations, peripheral edema, PND, shortness of breath or sweats. Past treatments include diuretics and angiotensin blockers. The current treatment provides moderate improvement. Compliance problems include diet and exercise.   Hyperlipidemia Pertinent negatives include no chest pain, myalgias or shortness of breath.      Review of Systems  Constitutional: Negative.  Negative for fever, chills, malaise/fatigue, diaphoresis, appetite change and fatigue.  HENT: Negative.  Negative for trouble swallowing and voice change.   Eyes: Negative.  Negative for blurred vision.  Respiratory: Negative.  Negative for cough, choking, chest tightness, shortness of breath and stridor.   Cardiovascular: Negative.  Negative for chest pain, palpitations, orthopnea, leg swelling and PND.  Gastrointestinal: Negative.  Negative for nausea, vomiting, abdominal pain, diarrhea, constipation and blood in stool.  Endocrine: Negative.   Genitourinary: Negative.   Musculoskeletal: Negative.  Negative for myalgias, back pain, arthralgias and neck pain.  Skin: Negative.  Negative for rash.  Allergic/Immunologic: Negative.   Neurological: Negative.  Negative for dizziness and headaches.  Hematological: Negative.  Negative for adenopathy. Does not bruise/bleed easily.  Psychiatric/Behavioral: Negative.        Objective:   Physical Exam  Constitutional: He is oriented to person, place, and time. He appears well-developed and well-nourished. No distress.  HENT:  Head: Normocephalic and atraumatic.  Mouth/Throat: Oropharynx is clear and moist. No oropharyngeal  exudate.  Eyes: Conjunctivae are normal. Right eye exhibits no discharge. Left eye exhibits no discharge. No scleral icterus.  Neck: Normal range of motion. Neck supple. No JVD present. No tracheal deviation present. No thyromegaly present.  Cardiovascular: Normal rate, regular rhythm, normal heart sounds and intact distal pulses.  Exam reveals no gallop and no friction rub.   No murmur heard. Pulmonary/Chest: Effort normal and breath sounds normal. No stridor. No respiratory distress. He has no wheezes. He has no rales. He exhibits no tenderness.  Abdominal: Soft. Bowel sounds are normal. He exhibits no distension and no mass. There is no tenderness. There is no rebound and no guarding.  Musculoskeletal: Normal range of motion. He exhibits no edema or tenderness.  Lymphadenopathy:    He has no cervical adenopathy.  Neurological: He is oriented to person, place, and time.  Skin: Skin is warm and dry. No rash noted. He is not diaphoretic. No erythema. No pallor.  Vitals reviewed.   Lab Results  Component Value Date   WBC 5.8 12/12/2013   HGB 13.2 12/12/2013   HCT 39.9 12/12/2013   PLT 222.0 12/12/2013   GLUCOSE 98 12/12/2013   CHOL 187 10/03/2013   TRIG 106.0 10/03/2013   HDL 67.40 10/03/2013   LDLDIRECT 86.5 10/26/2006   LDLCALC 98 10/03/2013   ALT 31 10/03/2013   AST 35 10/03/2013   NA 141 12/12/2013   K 4.7 12/12/2013   CL 108 12/12/2013   CREATININE 1.2 12/12/2013   BUN 27* 12/12/2013   CO2 25 12/12/2013   TSH 2.57 04/03/2013   PSA 0.01* 04/03/2013   HGBA1C 6.0 12/12/2013        Assessment & Plan:

## 2014-09-10 ENCOUNTER — Other Ambulatory Visit: Payer: Self-pay | Admitting: Internal Medicine

## 2014-09-11 ENCOUNTER — Other Ambulatory Visit: Payer: Self-pay | Admitting: Internal Medicine

## 2014-09-24 DIAGNOSIS — Z471 Aftercare following joint replacement surgery: Secondary | ICD-10-CM | POA: Diagnosis not present

## 2014-09-24 DIAGNOSIS — Z96642 Presence of left artificial hip joint: Secondary | ICD-10-CM | POA: Diagnosis not present

## 2014-09-24 DIAGNOSIS — M25552 Pain in left hip: Secondary | ICD-10-CM | POA: Diagnosis not present

## 2014-09-24 DIAGNOSIS — M1610 Unilateral primary osteoarthritis, unspecified hip: Secondary | ICD-10-CM | POA: Diagnosis not present

## 2014-09-24 DIAGNOSIS — E785 Hyperlipidemia, unspecified: Secondary | ICD-10-CM | POA: Diagnosis not present

## 2014-09-24 DIAGNOSIS — Z96641 Presence of right artificial hip joint: Secondary | ICD-10-CM | POA: Diagnosis not present

## 2014-09-24 DIAGNOSIS — I10 Essential (primary) hypertension: Secondary | ICD-10-CM | POA: Diagnosis not present

## 2014-09-24 DIAGNOSIS — M25551 Pain in right hip: Secondary | ICD-10-CM | POA: Diagnosis not present

## 2015-01-05 ENCOUNTER — Encounter: Payer: Self-pay | Admitting: Internal Medicine

## 2015-01-05 ENCOUNTER — Other Ambulatory Visit (INDEPENDENT_AMBULATORY_CARE_PROVIDER_SITE_OTHER): Payer: Medicare Other

## 2015-01-05 ENCOUNTER — Ambulatory Visit (INDEPENDENT_AMBULATORY_CARE_PROVIDER_SITE_OTHER): Payer: Medicare Other | Admitting: Internal Medicine

## 2015-01-05 VITALS — BP 132/82 | HR 77 | Temp 98.2°F | Resp 16 | Ht 71.0 in | Wt 240.0 lb

## 2015-01-05 DIAGNOSIS — I10 Essential (primary) hypertension: Secondary | ICD-10-CM

## 2015-01-05 DIAGNOSIS — R739 Hyperglycemia, unspecified: Secondary | ICD-10-CM

## 2015-01-05 DIAGNOSIS — E538 Deficiency of other specified B group vitamins: Secondary | ICD-10-CM

## 2015-01-05 DIAGNOSIS — I6529 Occlusion and stenosis of unspecified carotid artery: Secondary | ICD-10-CM

## 2015-01-05 DIAGNOSIS — E785 Hyperlipidemia, unspecified: Secondary | ICD-10-CM

## 2015-01-05 DIAGNOSIS — E781 Pure hyperglyceridemia: Secondary | ICD-10-CM

## 2015-01-05 LAB — CBC WITH DIFFERENTIAL/PLATELET
Basophils Absolute: 0 10*3/uL (ref 0.0–0.1)
Basophils Relative: 0.6 % (ref 0.0–3.0)
EOS PCT: 3.9 % (ref 0.0–5.0)
Eosinophils Absolute: 0.2 10*3/uL (ref 0.0–0.7)
HCT: 39.5 % (ref 39.0–52.0)
Hemoglobin: 13.3 g/dL (ref 13.0–17.0)
LYMPHS ABS: 1.9 10*3/uL (ref 0.7–4.0)
Lymphocytes Relative: 31.9 % (ref 12.0–46.0)
MCHC: 33.6 g/dL (ref 30.0–36.0)
MCV: 90.9 fl (ref 78.0–100.0)
MONO ABS: 0.7 10*3/uL (ref 0.1–1.0)
Monocytes Relative: 10.8 % (ref 3.0–12.0)
NEUTROS PCT: 52.8 % (ref 43.0–77.0)
Neutro Abs: 3.2 10*3/uL (ref 1.4–7.7)
Platelets: 224 10*3/uL (ref 150.0–400.0)
RBC: 4.34 Mil/uL (ref 4.22–5.81)
RDW: 14.1 % (ref 11.5–15.5)
WBC: 6.1 10*3/uL (ref 4.0–10.5)

## 2015-01-05 LAB — HEMOGLOBIN A1C: HEMOGLOBIN A1C: 5.9 % (ref 4.6–6.5)

## 2015-01-05 LAB — BASIC METABOLIC PANEL
BUN: 32 mg/dL — AB (ref 6–23)
CO2: 26 mEq/L (ref 19–32)
CREATININE: 1.2 mg/dL (ref 0.40–1.50)
Calcium: 9.5 mg/dL (ref 8.4–10.5)
Chloride: 105 mEq/L (ref 96–112)
GFR: 62.51 mL/min (ref >60.00)
GLUCOSE: 77 mg/dL (ref 70–99)
POTASSIUM: 4.5 meq/L (ref 3.5–5.1)
Sodium: 140 mEq/L (ref 135–145)

## 2015-01-05 LAB — LIPID PANEL
CHOLESTEROL: 169 mg/dL (ref 0–200)
HDL: 68.2 mg/dL (ref >39.00)
LDL CALC: 77 mg/dL (ref 0–99)
NonHDL: 100.39
TRIGLYCERIDES: 119 mg/dL (ref 0.0–149.0)
Total CHOL/HDL Ratio: 2
VLDL: 23.8 mg/dL (ref 0.0–40.0)

## 2015-01-05 LAB — TSH: TSH: 1.27 u[IU]/mL (ref 0.35–4.50)

## 2015-01-05 LAB — FOLATE: Folate: >24.8 ng/mL (ref >5.9)

## 2015-01-05 LAB — VITAMIN B12: Vitamin B-12: 854 pg/mL (ref 211–911)

## 2015-01-05 NOTE — Progress Notes (Signed)
Pre visit review using our clinic review tool, if applicable. No additional management support is needed unless otherwise documented below in the visit note. 

## 2015-01-05 NOTE — Patient Instructions (Signed)

## 2015-01-05 NOTE — Progress Notes (Signed)
Subjective:  Patient ID: Louis Kent, male    DOB: [birth year]  Age: 74 y.o. MRN: [id number]  CC: Hypertension and Hyperlipidemia   HPI ANDRICK RUST presents for follow-up, he recently saw his dentist and had some plain x-rays done. They have sent me a note that raises concern that there are possible calcifications in his carotid arteries. He otherwise feels well and offers no complaints. He has decided not to have surgery for his gallbladder and ventral abdominal hernia.  Outpatient Prescriptions Prior to Visit  Medication Sig Dispense Refill  . aspirin 81 MG tablet Take 81 mg by mouth daily.      . calcium-vitamin D (OSCAL WITH D) 500-200 MG-UNIT per tablet Take 1 tablet by mouth daily.      . cyanocobalamin 2000 MCG tablet TAKE 1 TABLET (2,000 MCG TOTAL) BY MOUTH DAILY. 90 tablet 3  . fenofibrate (TRICOR) 145 MG tablet TAKE 1 TABLET BY MOUTH EVERY DAY 90 tablet 3  . hydrochlorothiazide (MICROZIDE) 12.5 MG capsule TAKE 1 CAPSULE (12.5 MG TOTAL) BY MOUTH DAILY. 90 capsule 1  . irbesartan (AVAPRO) 300 MG tablet TAKE 1 TABLET (300 MG TOTAL) BY MOUTH DAILY. 90 tablet 3  . multivitamin (THERAGRAN) per tablet Take 1 tablet by mouth daily.      . Omega-3 Fatty Acids (FISH OIL) 1000 MG CAPS Take by mouth.      . Vitamin D, Ergocalciferol, (DRISDOL) 50000 UNITS CAPS capsule TAKE 1 CAPSULE BY MOUTH ONCE A WEEK 12 capsule 3  . cyanocobalamin 2000 MCG tablet TAKE 1 TABLET (2,000 MCG TOTAL) BY MOUTH DAILY. 90 tablet 3   No facility-administered medications prior to visit.    ROS Review of Systems  Constitutional: Negative.  Negative for fever, chills, diaphoresis, appetite change and fatigue.  HENT: Negative.   Eyes: Negative.   Respiratory: Negative.  Negative for cough, choking, chest tightness, shortness of breath, wheezing and stridor.   Cardiovascular: Negative.  Negative for chest pain, palpitations and leg swelling.  Gastrointestinal: Negative.  Negative for nausea, vomiting,  abdominal pain, diarrhea, constipation and blood in stool.  Endocrine: Negative.   Genitourinary: Negative.   Musculoskeletal: Negative.   Skin: Negative.   Allergic/Immunologic: Negative.   Neurological: Negative.  Negative for dizziness, tremors, weakness, light-headedness, numbness and headaches.  Hematological: Negative.  Negative for adenopathy. Does not bruise/bleed easily.  Psychiatric/Behavioral: Negative.     Objective:  BP 132/82 mmHg  Pulse 77  Temp(Src) 98.2 F (36.8 C) (Oral)  Resp 16  Ht 5\' 11"  (1.803 m)  Wt 240 lb (108.863 kg)  BMI 33.49 kg/m2  SpO2 96%  BP Readings from Last 3 Encounters:  01/05/15 132/82  09/02/14 150/78  04/28/14 146/82    Wt Readings from Last 3 Encounters:  01/05/15 240 lb (108.863 kg)  09/02/14 235 lb (106.595 kg)  04/28/14 234 lb (106.142 kg)    Physical Exam  Constitutional: He is oriented to person, place, and time. No distress.  HENT:  Mouth/Throat: Oropharynx is clear and moist. No oropharyngeal exudate.  Eyes: Conjunctivae are normal. Right eye exhibits no discharge. Left eye exhibits no discharge. No scleral icterus.  Neck: Normal range of motion. Neck supple. No JVD present. Carotid bruit is not present. No tracheal deviation present. No thyroid mass and no thyromegaly present.  Cardiovascular: Normal rate, regular rhythm, normal heart sounds and intact distal pulses.  Exam reveals no gallop and no friction rub.   No murmur heard. Pulses:      Carotid pulses  are 2+ on the right side, and 2+ on the left side.      Radial pulses are 2+ on the right side, and 2+ on the left side.       Femoral pulses are 2+ on the right side, and 2+ on the left side.      Popliteal pulses are 2+ on the right side, and 2+ on the left side.       Dorsalis pedis pulses are 2+ on the right side, and 2+ on the left side.       Posterior tibial pulses are 2+ on the right side, and 2+ on the left side.  Pulmonary/Chest: Effort normal and breath  sounds normal. No stridor. No respiratory distress. He has no wheezes. He has no rales. He exhibits no tenderness.  Abdominal: Soft. Bowel sounds are normal. He exhibits no distension and no mass. There is no tenderness. There is no rebound and no guarding.  Musculoskeletal: Normal range of motion. He exhibits edema (trace edema in bilateral lower extremities). He exhibits no tenderness.  Lymphadenopathy:    He has no cervical adenopathy.  Neurological: He is oriented to person, place, and time.  Skin: Skin is warm and dry. No rash noted. He is not diaphoretic. No erythema. No pallor.  Vitals reviewed.   Lab Results  Component Value Date   WBC 6.1 01/05/2015   HGB 13.3 01/05/2015   HCT 39.5 01/05/2015   PLT 224.0 01/05/2015   GLUCOSE 77 01/05/2015   CHOL 169 01/05/2015   TRIG 119.0 01/05/2015   HDL 68.20 01/05/2015   LDLDIRECT 86.5 10/26/2006   LDLCALC 77 01/05/2015   ALT 31 10/03/2013   AST 35 10/03/2013   NA 140 01/05/2015   K 4.5 01/05/2015   CL 105 01/05/2015   CREATININE 1.20 01/05/2015   BUN 32* 01/05/2015   CO2 26 01/05/2015   TSH 1.27 01/05/2015   PSA 0.01* 04/03/2013   HGBA1C 5.9 01/05/2015    Mr Fingers Right W/o Cm  04/22/2014   CLINICAL DATA:  74 year old with discolored right index finger nailbed for 3 months. No known injury. Initial encounter.  EXAM: MRI OF THE RIGHT FINGERS WITHOUT CONTRAST  TECHNIQUE: Multiplanar, multisequence MR imaging was performed. No intravenous contrast was administered.  COMPARISON:  None.  FINDINGS: Imaging is targeted to the index finger, but includes portions of the long and ring fingers. No contrast was given in. There is a tubular focus of T2 hyperintensity along the radial aspect of the index finger nail bed. This measures approximately 6 x 3 x 2 mm. This finding is not well seen on the T1 weighted images. There is no osseous erosion or adjacent bone marrow edema.  No other masses, fluid collections or inflammatory changes are  identified. There are mild interphalangeal degenerative changes within the index and long fingers. No erosive changes are seen. The finger tendons appear unremarkable.  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 No osseous erosion or marrow edema identified.   Electronically Signed   By: Camie Patience M.D.   On: 04/22/2014 17:25    Assessment & Plan:   Jantzen was seen today for hypertension and hyperlipidemia.  Diagnoses and all orders for this visit:  Carotid atherosclerosis, unspecified laterality- I have ordered a carotid ultrasound to see if there is significant atherosclerosis -     Carotid; Future -     Lipid panel; Future  Essential hypertension- his blood pressure  is well-controlled, his electrolytes and renal function are stable. -     Basic metabolic panel; Future  Hyperlipidemia with target LDL less than 130- he has achieved his LDL goal and desires not to be on statin -     TSH; Future  Hyperglycemia- he has prediabetes and will continue to work on his lifestyle modifications -     Basic metabolic panel; Future -     Hemoglobin A1c; Future  Pure hyperglyceridemia- improvement noted  B12 nutritional deficiency -     Vitamin B12; Future -     Folate; Future -     CBC with Differential/Platelet; Future   I am having Mr. Tobey maintain his aspirin, multivitamin, Fish Oil, calcium-vitamin D, fenofibrate, irbesartan, Vitamin D (Ergocalciferol), hydrochlorothiazide, and cyanocobalamin.  No orders of the defined types were placed in this encounter.     Follow-up: Return in about 4 months (around 05/07/2015).  Scarlette Calico, MD

## 2015-01-29 ENCOUNTER — Encounter: Payer: Self-pay | Admitting: Internal Medicine

## 2015-02-09 ENCOUNTER — Ambulatory Visit (HOSPITAL_COMMUNITY)
Admission: RE | Admit: 2015-02-09 | Discharge: 2015-02-09 | Disposition: A | Discharge status: Home / Self Care | Payer: Medicare Other | Source: Ambulatory Visit | Attending: Internal Medicine | Admitting: Internal Medicine

## 2015-02-09 DIAGNOSIS — I6523 Occlusion and stenosis of bilateral carotid arteries: Secondary | ICD-10-CM | POA: Diagnosis not present

## 2015-02-09 DIAGNOSIS — I10 Essential (primary) hypertension: Secondary | ICD-10-CM | POA: Diagnosis not present

## 2015-02-09 DIAGNOSIS — E785 Hyperlipidemia, unspecified: Secondary | ICD-10-CM | POA: Insufficient documentation

## 2015-02-09 DIAGNOSIS — I6529 Occlusion and stenosis of unspecified carotid artery: Secondary | ICD-10-CM

## 2015-02-11 ENCOUNTER — Encounter: Payer: Self-pay | Admitting: Internal Medicine

## 2015-03-16 ENCOUNTER — Other Ambulatory Visit: Payer: Self-pay

## 2015-03-16 MED ORDER — HYDROCHLOROTHIAZIDE 12.5 MG PO CAPS: 12.5000 mg | ORAL_CAPSULE | Freq: Every day | ORAL | Status: DC

## 2015-03-16 MED ORDER — FENOFIBRATE 145 MG PO TABS: 145.0000 mg | ORAL_TABLET | Freq: Every day | ORAL | Status: DC

## 2015-03-16 MED ORDER — IRBESARTAN 300 MG PO TABS: ORAL_TABLET | ORAL | Status: DC

## 2015-03-23 ENCOUNTER — Other Ambulatory Visit: Payer: Self-pay

## 2015-03-23 MED ORDER — VITAMIN D (ERGOCALCIFEROL) 1.25 MG (50000 UNIT) PO CAPS
ORAL_CAPSULE | ORAL | Status: DC
Start: 2015-03-23 — End: 2015-03-24

## 2015-03-23 NOTE — Telephone Encounter (Signed)
Last VIT D level was in 2014. Ok to fill?

## 2015-03-24 ENCOUNTER — Other Ambulatory Visit: Payer: Self-pay | Admitting: Internal Medicine

## 2015-03-24 MED ORDER — VITAMIN D (ERGOCALCIFEROL) 1.25 MG (50000 UNIT) PO CAPS: ORAL_CAPSULE | ORAL | Status: DC

## 2015-04-01 DIAGNOSIS — C61 Malignant neoplasm of prostate: Secondary | ICD-10-CM | POA: Diagnosis not present

## 2015-04-08 ENCOUNTER — Encounter: Payer: Self-pay | Admitting: Gastroenterology

## 2015-04-08 ENCOUNTER — Ambulatory Visit (INDEPENDENT_AMBULATORY_CARE_PROVIDER_SITE_OTHER): Payer: Medicare Other

## 2015-04-08 DIAGNOSIS — Z23 Encounter for immunization: Secondary | ICD-10-CM | POA: Diagnosis not present

## 2015-04-08 DIAGNOSIS — C61 Malignant neoplasm of prostate: Secondary | ICD-10-CM | POA: Diagnosis not present

## 2015-04-30 DIAGNOSIS — H2513 Age-related nuclear cataract, bilateral: Secondary | ICD-10-CM | POA: Diagnosis not present

## 2015-05-20 ENCOUNTER — Ambulatory Visit (INDEPENDENT_AMBULATORY_CARE_PROVIDER_SITE_OTHER): Payer: Medicare Other | Admitting: Internal Medicine

## 2015-05-20 ENCOUNTER — Ambulatory Visit: Payer: Medicare Other | Admitting: Internal Medicine

## 2015-05-20 ENCOUNTER — Encounter: Payer: Self-pay | Admitting: Internal Medicine

## 2015-05-20 VITALS — BP 134/80 | HR 72 | Temp 98.1°F | Resp 16 | Ht 71.0 in | Wt 242.0 lb

## 2015-05-20 DIAGNOSIS — I6523 Occlusion and stenosis of bilateral carotid arteries: Secondary | ICD-10-CM | POA: Diagnosis not present

## 2015-05-20 DIAGNOSIS — E785 Hyperlipidemia, unspecified: Secondary | ICD-10-CM | POA: Diagnosis not present

## 2015-05-20 DIAGNOSIS — Z1159 Encounter for screening for other viral diseases: Secondary | ICD-10-CM | POA: Insufficient documentation

## 2015-05-20 MED ORDER — ATORVASTATIN CALCIUM 10 MG PO TABS
10.0000 mg | ORAL_TABLET | Freq: Every day | ORAL | Status: DC
Start: 2015-05-20 — End: 2016-05-08

## 2015-05-20 NOTE — Patient Instructions (Signed)

## 2015-05-20 NOTE — Progress Notes (Signed)
Pre visit review using our clinic review tool, if applicable. No additional management support is needed unless otherwise documented below in the visit note. 

## 2015-05-20 NOTE — Progress Notes (Signed)
Subjective:  Patient ID: Louis Kent, male    DOB: 03/26/1941  Age: 75 y.o. MRN: [id number]  CC: Hypertension and Hyperlipidemia   HPI Louis Kent presents for f/up - he feels well and offers no complaints.  Outpatient Prescriptions Prior to Visit  Medication Sig Dispense Refill  . aspirin 81 MG tablet Take 81 mg by mouth daily.      . calcium-vitamin D (OSCAL WITH D) 500-200 MG-UNIT per tablet Take 1 tablet by mouth daily.      . cyanocobalamin 2000 MCG tablet TAKE 1 TABLET (2,000 MCG TOTAL) BY MOUTH DAILY. 90 tablet 3  . hydrochlorothiazide (MICROZIDE) 12.5 MG capsule Take 1 capsule (12.5 mg total) by mouth daily. 90 capsule 3  . irbesartan (AVAPRO) 300 MG tablet TAKE 1 TABLET (300 MG TOTAL) BY MOUTH DAILY. 90 tablet 3  . multivitamin (THERAGRAN) per tablet Take 1 tablet by mouth daily.      . Omega-3 Fatty Acids (FISH OIL) 1000 MG CAPS Take by mouth.      . Vitamin D, Ergocalciferol, (DRISDOL) 50000 UNITS CAPS capsule TAKE 1 CAPSULE BY MOUTH ONCE A WEEK 12 capsule 3  . fenofibrate (TRICOR) 145 MG tablet Take 1 tablet (145 mg total) by mouth daily. 90 tablet 3   No facility-administered medications prior to visit.    ROS Review of Systems  Constitutional: Negative.  Negative for fever, chills, diaphoresis, appetite change and fatigue.  HENT: Negative.   Eyes: Negative.   Respiratory: Negative.  Negative for cough, choking, chest tightness, shortness of breath and stridor.   Cardiovascular: Negative.  Negative for chest pain, palpitations and leg swelling.  Gastrointestinal: Negative.  Negative for nausea, abdominal pain, diarrhea and constipation.  Endocrine: Negative.   Genitourinary: Negative.  Negative for dysuria, hematuria and difficulty urinating.  Musculoskeletal: Negative.  Negative for myalgias, back pain, joint swelling, arthralgias and neck pain.  Skin: Negative.  Negative for color change and rash.  Allergic/Immunologic: Negative.   Neurological: Negative.   Negative for dizziness, weakness, light-headedness and headaches.  Hematological: Negative.  Negative for adenopathy. Does not bruise/bleed easily.  Psychiatric/Behavioral: Negative.     Objective:  BP 134/80 mmHg  Pulse 72  Temp(Src) 98.1 F (36.7 C) (Oral)  Resp 16  Ht 5\' 11"  (1.803 m)  Wt 242 lb (109.77 kg)  BMI 33.77 kg/m2  SpO2 98%  BP Readings from Last 3 Encounters:  05/20/15 134/80  01/05/15 132/82  09/02/14 150/78    Wt Readings from Last 3 Encounters:  05/20/15 242 lb (109.77 kg)  01/05/15 240 lb (108.863 kg)  09/02/14 235 lb (106.595 kg)    Physical Exam  Constitutional: He is oriented to person, place, and time. No distress.  HENT:  Head: Normocephalic and atraumatic.  Mouth/Throat: Oropharynx is clear and moist. No oropharyngeal exudate.  Eyes: Conjunctivae are normal. Right eye exhibits no discharge. Left eye exhibits no discharge. No scleral icterus.  Neck: Normal range of motion. Neck supple. No JVD present. No tracheal deviation present. No thyromegaly present.  Cardiovascular: Normal rate, regular rhythm, normal heart sounds and intact distal pulses.  Exam reveals no gallop and no friction rub.   No murmur heard. Pulmonary/Chest: Effort normal and breath sounds normal. No stridor. No respiratory distress. He has no wheezes. He has no rales. He exhibits no tenderness.  Abdominal: Soft. Bowel sounds are normal. He exhibits no distension and no mass. There is no tenderness. There is no rebound and no guarding.  Musculoskeletal: Normal range of  motion. He exhibits no edema or tenderness.  Lymphadenopathy:    He has no cervical adenopathy.  Neurological: He is oriented to person, place, and time.  Skin: Skin is warm and dry. No rash noted. He is not diaphoretic. No erythema. No pallor.  Vitals reviewed.   Lab Results  Component Value Date   WBC 6.1 01/05/2015   HGB 13.3 01/05/2015   HCT 39.5 01/05/2015   PLT 224.0 01/05/2015   GLUCOSE 77 01/05/2015     CHOL 169 01/05/2015   TRIG 119.0 01/05/2015   HDL 68.20 01/05/2015   LDLDIRECT 86.5 10/26/2006   LDLCALC 77 01/05/2015   ALT 31 10/03/2013   AST 35 10/03/2013   NA 140 01/05/2015   K 4.5 01/05/2015   CL 105 01/05/2015   CREATININE 1.20 01/05/2015   BUN 32* 01/05/2015   CO2 26 01/05/2015   TSH 1.27 01/05/2015   PSA 0.01* 04/03/2013   HGBA1C 5.9 01/05/2015    No results found.  Assessment & Plan:   Nicanor was seen today for hypertension and hyperlipidemia.  Diagnoses and all orders for this visit:  Carotid atherosclerosis, bilateral- I have asked him to start a statin for risk reduction -     atorvastatin (LIPITOR) 10 MG tablet; Take 1 tablet (10 mg total) by mouth daily.  Hyperlipidemia with target LDL less than 130- he has not achieved his LDL goal, I have asked him to start a statin for risk reduction. He has been able to control his triglycerides with lifestyle modifications so he will stop taking fenofibrate -     atorvastatin (LIPITOR) 10 MG tablet; Take 1 tablet (10 mg total) by mouth daily.  Need for hepatitis C screening test -     Hepatitis C antibody; Future   I have discontinued Mr. Bumanglag fenofibrate. I am also having him start on atorvastatin. Additionally, I am having him maintain his aspirin, multivitamin, Fish Oil, calcium-vitamin D, cyanocobalamin, irbesartan, hydrochlorothiazide, and Vitamin D (Ergocalciferol).  Meds ordered this encounter  Medications  . atorvastatin (LIPITOR) 10 MG tablet    Sig: Take 1 tablet (10 mg total) by mouth daily.    Dispense:  90 tablet    Refill:  3     Follow-up: Return in about 4 months (around 09/17/2015).  Scarlette Calico, MD

## 2015-06-15 ENCOUNTER — Other Ambulatory Visit: Payer: Self-pay | Admitting: Internal Medicine

## 2015-06-17 DIAGNOSIS — Z8739 Personal history of other diseases of the musculoskeletal system and connective tissue: Secondary | ICD-10-CM | POA: Diagnosis not present

## 2015-06-17 DIAGNOSIS — Z471 Aftercare following joint replacement surgery: Secondary | ICD-10-CM | POA: Diagnosis not present

## 2015-06-17 DIAGNOSIS — Z96643 Presence of artificial hip joint, bilateral: Secondary | ICD-10-CM | POA: Diagnosis not present

## 2015-06-17 DIAGNOSIS — I10 Essential (primary) hypertension: Secondary | ICD-10-CM | POA: Diagnosis not present

## 2015-06-17 DIAGNOSIS — E785 Hyperlipidemia, unspecified: Secondary | ICD-10-CM | POA: Diagnosis not present

## 2015-06-17 DIAGNOSIS — Z09 Encounter for follow-up examination after completed treatment for conditions other than malignant neoplasm: Secondary | ICD-10-CM | POA: Diagnosis not present

## 2015-09-17 ENCOUNTER — Encounter: Payer: Self-pay | Admitting: Internal Medicine

## 2015-09-17 ENCOUNTER — Ambulatory Visit (INDEPENDENT_AMBULATORY_CARE_PROVIDER_SITE_OTHER): Payer: Medicare Other | Admitting: Internal Medicine

## 2015-09-17 ENCOUNTER — Other Ambulatory Visit (INDEPENDENT_AMBULATORY_CARE_PROVIDER_SITE_OTHER): Payer: Medicare Other

## 2015-09-17 VITALS — BP 118/70 | HR 70 | Temp 98.3°F | Resp 16 | Ht 71.0 in | Wt 232.0 lb

## 2015-09-17 DIAGNOSIS — H6123 Impacted cerumen, bilateral: Secondary | ICD-10-CM | POA: Diagnosis not present

## 2015-09-17 DIAGNOSIS — E785 Hyperlipidemia, unspecified: Secondary | ICD-10-CM | POA: Diagnosis not present

## 2015-09-17 DIAGNOSIS — Z1159 Encounter for screening for other viral diseases: Secondary | ICD-10-CM | POA: Diagnosis not present

## 2015-09-17 DIAGNOSIS — E781 Pure hyperglyceridemia: Secondary | ICD-10-CM

## 2015-09-17 DIAGNOSIS — I10 Essential (primary) hypertension: Secondary | ICD-10-CM

## 2015-09-17 DIAGNOSIS — E559 Vitamin D deficiency, unspecified: Secondary | ICD-10-CM | POA: Insufficient documentation

## 2015-09-17 DIAGNOSIS — I6523 Occlusion and stenosis of bilateral carotid arteries: Secondary | ICD-10-CM

## 2015-09-17 DIAGNOSIS — M899 Disorder of bone, unspecified: Secondary | ICD-10-CM

## 2015-09-17 DIAGNOSIS — M949 Disorder of cartilage, unspecified: Secondary | ICD-10-CM

## 2015-09-17 LAB — CBC WITH DIFFERENTIAL/PLATELET
BASOS PCT: 0.8 % (ref 0.0–3.0)
Basophils Absolute: 0 10*3/uL (ref 0.0–0.1)
EOS ABS: 0.2 10*3/uL (ref 0.0–0.7)
EOS PCT: 3.2 % (ref 0.0–5.0)
HEMATOCRIT: 43.4 % (ref 39.0–52.0)
HEMOGLOBIN: 14.6 g/dL (ref 13.0–17.0)
LYMPHS PCT: 25.3 % (ref 12.0–46.0)
Lymphs Abs: 1.5 10*3/uL (ref 0.7–4.0)
MCHC: 33.7 g/dL (ref 30.0–36.0)
MCV: 90.6 fl (ref 78.0–100.0)
MONO ABS: 0.5 10*3/uL (ref 0.1–1.0)
Monocytes Relative: 9.1 % (ref 3.0–12.0)
NEUTROS ABS: 3.6 10*3/uL (ref 1.4–7.7)
Neutrophils Relative %: 61.6 % (ref 43.0–77.0)
PLATELETS: 209 10*3/uL (ref 150.0–400.0)
RBC: 4.78 Mil/uL (ref 4.22–5.81)
RDW: 13.6 % (ref 11.5–15.5)
WBC: 5.8 10*3/uL (ref 4.0–10.5)

## 2015-09-17 LAB — LIPID PANEL
CHOLESTEROL: 142 mg/dL (ref 0–200)
HDL: 64.3 mg/dL (ref >39.00)
LDL Cholesterol: 60 mg/dL (ref 0–99)
NonHDL: 78.14
TRIGLYCERIDES: 93 mg/dL (ref 0.0–149.0)
Total CHOL/HDL Ratio: 2
VLDL: 18.6 mg/dL (ref 0.0–40.0)

## 2015-09-17 LAB — BASIC METABOLIC PANEL
BUN: 25 mg/dL — AB (ref 6–23)
CHLORIDE: 103 meq/L (ref 96–112)
CO2: 26 meq/L (ref 19–32)
CREATININE: 1.02 mg/dL (ref 0.40–1.50)
Calcium: 9.4 mg/dL (ref 8.4–10.5)
GFR: 75.27 mL/min (ref >60.00)
Glucose, Bld: 116 mg/dL — ABNORMAL HIGH (ref 70–99)
POTASSIUM: 4.7 meq/L (ref 3.5–5.1)
Sodium: 139 mEq/L (ref 135–145)

## 2015-09-17 LAB — VITAMIN D 25 HYDROXY (VIT D DEFICIENCY, FRACTURES): VITD: 83.55 ng/mL (ref 30.00–100.00)

## 2015-09-17 NOTE — Patient Instructions (Signed)
Hypertension Hypertension, commonly called high blood pressure, is when the force of blood pumping through your arteries is too strong. Your arteries are the blood vessels that carry blood from your heart throughout your body. A blood pressure reading consists of a higher number over a lower number, such as 110/72. The higher number (systolic) is the pressure inside your arteries when your heart pumps. The lower number (diastolic) is the pressure inside your arteries when your heart relaxes. Ideally you want your blood pressure below 120/80. Hypertension forces your heart to work harder to pump blood. Your arteries may become narrow or stiff. Having untreated or uncontrolled hypertension can cause heart attack, stroke, kidney disease, and other problems. RISK FACTORS Some risk factors for high blood pressure are controllable. Others are not.  Risk factors you cannot control include:   Race. You may be at higher risk if you are African American.  Age. Risk increases with age.  Gender. Men are at higher risk than women before age 45 years. After age 65, women are at higher risk than men. Risk factors you can control include:  Not getting enough exercise or physical activity.  Being overweight.  Getting too much fat, sugar, calories, or salt in your diet.  Drinking too much alcohol. SIGNS AND SYMPTOMS Hypertension does not usually cause signs or symptoms. Extremely high blood pressure (hypertensive crisis) may cause headache, anxiety, shortness of breath, and nosebleed. DIAGNOSIS To check if you have hypertension, your health care provider will measure your blood pressure while you are seated, with your arm held at the level of your heart. It should be measured at least twice using the same arm. Certain conditions can cause a difference in blood pressure between your right and left arms. A blood pressure reading that is higher than normal on one occasion does not mean that you need treatment. If  it is not clear whether you have high blood pressure, you may be asked to return on a different day to have your blood pressure checked again. Or, you may be asked to monitor your blood pressure at home for 1 or more weeks. TREATMENT Treating high blood pressure includes making lifestyle changes and possibly taking medicine. Living a healthy lifestyle can help lower high blood pressure. You may need to change some of your habits. Lifestyle changes may include:  Following the DASH diet. This diet is high in fruits, vegetables, and whole grains. It is low in salt, red meat, and added sugars.  Keep your sodium intake below 2,300 mg per day.  Getting at least 30-45 minutes of aerobic exercise at least 4 times per week.  Losing weight if necessary.  Not smoking.  Limiting alcoholic beverages.  Learning ways to reduce stress. Your health care provider may prescribe medicine if lifestyle changes are not enough to get your blood pressure under control, and if one of the following is true:  You are 18-59 years of age and your systolic blood pressure is above 140.  You are 60 years of age or older, and your systolic blood pressure is above 150.  Your diastolic blood pressure is above 90.  You have diabetes, and your systolic blood pressure is over 140 or your diastolic blood pressure is over 90.  You have kidney disease and your blood pressure is above 140/90.  You have heart disease and your blood pressure is above 140/90. Your personal target blood pressure may vary depending on your medical conditions, your age, and other factors. HOME CARE INSTRUCTIONS    Have your blood pressure rechecked as directed by your health care provider.   Take medicines only as directed by your health care provider. Follow the directions carefully. Blood pressure medicines must be taken as prescribed. The medicine does not work as well when you skip doses. Skipping doses also puts you at risk for  problems.  Do not smoke.   Monitor your blood pressure at home as directed by your health care provider. SEEK MEDICAL CARE IF:   You think you are having a reaction to medicines taken.  You have recurrent headaches or feel dizzy.  You have swelling in your ankles.  You have trouble with your vision. SEEK IMMEDIATE MEDICAL CARE IF:  You develop a severe headache or confusion.  You have unusual weakness, numbness, or feel faint.  You have severe chest or abdominal pain.  You vomit repeatedly.  You have trouble breathing. MAKE SURE YOU:   Understand these instructions.  Will watch your condition.  Will get help right away if you are not doing well or get worse.   This information is not intended to replace advice given to you by your health care provider. Make sure you discuss any questions you have with your health care provider.   Document Released: 05/02/2005 Document Revised: 09/16/2014 Document Reviewed: 02/22/2013 Elsevier Interactive Patient Education 2016 [employer]  

## 2015-09-17 NOTE — Progress Notes (Signed)
Subjective:  Patient ID: Louis Kent, male    DOB: 02/07/1941  Age: 75 y.o. MRN: [id number]  CC: Hypertension and Hyperlipidemia   HPI LESTAT KAMPFER presents for follow-up on hypertension and hyperlipidemia. He complains today of a gradual decrease in his level of hearing over the last few months. He went to [facility] to have his hearing evaluated but they said there was too much wax in his ear. He wants to have the wax removed. He tells me his blood pressures been well controlled on the combination of hydrochlorothiazide and irbesartan. He denies any episodes of headache, blurred vision, chest pain, shortness of breath, dyspnea on exertion, edema, or palpitations. He is also due for fasting lipid panel and tells me his tolerating atorvastatin well with no myalgias.  Outpatient Prescriptions Prior to Visit  Medication Sig Dispense Refill  . aspirin 81 MG tablet Take 81 mg by mouth daily.      Marland Kitchen atorvastatin (LIPITOR) 10 MG tablet Take 1 tablet (10 mg total) by mouth daily. 90 tablet 3  . calcium-vitamin D (OSCAL WITH D) 500-200 MG-UNIT per tablet Take 1 tablet by mouth daily.      . cyanocobalamin 2000 MCG tablet TAKE 1 TABLET (2,000 MCG TOTAL) BY MOUTH DAILY. 90 tablet 3  . hydrochlorothiazide (MICROZIDE) 12.5 MG capsule Take 1 capsule (12.5 mg total) by mouth daily. 90 capsule 3  . irbesartan (AVAPRO) 300 MG tablet TAKE 1 TABLET (300 MG TOTAL) BY MOUTH DAILY. 90 tablet 3  . multivitamin (THERAGRAN) per tablet Take 1 tablet by mouth daily.      . Omega-3 Fatty Acids (FISH OIL) 1000 MG CAPS Take by mouth.      . Vitamin D, Ergocalciferol, (DRISDOL) 50000 UNITS CAPS capsule TAKE 1 CAPSULE BY MOUTH ONCE A WEEK 12 capsule 3  . cyanocobalamin 2000 MCG tablet TAKE 1 TABLET (2,000 MCG TOTAL) BY MOUTH DAILY. 90 tablet 3   No facility-administered medications prior to visit.    ROS Review of Systems  Constitutional: Negative.   HENT: Positive for hearing loss. Negative for postnasal drip,  rhinorrhea, sinus pressure, sneezing, sore throat and tinnitus.   Eyes: Negative.  Negative for visual disturbance.  Respiratory: Negative.  Negative for cough, choking, chest tightness, shortness of breath and stridor.   Cardiovascular: Negative.  Negative for chest pain, palpitations and leg swelling.  Gastrointestinal: Negative.  Negative for nausea, vomiting, abdominal pain, diarrhea and constipation.  Endocrine: Negative.   Genitourinary: Negative.   Musculoskeletal: Negative.  Negative for myalgias, back pain, arthralgias and neck pain.  Skin: Negative.  Negative for color change and rash.  Allergic/Immunologic: Negative.   Neurological: Negative.  Negative for dizziness, tremors, weakness, light-headedness and numbness.  Hematological: Negative.  Negative for adenopathy. Does not bruise/bleed easily.  Psychiatric/Behavioral: Negative.     Objective:  BP 118/70 mmHg  Pulse 70  Temp(Src) 98.3 F (36.8 C) (Oral)  Resp 16  Ht 5\' 11"  (1.803 m)  Wt 232 lb (105.235 kg)  BMI 32.37 kg/m2  SpO2 95%  BP Readings from Last 3 Encounters:  09/17/15 118/70  05/20/15 134/80  01/05/15 132/82    Wt Readings from Last 3 Encounters:  09/17/15 232 lb (105.235 kg)  05/20/15 242 lb (109.77 kg)  01/05/15 240 lb (108.863 kg)    Physical Exam  Constitutional: He is oriented to person, place, and time. No distress.  HENT:  Right Ear: Hearing, tympanic membrane and external ear normal. A foreign body (cerumen impaction) is present.  Left Ear: Hearing, tympanic membrane and external ear normal. A foreign body (cerumen impaction) is present.  Mouth/Throat: Oropharynx is clear and moist. No oropharyngeal exudate.  I put Colace in both ears and then used an ear wick to irrigate both EAC's. The cerumen impaction was easily resolved. Post examination shows that all the wax has been removed, his ear canals and tympanic membrane are normal after the irrigation.  Eyes: Conjunctivae are normal. Right  eye exhibits no discharge. Left eye exhibits no discharge. No scleral icterus.  Neck: Normal range of motion. Neck supple. No JVD present. No tracheal deviation present. No thyromegaly present.  Cardiovascular: Normal rate, regular rhythm, normal heart sounds and intact distal pulses.  Exam reveals no gallop and no friction rub.   No murmur heard. Pulmonary/Chest: Effort normal and breath sounds normal. No stridor. No respiratory distress. He has no wheezes. He has no rales. He exhibits no tenderness.  Abdominal: Soft. Bowel sounds are normal. He exhibits no distension and no mass. There is no tenderness. There is no rebound and no guarding.  Musculoskeletal: Normal range of motion. He exhibits no edema or tenderness.  Lymphadenopathy:    He has no cervical adenopathy.  Neurological: He is oriented to person, place, and time.  Skin: Skin is warm and dry. No rash noted. He is not diaphoretic. No erythema. No pallor.  Vitals reviewed.   Lab Results  Component Value Date   WBC 5.8 09/17/2015   HGB 14.6 09/17/2015   HCT 43.4 09/17/2015   PLT 209.0 09/17/2015   GLUCOSE 116* 09/17/2015   CHOL 142 09/17/2015   TRIG 93.0 09/17/2015   HDL 64.30 09/17/2015   LDLDIRECT 86.5 10/26/2006   LDLCALC 60 09/17/2015   ALT 31 10/03/2013   AST 35 10/03/2013   NA 139 09/17/2015   K 4.7 09/17/2015   CL 103 09/17/2015   CREATININE 1.02 09/17/2015   BUN 25* 09/17/2015   CO2 26 09/17/2015   TSH 1.27 01/05/2015   PSA 0.01* 04/03/2013   HGBA1C 5.9 01/05/2015    No results found.  Assessment & Plan:   Hoskie was seen today for hypertension and hyperlipidemia.  Diagnoses and all orders for this visit:  Carotid atherosclerosis, bilateral- he has had no complications related to this, we'll continue risk factor modification with LDL reduction with statins, blood pressure control with an ARB, and aspirin therapy. -     Lipid panel; Future  Essential hypertension- his blood pressures well controlled,  electrolytes and renal function are stable. -     Basic metabolic panel; Future -     CBC with Differential/Platelet; Future  Hyperlipidemia with target LDL less than 130- he is achieved his LDL goal is doing well on the statin. 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 -     VITAMIN D 25 Hydroxy (Vit-D Deficiency, Fractures); Future  I am having Mr. Monty maintain his aspirin, multivitamin, Fish Oil, calcium-vitamin D, cyanocobalamin, irbesartan, hydrochlorothiazide, Vitamin D (Ergocalciferol), and atorvastatin.  No orders of the defined types were placed in this encounter.     Follow-up: Return in about 6 months (around 03/19/2016).  Sanda Linger, MD

## 2015-09-17 NOTE — Progress Notes (Signed)
Pre visit review using our clinic review tool, if applicable. No additional management support is needed unless otherwise documented below in the visit note. 

## 2015-09-18 ENCOUNTER — Encounter: Payer: Self-pay | Admitting: Internal Medicine

## 2015-09-18 DIAGNOSIS — H6120 Impacted cerumen, unspecified ear: Secondary | ICD-10-CM | POA: Insufficient documentation

## 2015-09-18 LAB — HEPATITIS C ANTIBODY: HCV AB: NEGATIVE

## 2016-01-04 IMAGING — MR MR [PERSON_NAME]*[PERSON_NAME]* W/O CM
7 series · 40 of 40 positions shown · non-contrast
Comparison: None.

CLINICAL DATA: 73-year-old with discolored right index finger
nailbed for 3 months. No known injury. Initial encounter.

EXAM:
MRI OF THE RIGHT FINGERS WITHOUT CONTRAST
TECHNIQUE: Multiplanar, multisequence MR imaging was performed. No intravenous
contrast was administered.

[Series 10: T1 · coronal · 2.5mm · 0.27mm/px · 3 of 12 slices shown (1 of 3)]
[im 1/12]
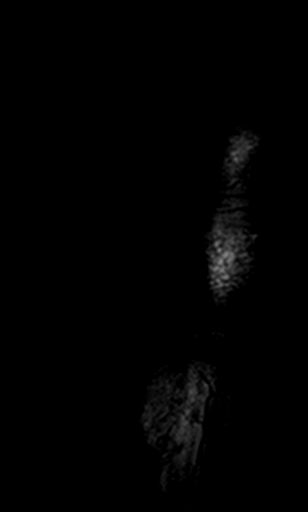
[im 6/12]
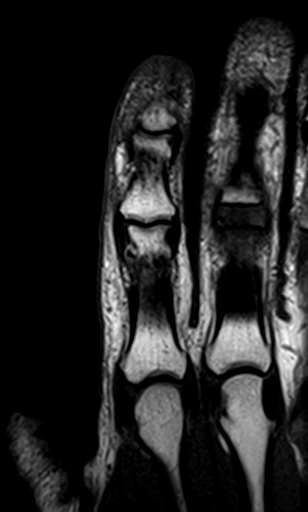
[im 12/12]
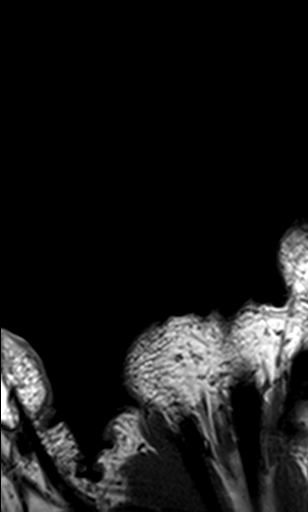

[Series 11: STIR · coronal · 2.5mm · 0.36mm/px · 3 of 12 slices shown]
[im 1/12]
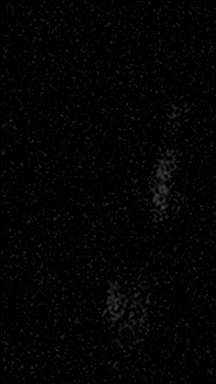
[im 6/12]
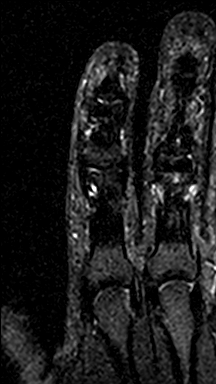
[im 12/12]
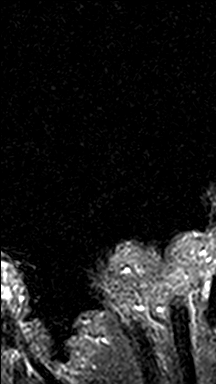

[Series 12: T2 fat-sat · coronal · 2.5mm · 0.27mm/px · 4 of 12 slices shown (1 of 3)]
[im 1/12]
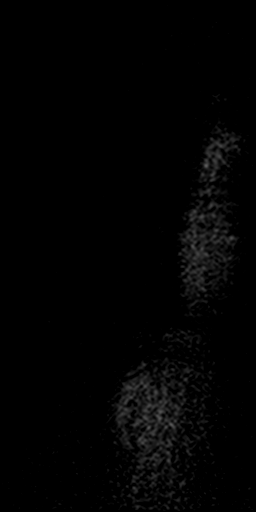
[im 4/12]
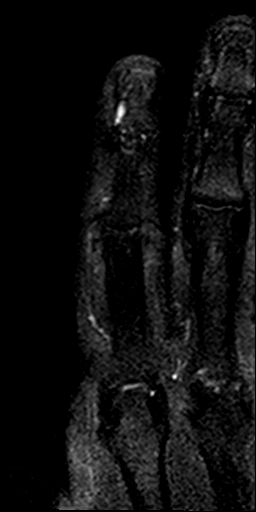
[im 8/12]
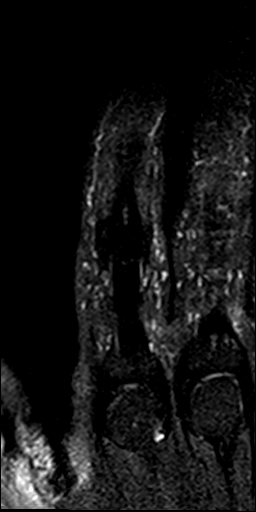
[im 12/12]
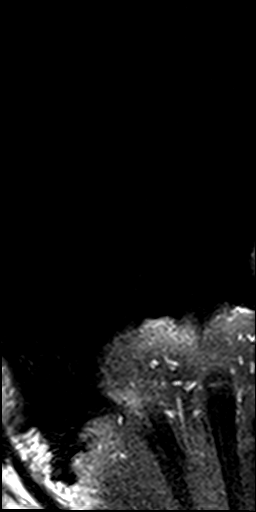

[Series 13: T1 · sagittal · 2.0mm · 0.23mm/px · 4 of 14 slices shown (2 of 3)]
[im 1/14]
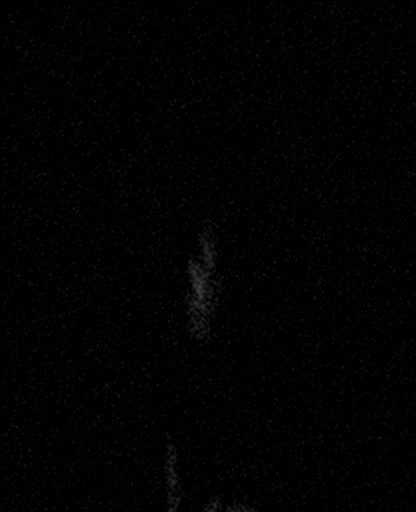
[im 5/14]
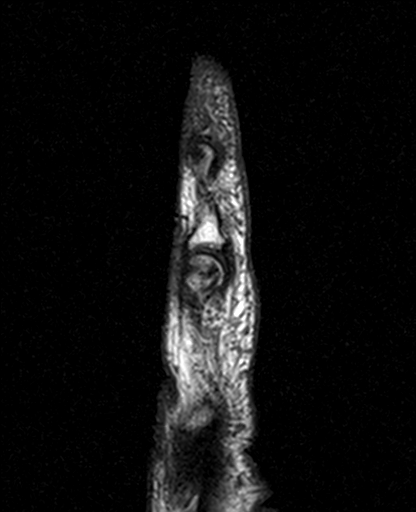
[im 9/14]
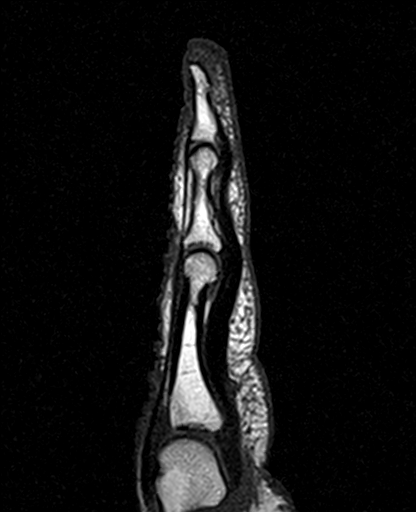
[im 14/14]
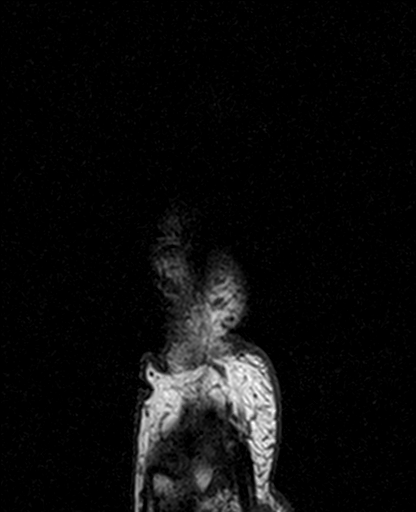

[Series 14: T2 fat-sat · sagittal · 2.0mm · 0.27mm/px · 4 of 14 slices shown (2 of 3)]
[im 1/14]
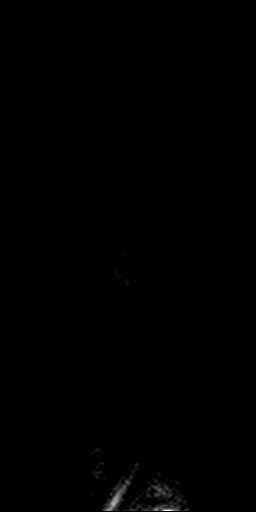
[im 5/14]
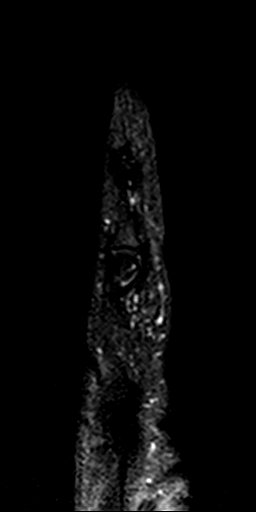
[im 9/14]
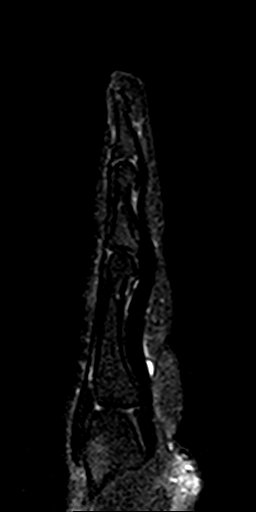
[im 14/14]
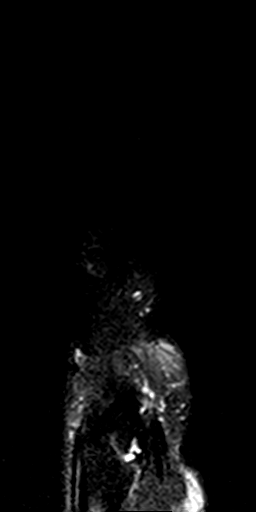

[Series 15: T1 · axial · 2.5mm · 0.26mm/px · z∈[-76,+7]mm · 11 of 36 slices shown (3 of 3)]
[im 1/36]
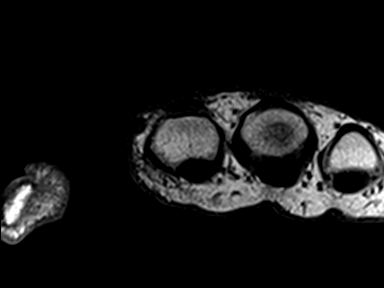
[im 4/36]
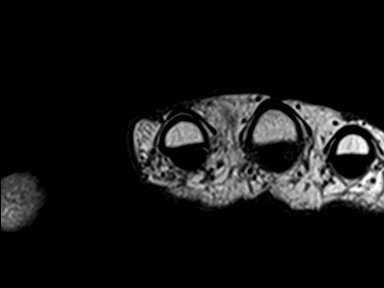
[im 8/36]
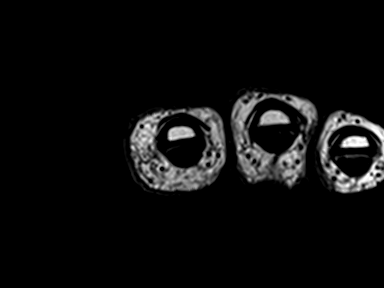
[im 11/36]
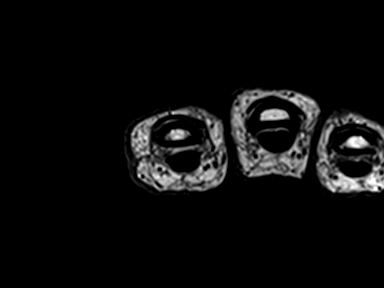
[im 15/36]
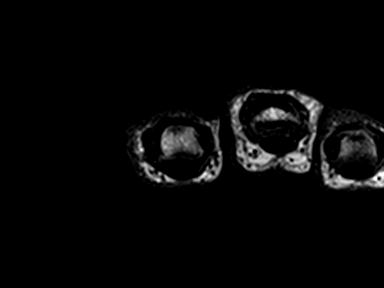
[im 18/36]
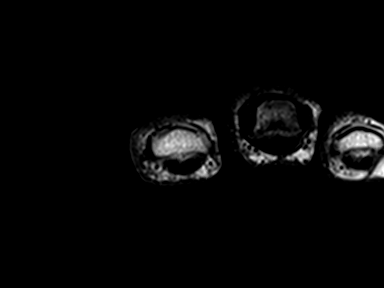
[im 22/36]
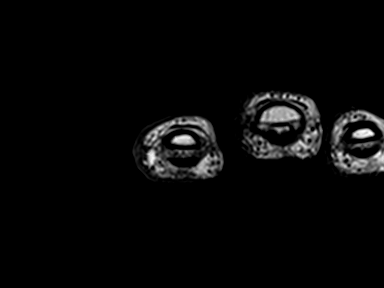
[im 25/36]
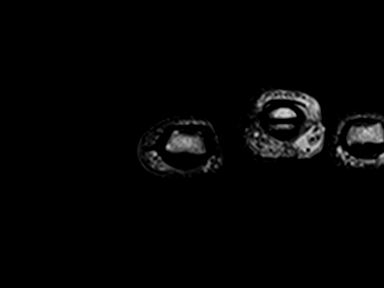
[im 29/36]
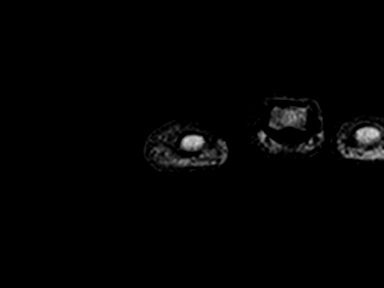
[im 32/36]
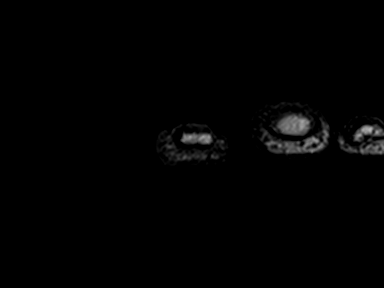
[im 36/36]
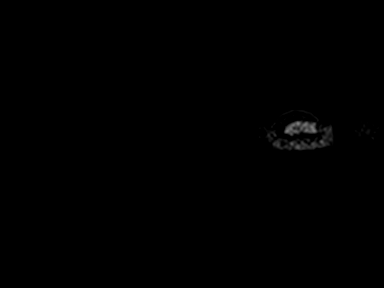

[Series 16: T2 fat-sat · axial · 2.5mm · 0.26mm/px · z∈[-73,+10]mm · 11 of 36 slices shown (3 of 3)]
[im 1/36]
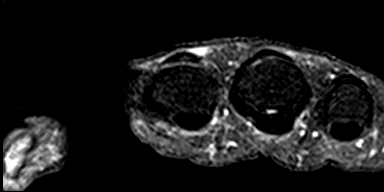
[im 4/36]
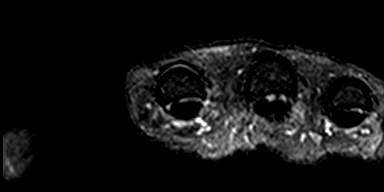
[im 8/36]
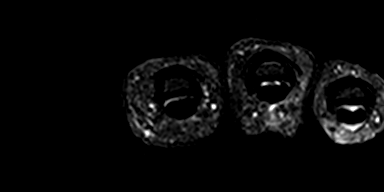
[im 11/36]
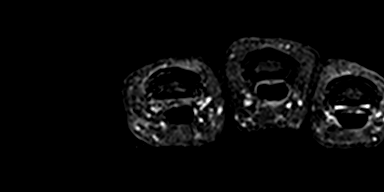
[im 15/36]
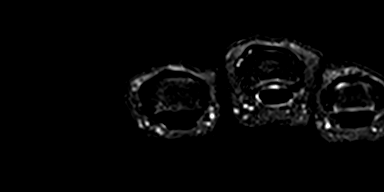
[im 18/36]
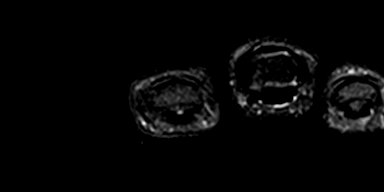
[im 22/36]
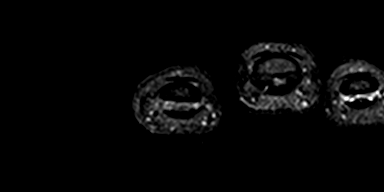
[im 25/36]
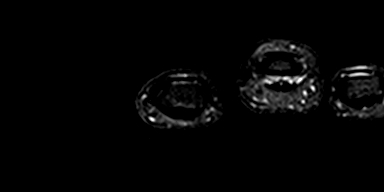
[im 29/36]
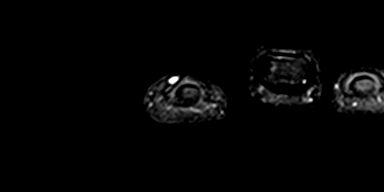
[im 32/36]
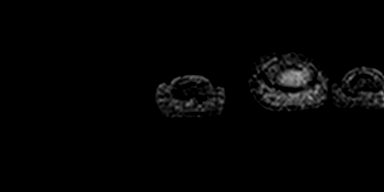
[im 36/36]
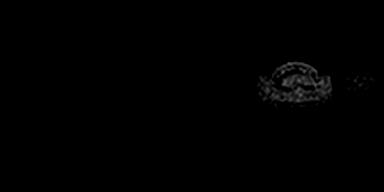

[40 of 40 positions shown; findings below may reference images not displayed]

FINDINGS: Imaging is targeted to the index finger, but includes portions of
the long and ring fingers. No contrast was given in. There is a
tubular focus of T2 hyperintensity along the radial aspect of the
index finger nail bed. This measures approximately 6 x 3 x 2 mm.
This finding is not well seen on the T1 weighted images. There is no
osseous erosion or adjacent bone marrow edema.

No other masses, fluid collections or inflammatory changes are
identified. There are mild interphalangeal degenerative changes
within the index and long fingers. No erosive changes are seen. The
finger tendons appear unremarkable.
IMPRESSION: Small T2 hyperintense nail bed lesion involving the index finger,
most likely a glomus tumor. No osseous erosion or marrow edema
identified.

## 2016-02-21 ENCOUNTER — Other Ambulatory Visit: Payer: Self-pay | Admitting: Internal Medicine

## 2016-03-03 ENCOUNTER — Ambulatory Visit (INDEPENDENT_AMBULATORY_CARE_PROVIDER_SITE_OTHER): Payer: Medicare Other

## 2016-03-03 DIAGNOSIS — Z23 Encounter for immunization: Secondary | ICD-10-CM | POA: Diagnosis not present

## 2016-03-04 ENCOUNTER — Other Ambulatory Visit: Payer: Self-pay | Admitting: Internal Medicine

## 2016-03-23 ENCOUNTER — Encounter: Payer: Self-pay | Admitting: Internal Medicine

## 2016-03-23 ENCOUNTER — Ambulatory Visit (INDEPENDENT_AMBULATORY_CARE_PROVIDER_SITE_OTHER): Payer: Medicare Other | Admitting: Internal Medicine

## 2016-03-23 ENCOUNTER — Other Ambulatory Visit (INDEPENDENT_AMBULATORY_CARE_PROVIDER_SITE_OTHER): Payer: Medicare Other

## 2016-03-23 VITALS — BP 138/68 | HR 78 | Temp 98.3°F | Resp 16 | Ht 71.0 in | Wt 238.8 lb

## 2016-03-23 DIAGNOSIS — R739 Hyperglycemia, unspecified: Secondary | ICD-10-CM

## 2016-03-23 DIAGNOSIS — I10 Essential (primary) hypertension: Secondary | ICD-10-CM | POA: Diagnosis not present

## 2016-03-23 DIAGNOSIS — I6523 Occlusion and stenosis of bilateral carotid arteries: Secondary | ICD-10-CM | POA: Diagnosis not present

## 2016-03-23 LAB — BASIC METABOLIC PANEL
BUN: 18 mg/dL (ref 6–23)
CALCIUM: 9.3 mg/dL (ref 8.4–10.5)
CO2: 29 mEq/L (ref 19–32)
Chloride: 102 mEq/L (ref 96–112)
Creatinine, Ser: 1 mg/dL (ref 0.40–1.50)
GFR: 76.9 mL/min (ref >60.00)
GLUCOSE: 154 mg/dL — AB (ref 70–99)
POTASSIUM: 4.6 meq/L (ref 3.5–5.1)
SODIUM: 138 meq/L (ref 135–145)

## 2016-03-23 LAB — HEMOGLOBIN A1C: HEMOGLOBIN A1C: 6 % (ref 4.6–6.5)

## 2016-03-23 NOTE — Patient Instructions (Signed)
Hypertension Hypertension, commonly called high blood pressure, is when the force of blood pumping through your arteries is too strong. Your arteries are the blood vessels that carry blood from your heart throughout your body. A blood pressure reading consists of a higher number over a lower number, such as 110/72. The higher number (systolic) is the pressure inside your arteries when your heart pumps. The lower number (diastolic) is the pressure inside your arteries when your heart relaxes. Ideally you want your blood pressure below 120/80. Hypertension forces your heart to work harder to pump blood. Your arteries may become narrow or stiff. Having untreated or uncontrolled hypertension can cause heart attack, stroke, kidney disease, and other problems. RISK FACTORS Some risk factors for high blood pressure are controllable. Others are not.  Risk factors you cannot control include:   Race. You may be at higher risk if you are African American.  Age. Risk increases with age.  Gender. Men are at higher risk than women before age 45 years. After age 65, women are at higher risk than men. Risk factors you can control include:  Not getting enough exercise or physical activity.  Being overweight.  Getting too much fat, sugar, calories, or salt in your diet.  Drinking too much alcohol. SIGNS AND SYMPTOMS Hypertension does not usually cause signs or symptoms. Extremely high blood pressure (hypertensive crisis) may cause headache, anxiety, shortness of breath, and nosebleed. DIAGNOSIS To check if you have hypertension, your health care provider will measure your blood pressure while you are seated, with your arm held at the level of your heart. It should be measured at least twice using the same arm. Certain conditions can cause a difference in blood pressure between your right and left arms. A blood pressure reading that is higher than normal on one occasion does not mean that you need treatment. If  it is not clear whether you have high blood pressure, you may be asked to return on a different day to have your blood pressure checked again. Or, you may be asked to monitor your blood pressure at home for 1 or more weeks. TREATMENT Treating high blood pressure includes making lifestyle changes and possibly taking medicine. Living a healthy lifestyle can help lower high blood pressure. You may need to change some of your habits. Lifestyle changes may include:  Following the DASH diet. This diet is high in fruits, vegetables, and whole grains. It is low in salt, red meat, and added sugars.  Keep your sodium intake below 2,300 mg per day.  Getting at least 30-45 minutes of aerobic exercise at least 4 times per week.  Losing weight if necessary.  Not smoking.  Limiting alcoholic beverages.  Learning ways to reduce stress. Your health care provider may prescribe medicine if lifestyle changes are not enough to get your blood pressure under control, and if one of the following is true:  You are 18-59 years of age and your systolic blood pressure is above 140.  You are 60 years of age or older, and your systolic blood pressure is above 150.  Your diastolic blood pressure is above 90.  You have diabetes, and your systolic blood pressure is over 140 or your diastolic blood pressure is over 90.  You have kidney disease and your blood pressure is above 140/90.  You have heart disease and your blood pressure is above 140/90. Your personal target blood pressure may vary depending on your medical conditions, your age, and other factors. HOME CARE INSTRUCTIONS    Have your blood pressure rechecked as directed by your health care provider.   Take medicines only as directed by your health care provider. Follow the directions carefully. Blood pressure medicines must be taken as prescribed. The medicine does not work as well when you skip doses. Skipping doses also puts you at risk for  problems.  Do not smoke.   Monitor your blood pressure at home as directed by your health care provider. SEEK MEDICAL CARE IF:   You think you are having a reaction to medicines taken.  You have recurrent headaches or feel dizzy.  You have swelling in your ankles.  You have trouble with your vision. SEEK IMMEDIATE MEDICAL CARE IF:  You develop a severe headache or confusion.  You have unusual weakness, numbness, or feel faint.  You have severe chest or abdominal pain.  You vomit repeatedly.  You have trouble breathing. MAKE SURE YOU:   Understand these instructions.  Will watch your condition.  Will get help right away if you are not doing well or get worse.   This information is not intended to replace advice given to you by your health care provider. Make sure you discuss any questions you have with your health care provider.   Document Released: 05/02/2005 Document Revised: 09/16/2014 Document Reviewed: 02/22/2013 Elsevier Interactive Patient Education 2016 [employer]  

## 2016-03-23 NOTE — Progress Notes (Signed)
Subjective:  Patient ID: Louis Kent, male    DOB: [birth year]  Age: 75 y.o. MRN: [id number]  CC: Hypertension   HPI Louis Kent presents for a blood pressure check. He tells me his blood pressure has been well controlled. He has had no recent episodes of headache/blurred vision/chest pain/shortness of breath/palpitations/edema/or fatigue. He does complain of about 4 pound weight gain.  Outpatient Medications Prior to Visit  Medication Sig Dispense Refill  . aspirin 81 MG tablet Take 81 mg by mouth daily.      Marland Kitchen atorvastatin (LIPITOR) 10 MG tablet Take 1 tablet (10 mg total) by mouth daily. 90 tablet 3  . calcium-vitamin D (OSCAL WITH D) 500-200 MG-UNIT per tablet Take 1 tablet by mouth daily.      . cyanocobalamin 2000 MCG tablet TAKE 1 TABLET (2,000 MCG TOTAL) BY MOUTH DAILY. 90 tablet 3  . hydrochlorothiazide (MICROZIDE) 12.5 MG capsule Take 1 capsule (12.5 mg total) by mouth daily. 90 capsule 3  . irbesartan (AVAPRO) 300 MG tablet Take 1 tablet (300 mg total) by mouth daily. TAKE 1 TABLET (300 MG TOTAL) BY MOUTH DAILY. 90 tablet 3  . Omega-3 Fatty Acids (FISH OIL) 1000 MG CAPS Take by mouth.      . Vitamin D, Ergocalciferol, (DRISDOL) 50000 units CAPS capsule TAKE 1 CAPSULE BY MOUTH ONCE A WEEK 12 capsule 3  . multivitamin (THERAGRAN) per tablet Take 1 tablet by mouth daily.       No facility-administered medications prior to visit.     ROS Review of Systems  Constitutional: Positive for unexpected weight change. Negative for appetite change, chills, diaphoresis and fatigue.  HENT: Negative.  Negative for trouble swallowing.   Eyes: Negative for visual disturbance.  Respiratory: Negative for cough, choking, shortness of breath and stridor.   Cardiovascular: Negative.  Negative for chest pain, palpitations and leg swelling.  Gastrointestinal: Negative for abdominal pain, constipation, diarrhea, nausea and vomiting.  Endocrine: Negative for polydipsia, polyphagia and  polyuria.  Genitourinary: Negative.  Negative for decreased urine volume, difficulty urinating and urgency.  Musculoskeletal: Negative.  Negative for arthralgias, back pain, myalgias and neck pain.  Skin: Negative.   Neurological: Negative.  Negative for dizziness, weakness and light-headedness.  Hematological: Negative for adenopathy. Does not bruise/bleed easily.  Psychiatric/Behavioral: Negative.     Objective:  BP 138/68 (BP Location: Left Arm, Patient Position: Sitting, Cuff Size: Normal)   Pulse 78   Temp 98.3 F (36.8 C) (Oral)   Resp 16   Ht 5\' 11"  (1.803 m)   Wt 238 lb 12 oz (108.3 kg)   SpO2 94%   BMI 33.30 kg/m   BP Readings from Last 3 Encounters:  03/23/16 138/68  09/17/15 118/70  05/20/15 134/80    Wt Readings from Last 3 Encounters:  03/23/16 238 lb 12 oz (108.3 kg)  09/17/15 232 lb (105.2 kg)  05/20/15 242 lb (109.8 kg)    Physical Exam  Constitutional: He is oriented to person, place, and time. No distress.  HENT:  Mouth/Throat: Oropharynx is clear and moist. No oropharyngeal exudate.  Eyes: Conjunctivae are normal. Right eye exhibits no discharge. Left eye exhibits no discharge. No scleral icterus.  Neck: Normal range of motion. Neck supple. No JVD present. No tracheal deviation present. No thyromegaly present.  Cardiovascular: Normal rate, regular rhythm, normal heart sounds and intact distal pulses.  Exam reveals no gallop and no friction rub.   No murmur heard. Pulmonary/Chest: Effort normal and breath sounds normal. No  stridor. No respiratory distress. He has no wheezes. He has no rales. He exhibits no tenderness.  Abdominal: Soft. Bowel sounds are normal. He exhibits no distension and no mass. There is no tenderness. There is no rebound and no guarding.  Musculoskeletal: Normal range of motion. He exhibits no edema, tenderness or deformity.  Lymphadenopathy:    He has no cervical adenopathy.  Neurological: He is oriented to person, place, and time.   Skin: Skin is warm and dry. No rash noted. He is not diaphoretic. No erythema. No pallor.  Vitals reviewed.   Lab Results  Component Value Date   WBC 5.8 09/17/2015   HGB 14.6 09/17/2015   HCT 43.4 09/17/2015   PLT 209.0 09/17/2015   GLUCOSE 154 (H) 03/23/2016   CHOL 142 09/17/2015   TRIG 93.0 09/17/2015   HDL 64.30 09/17/2015   LDLDIRECT 86.5 10/26/2006   LDLCALC 60 09/17/2015   ALT 31 10/03/2013   AST 35 10/03/2013   NA 138 03/23/2016   K 4.6 03/23/2016   CL 102 03/23/2016   CREATININE 1.00 03/23/2016   BUN 18 03/23/2016   CO2 29 03/23/2016   TSH 1.27 01/05/2015   PSA 0.01 (L) 04/03/2013   HGBA1C 6.0 03/23/2016    No results found.  Assessment & Plan:   Louis Kent was seen today for hypertension.  Diagnoses and all orders for this visit:  Essential hypertension- his blood pressure is well-controlled, electrolytes and renal function are stable. -     Basic metabolic panel; Future  Hyperglycemia - his blood sugar is slightly elevated consistent with prediabetes, no medications are needed at this time. He was encouraged to work on his lifestyle modifications. -     Basic metabolic panel; Future -     Hemoglobin A1c; Future   I have discontinued Louis Kent multivitamin. I am also having him maintain his aspirin, Fish Oil, calcium-vitamin D, cyanocobalamin, atorvastatin, Vitamin D (Ergocalciferol), irbesartan, and hydrochlorothiazide.  No orders of the defined types were placed in this encounter.    Follow-up: Return in about 6 months (around 09/20/2016).  Scarlette Calico, MD

## 2016-03-23 NOTE — Progress Notes (Signed)
Pre visit review using our clinic review tool, if applicable. No additional management support is needed unless otherwise documented below in the visit note. 

## 2016-03-28 DIAGNOSIS — C61 Malignant neoplasm of prostate: Secondary | ICD-10-CM | POA: Diagnosis not present

## 2016-03-30 DIAGNOSIS — N5201 Erectile dysfunction due to arterial insufficiency: Secondary | ICD-10-CM | POA: Diagnosis not present

## 2016-03-30 DIAGNOSIS — C61 Malignant neoplasm of prostate: Secondary | ICD-10-CM | POA: Diagnosis not present

## 2016-05-08 ENCOUNTER — Other Ambulatory Visit: Payer: Self-pay | Admitting: Internal Medicine

## 2016-05-08 DIAGNOSIS — I6523 Occlusion and stenosis of bilateral carotid arteries: Secondary | ICD-10-CM

## 2016-05-08 DIAGNOSIS — E785 Hyperlipidemia, unspecified: Secondary | ICD-10-CM

## 2016-06-01 ENCOUNTER — Ambulatory Visit: Payer: Medicare Other

## 2016-06-04 ENCOUNTER — Telehealth: Payer: Self-pay | Admitting: Internal Medicine

## 2016-06-04 NOTE — Telephone Encounter (Signed)
Spoke patient to r/s awv. Pt stated he will call office back to r/s his appt.

## 2016-06-15 DIAGNOSIS — Z96643 Presence of artificial hip joint, bilateral: Secondary | ICD-10-CM | POA: Diagnosis not present

## 2016-06-15 DIAGNOSIS — I10 Essential (primary) hypertension: Secondary | ICD-10-CM | POA: Diagnosis not present

## 2016-06-15 DIAGNOSIS — E785 Hyperlipidemia, unspecified: Secondary | ICD-10-CM | POA: Diagnosis not present

## 2016-06-15 DIAGNOSIS — Z471 Aftercare following joint replacement surgery: Secondary | ICD-10-CM | POA: Diagnosis not present

## 2016-06-15 DIAGNOSIS — Z9889 Other specified postprocedural states: Secondary | ICD-10-CM | POA: Diagnosis not present

## 2016-06-15 DIAGNOSIS — Z8739 Personal history of other diseases of the musculoskeletal system and connective tissue: Secondary | ICD-10-CM | POA: Diagnosis not present

## 2016-08-29 ENCOUNTER — Other Ambulatory Visit: Payer: Self-pay | Admitting: Internal Medicine

## 2016-09-04 ENCOUNTER — Other Ambulatory Visit: Payer: Self-pay | Admitting: Internal Medicine

## 2016-09-16 ENCOUNTER — Other Ambulatory Visit: Payer: Self-pay | Admitting: Internal Medicine

## 2016-10-25 ENCOUNTER — Other Ambulatory Visit (INDEPENDENT_AMBULATORY_CARE_PROVIDER_SITE_OTHER): Payer: Medicare Other

## 2016-10-25 ENCOUNTER — Encounter: Payer: Self-pay | Admitting: Internal Medicine

## 2016-10-25 ENCOUNTER — Ambulatory Visit (INDEPENDENT_AMBULATORY_CARE_PROVIDER_SITE_OTHER): Payer: Medicare Other | Admitting: Internal Medicine

## 2016-10-25 VITALS — BP 128/80 | HR 70 | Temp 98.1°F | Ht 71.0 in | Wt 235.5 lb

## 2016-10-25 DIAGNOSIS — I10 Essential (primary) hypertension: Secondary | ICD-10-CM

## 2016-10-25 DIAGNOSIS — E785 Hyperlipidemia, unspecified: Secondary | ICD-10-CM

## 2016-10-25 DIAGNOSIS — E538 Deficiency of other specified B group vitamins: Secondary | ICD-10-CM

## 2016-10-25 DIAGNOSIS — E781 Pure hyperglyceridemia: Secondary | ICD-10-CM

## 2016-10-25 DIAGNOSIS — R7303 Prediabetes: Secondary | ICD-10-CM

## 2016-10-25 LAB — CBC WITH DIFFERENTIAL/PLATELET
BASOS PCT: 0.8 % (ref 0.0–3.0)
Basophils Absolute: 0.1 10*3/uL (ref 0.0–0.1)
Eosinophils Absolute: 0.3 10*3/uL (ref 0.0–0.7)
Eosinophils Relative: 4.8 % (ref 0.0–5.0)
HCT: 43.6 % (ref 39.0–52.0)
Hemoglobin: 14.6 g/dL (ref 13.0–17.0)
LYMPHS ABS: 1.5 10*3/uL (ref 0.7–4.0)
Lymphocytes Relative: 21.2 % (ref 12.0–46.0)
MCHC: 33.5 g/dL (ref 30.0–36.0)
MCV: 92.8 fl (ref 78.0–100.0)
MONO ABS: 0.6 10*3/uL (ref 0.1–1.0)
Monocytes Relative: 9 % (ref 3.0–12.0)
NEUTROS PCT: 64.2 % (ref 43.0–77.0)
Neutro Abs: 4.5 10*3/uL (ref 1.4–7.7)
Platelets: 190 10*3/uL (ref 150.0–400.0)
RBC: 4.7 Mil/uL (ref 4.22–5.81)
RDW: 13.6 % (ref 11.5–15.5)
WBC: 7 10*3/uL (ref 4.0–10.5)

## 2016-10-25 LAB — COMPREHENSIVE METABOLIC PANEL
ALK PHOS: 91 U/L (ref 39–117)
ALT: 29 U/L (ref 0–53)
AST: 21 U/L (ref 0–37)
Albumin: 4.2 g/dL (ref 3.5–5.2)
BUN: 24 mg/dL — AB (ref 6–23)
CO2: 31 mEq/L (ref 19–32)
Calcium: 9.4 mg/dL (ref 8.4–10.5)
Chloride: 105 mEq/L (ref 96–112)
Creatinine, Ser: 0.95 mg/dL (ref 0.40–1.50)
GFR: 81.47 mL/min (ref >60.00)
GLUCOSE: 111 mg/dL — AB (ref 70–99)
Potassium: 4.9 mEq/L (ref 3.5–5.1)
SODIUM: 141 meq/L (ref 135–145)
TOTAL PROTEIN: 6.8 g/dL (ref 6.0–8.3)
Total Bilirubin: 0.6 mg/dL (ref 0.2–1.2)

## 2016-10-25 LAB — LIPID PANEL
Cholesterol: 150 mg/dL (ref 0–200)
HDL: 62.8 mg/dL (ref >39.00)
LDL Cholesterol: 66 mg/dL (ref 0–99)
NONHDL: 87.16
Total CHOL/HDL Ratio: 2
Triglycerides: 105 mg/dL (ref 0.0–149.0)
VLDL: 21 mg/dL (ref 0.0–40.0)

## 2016-10-25 LAB — TSH: TSH: 1.9 u[IU]/mL (ref 0.35–4.50)

## 2016-10-25 LAB — HEMOGLOBIN A1C: Hgb A1c MFr Bld: 6.3 % (ref 4.6–6.5)

## 2016-10-25 MED ORDER — ZOSTER VAC RECOMB ADJUVANTED 50 MCG/0.5ML IM SUSR: 0.5000 mL | Freq: Once | INTRAMUSCULAR | 1 refills | Status: AC

## 2016-10-25 NOTE — Patient Instructions (Signed)

## 2016-10-25 NOTE — Progress Notes (Signed)
Subjective:  Patient ID: Louis Kent, male    DOB: [birth year]  Age: 76 y.o. MRN: [id number]  CC: Hypertension and Hyperlipidemia   HPI GEORGIA BARIA presents for f/up - He complains of weight gain but offers no other complaints. He has been active and has had no recent episodes of DOE, CP, fatigue, palpitations, or edema.  Outpatient Medications Prior to Visit  Medication Sig Dispense Refill  . aspirin 81 MG tablet Take 81 mg by mouth daily.      Marland Kitchen atorvastatin (LIPITOR) 10 MG tablet TAKE 1 TABLET (10 MG TOTAL) BY MOUTH DAILY. 90 tablet 3  . calcium-vitamin D (OSCAL WITH D) 500-200 MG-UNIT per tablet Take 1 tablet by mouth daily.      . cyanocobalamin 2000 MCG tablet TAKE 1 TABLET (2,000 MCG TOTAL) BY MOUTH DAILY. 90 tablet 3  . hydrochlorothiazide (MICROZIDE) 12.5 MG capsule Take 1 capsule (12.5 mg total) by mouth daily. 90 capsule 3  . irbesartan (AVAPRO) 300 MG tablet Take 1 tablet (300 mg total) by mouth daily. TAKE 1 TABLET (300 MG TOTAL) BY MOUTH DAILY. 90 tablet 3  . Omega-3 Fatty Acids (FISH OIL) 1000 MG CAPS Take by mouth.      . Vitamin D, Ergocalciferol, (DRISDOL) 50000 units CAPS capsule TAKE 1 CAPSULE BY MOUTH ONCE A WEEK 12 capsule 3  . cyanocobalamin 2000 MCG tablet TAKE 1 TABLET (2,000 MCG TOTAL) BY MOUTH DAILY. 90 tablet 3  . cyanocobalamin 2000 MCG tablet TAKE 1 TABLET (2,000 MCG TOTAL) BY MOUTH DAILY. 90 tablet 3  . cyanocobalamin 2000 MCG tablet TAKE 1 TABLET (2,000 MCG TOTAL) BY MOUTH DAILY. 90 tablet 3   No facility-administered medications prior to visit.     ROS Review of Systems  Constitutional: Positive for unexpected weight change. Negative for activity change, appetite change, diaphoresis and fatigue.  HENT: Negative.  Negative for trouble swallowing.   Eyes: Negative for photophobia and visual disturbance.  Respiratory: Negative for apnea, cough, chest tightness, shortness of breath and wheezing.   Cardiovascular: Negative for chest pain,  palpitations and leg swelling.  Gastrointestinal: Negative for abdominal pain, constipation, diarrhea, nausea and vomiting.  Endocrine: Negative.  Negative for polydipsia, polyphagia and polyuria.  Genitourinary: Negative.  Negative for difficulty urinating, frequency and urgency.  Musculoskeletal: Negative.  Negative for myalgias.  Skin: Negative.  Negative for color change and rash.  Neurological: Negative.  Negative for dizziness, weakness and headaches.  Hematological: Negative.  Negative for adenopathy. Does not bruise/bleed easily.  Psychiatric/Behavioral: Negative.     Objective:  BP 128/80 (BP Location: Left Arm, Patient Position: Sitting, Cuff Size: Normal)   Pulse 70   Temp 98.1 F (36.7 C) (Oral)   Ht 5\' 11"  (1.803 m)   Wt 235 lb 8 oz (106.8 kg)   SpO2 97%   BMI 32.85 kg/m   BP Readings from Last 3 Encounters:  10/25/16 128/80  03/23/16 138/68  09/17/15 118/70    Wt Readings from Last 3 Encounters:  10/25/16 235 lb 8 oz (106.8 kg)  03/23/16 238 lb 12 oz (108.3 kg)  09/17/15 232 lb (105.2 kg)    Physical Exam  Constitutional: He is oriented to person, place, and time. No distress.  HENT:  Mouth/Throat: Oropharynx is clear and moist. No oropharyngeal exudate.  Eyes: Conjunctivae are normal. Right eye exhibits no discharge. Left eye exhibits no discharge. No scleral icterus.  Neck: Normal range of motion. Neck supple. No JVD present. No tracheal deviation present. No thyromegaly  present.  Cardiovascular: Normal rate, regular rhythm and intact distal pulses.  Exam reveals no gallop.   No murmur heard. Pulmonary/Chest: Effort normal and breath sounds normal. No respiratory distress. He has no wheezes. He has no rales. He exhibits no tenderness.  Abdominal: Soft. Bowel sounds are normal. He exhibits no distension and no mass. There is no tenderness. There is no rebound and no guarding.  Musculoskeletal: Normal range of motion. He exhibits no edema or tenderness.    Lymphadenopathy:    He has no cervical adenopathy.  Neurological: He is alert and oriented to person, place, and time. He has normal reflexes.  Skin: Skin is warm and dry. No rash noted. He is not diaphoretic. No erythema. No pallor.  Vitals reviewed.   Lab Results  Component Value Date   WBC 7.0 10/25/2016   HGB 14.6 10/25/2016   HCT 43.6 10/25/2016   PLT 190.0 10/25/2016   GLUCOSE 111 (H) 10/25/2016   CHOL 150 10/25/2016   TRIG 105.0 10/25/2016   HDL 62.80 10/25/2016   LDLDIRECT 86.5 10/26/2006   LDLCALC 66 10/25/2016   ALT 29 10/25/2016   AST 21 10/25/2016   NA 141 10/25/2016   K 4.9 10/25/2016   CL 105 10/25/2016   CREATININE 0.95 10/25/2016   BUN 24 (H) 10/25/2016   CO2 31 10/25/2016   TSH 1.90 10/25/2016   PSA 0.01 (L) 04/03/2013   HGBA1C 6.3 10/25/2016    No results found.  Assessment & Plan:   Stuart was seen today for hypertension and hyperlipidemia.  Diagnoses and all orders for this visit:  Essential hypertension- His blood pressure is well-controlled, electrolytes and renal function are normal. -     Comprehensive metabolic panel; Future  B12 nutritional deficiency- will continue B12 replacement orally -     CBC with Differential/Platelet; Future  Prediabetes- his A1c is up to 6.3%, he is prediabetic, no medications are needed at this time, he agrees to work on his lifestyle modifications. -     Comprehensive metabolic panel; Future -     Hemoglobin A1c; Future  Hyperlipidemia with target LDL less than 130- he has achieved his LDL goal is doing well on the statin. 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 -     Lipid panel; Future  Other orders -     Zoster Vac Recomb Adjuvanted [facility]) injection; Inject 0.5 mLs into the muscle once.   I am having Mr. Marcil start on Zoster Vac Recomb Adjuvanted. I am also having him maintain  his aspirin, Fish Oil, calcium-vitamin D, Vitamin D (Ergocalciferol), irbesartan, hydrochlorothiazide, atorvastatin, and cyanocobalamin.  Meds ordered this encounter  Medications  . Zoster Vac Recomb Adjuvanted [facility]) injection    Sig: Inject 0.5 mLs into the muscle once.    Dispense:  1 each    Refill:  1     Follow-up: Return in about 6 months (around 04/26/2017).  Scarlette Calico, MD

## 2016-12-04 ENCOUNTER — Other Ambulatory Visit: Payer: Self-pay | Admitting: Internal Medicine

## 2016-12-05 DIAGNOSIS — H25013 Cortical age-related cataract, bilateral: Secondary | ICD-10-CM | POA: Diagnosis not present

## 2016-12-05 DIAGNOSIS — H2513 Age-related nuclear cataract, bilateral: Secondary | ICD-10-CM | POA: Diagnosis not present

## 2016-12-13 NOTE — Progress Notes (Addendum)
Subjective:   Louis Kent is a 76 y.o. male who presents for Medicare Annual/Subsequent preventive examination.  Review of Systems:  No ROS.  Medicare Wellness Visit. Additional risk factors are reflected in the social history.    Sleep patterns: feels rested on waking, gets up 1 times nightly to void and sleeps 7-8 hours nightly.    Home Safety/Smoke Alarms: Feels safe in home. Smoke alarms in place.  Living environment; residence and Firearm Safety: 1-story house/ trailer, no firearms. Lives alone, no needs for DME, good support system Seat Belt Safety/Bike Helmet: Wears seat belt.   Counseling:   Eye Exam- appointment yearly, Dr. Gers[facility]- appointment every 6 months  Male:   CCS- Last 10/10/07, 10 year recall     PSA-  Lab Results  Component Value Date   PSA 0.01 (L) 04/03/2013   PSA 0.01 (L) 03/24/2009       Objective:    Vitals: There were no vitals taken for this visit.  There is no height or weight on file to calculate BMI.  Tobacco History  Smoking Status  . Never Smoker  Smokeless Tobacco  . Never Used     Counseling given: Not Answered   Past Medical History:  Diagnosis Date  . Hyperlipidemia   . Hypertension   . S/P prostatectomy    Past Surgical History:  Procedure Laterality Date  . ANKLE ARTHROSCOPY W/ OPEN REPAIR    . PROSTATE SURGERY    . TONSILLECTOMY     Family History  Problem Relation Age of Onset  . Lung cancer Unknown   . Stroke Mother   . Diabetes Mother   . Cancer Father        lung cancer   History  Sexual Activity  . Sexual activity: Not Currently    Outpatient Encounter Prescriptions as of 12/14/2016  Medication Sig  . aspirin 81 MG tablet Take 81 mg by mouth daily.    Marland Kitchen atorvastatin (LIPITOR) 10 MG tablet TAKE 1 TABLET (10 MG TOTAL) BY MOUTH DAILY.  . calcium-vitamin D (OSCAL WITH D) 500-200 MG-UNIT per tablet Take 1 tablet by mouth daily.    . cyanocobalamin 2000 MCG tablet TAKE 1 TABLET (2,000 MCG TOTAL)  BY MOUTH DAILY.  . hydrochlorothiazide (MICROZIDE) 12.5 MG capsule Take 1 capsule (12.5 mg total) by mouth daily.  . irbesartan (AVAPRO) 300 MG tablet TAKE 1 TABLET (300 MG TOTAL) BY MOUTH DAILY. TAKE 1 TABLET (300 MG TOTAL) BY MOUTH DAILY.  Marland Kitchen Omega-3 Fatty Acids (FISH OIL) 1000 MG CAPS Take by mouth.    . Vitamin D, Ergocalciferol, (DRISDOL) 50000 units CAPS capsule TAKE 1 CAPSULE BY MOUTH ONCE A WEEK   No facility-administered encounter medications on file as of 12/14/2016.     Activities of Daily Living No flowsheet data found.  Patient Care Team: [name], MD as PCP - General (Internal Medicine)   Assessment:    Physical assessment deferred to PCP.  Exercise Activities and Dietary recommendations   Diet (meal preparation, eat out, water intake, caffeinated beverages, dairy products, fruits and vegetables): in general, a "healthy" diet  , well balanced, eats a variety of fruits and vegetables daily, limits salt, fat/cholesterol, sugar, caffeine, drinks 6-8 glasses of water daily.   Goals    None     Fall Risk Fall Risk  10/25/2016 09/17/2015 04/28/2014 04/10/2013  Falls in the past year? No No No No   Depression Screen PHQ 2/9 Scores 10/25/2016 09/17/2015 04/10/2013  PHQ -  2 Score 0 0 0    Cognitive Function       Ad8 score reviewed for issues:  Issues making decisions: no  Less interest in hobbies / activities: no  Repeats questions, stories (family complaining): no  Trouble using ordinary gadgets (microwave, computer, phone):no  Forgets the month or year: no  Mismanaging finances: no  Remembering appts: no  Daily problems with thinking and/or memory: no Ad8 score is= 0    Immunization History  Administered Date(s) Administered  . Influenza Split 02/17/2011, 03/22/2012  . Influenza Whole 05/16/2004, 03/16/2007, 01/30/2008, 03/10/2009, 02/04/2010  . Influenza, High Dose Seasonal PF 03/05/2013, 04/08/2015, 03/03/2016  . Influenza,inj,Quad PF,36+ Mos  03/14/2014  . Pneumococcal Conjugate-13 12/12/2013  . Pneumococcal Polysaccharide-23 05/16/2004, 09/02/2014  . Td 05/16/2002  . Tdap 09/13/2012  . Zoster 02/04/2010   Screening Tests Health Maintenance  Topic Date Due  . INFLUENZA VACCINE  12/14/2016  . TETANUS/TDAP  09/14/2022  . PNA vac Low Risk Adult  Completed      Plan:    Continue doing brain stimulating activities (puzzles, reading, adult coloring books, staying active) to keep memory sharp.   Continue to eat heart healthy diet (full of fruits, vegetables, whole grains, lean protein, water--limit salt, fat, and sugar intake) and increase physical activity as tolerated.  I have personally reviewed and noted the following in the patient's chart:   . Medical and social history . Use of alcohol, tobacco or illicit drugs  . Current medications and supplements . Functional ability and status . Nutritional status . Physical activity . Advanced directives . List of other physicians . Vitals . Screenings to include cognitive, depression, and falls . Referrals and appointments  In addition, I have reviewed and discussed with patient certain preventive protocols, quality metrics, and best practice recommendations. A written personalized care plan for preventive services as well as general preventive health recommendations were provided to patient.     Michiel Cowboy, RN  12/13/2016  Medical screening examination/treatment/procedure(s) were performed by non-physician practitioner and as supervising physician I was immediately available for consultation/collaboration. I agree with above. Scarlette Calico, MD

## 2016-12-13 NOTE — Progress Notes (Signed)
Pre visit review using our clinic review tool, if applicable. No additional management support is needed unless otherwise documented below in the visit note. 

## 2016-12-14 ENCOUNTER — Ambulatory Visit (INDEPENDENT_AMBULATORY_CARE_PROVIDER_SITE_OTHER): Payer: Medicare Other | Admitting: *Deleted

## 2016-12-14 VITALS — BP 132/74 | HR 73 | Resp 20 | Ht 71.0 in | Wt 238.0 lb

## 2016-12-14 DIAGNOSIS — Z0000 Encounter for general adult medical examination without abnormal findings: Secondary | ICD-10-CM | POA: Diagnosis not present

## 2016-12-14 NOTE — Patient Instructions (Signed)
Continue doing brain stimulating activities (puzzles, reading, adult coloring books, staying active) to keep memory sharp.   Continue to eat heart healthy diet (full of fruits, vegetables, whole grains, lean protein, water--limit salt, fat, and sugar intake) and increase physical activity as tolerated.   Louis Kent , Thank you for taking time to come for your Medicare Wellness Visit. I appreciate your ongoing commitment to your health goals. Please review the following plan we discussed and let me know if I can assist you in the future.   These are the goals we discussed: Goals    . lost weight          Eat healthy, portion control, and exercise.       This is a list of the screening recommended for you and due dates:  Health Maintenance  Topic Date Due  . Flu Shot  12/14/2016  . Tetanus Vaccine  09/14/2022  . Pneumonia vaccines  Completed

## 2017-01-24 ENCOUNTER — Other Ambulatory Visit: Payer: Self-pay | Admitting: Internal Medicine

## 2017-02-28 ENCOUNTER — Ambulatory Visit (INDEPENDENT_AMBULATORY_CARE_PROVIDER_SITE_OTHER): Payer: Medicare Other | Admitting: General Practice

## 2017-02-28 ENCOUNTER — Other Ambulatory Visit: Payer: Self-pay | Admitting: Internal Medicine

## 2017-02-28 DIAGNOSIS — Z23 Encounter for immunization: Secondary | ICD-10-CM

## 2017-04-24 ENCOUNTER — Other Ambulatory Visit: Payer: Self-pay | Admitting: Internal Medicine

## 2017-04-24 DIAGNOSIS — E785 Hyperlipidemia, unspecified: Secondary | ICD-10-CM

## 2017-04-24 DIAGNOSIS — I6523 Occlusion and stenosis of bilateral carotid arteries: Secondary | ICD-10-CM

## 2017-05-30 ENCOUNTER — Other Ambulatory Visit: Payer: Self-pay | Admitting: Internal Medicine

## 2017-05-31 DIAGNOSIS — C61 Malignant neoplasm of prostate: Secondary | ICD-10-CM | POA: Diagnosis not present

## 2017-05-31 LAB — PSA: PSA: 0.01

## 2017-06-04 ENCOUNTER — Other Ambulatory Visit: Payer: Self-pay | Admitting: Internal Medicine

## 2017-06-05 NOTE — Telephone Encounter (Signed)
Pt needs a follow up appt with PCP. Will send rx when pt makes his appt.

## 2017-06-05 NOTE — Telephone Encounter (Signed)
Pt is scheduled to come in on Thursday and said he would be out of this medication after tomorrow.

## 2017-06-05 NOTE — Telephone Encounter (Signed)
erx has been sent.  

## 2017-06-07 DIAGNOSIS — N5201 Erectile dysfunction due to arterial insufficiency: Secondary | ICD-10-CM | POA: Diagnosis not present

## 2017-06-07 DIAGNOSIS — Z8546 Personal history of malignant neoplasm of prostate: Secondary | ICD-10-CM | POA: Diagnosis not present

## 2017-06-08 ENCOUNTER — Other Ambulatory Visit (INDEPENDENT_AMBULATORY_CARE_PROVIDER_SITE_OTHER): Payer: Medicare Other

## 2017-06-08 ENCOUNTER — Ambulatory Visit (INDEPENDENT_AMBULATORY_CARE_PROVIDER_SITE_OTHER): Payer: Medicare Other | Admitting: Internal Medicine

## 2017-06-08 ENCOUNTER — Encounter: Payer: Self-pay | Admitting: Internal Medicine

## 2017-06-08 VITALS — BP 130/60 | HR 76 | Temp 98.9°F | Resp 16 | Ht 71.0 in | Wt 234.0 lb

## 2017-06-08 DIAGNOSIS — R7303 Prediabetes: Secondary | ICD-10-CM

## 2017-06-08 DIAGNOSIS — I10 Essential (primary) hypertension: Secondary | ICD-10-CM | POA: Diagnosis not present

## 2017-06-08 LAB — HEMOGLOBIN A1C: Hgb A1c MFr Bld: 6.4 % (ref 4.6–6.5)

## 2017-06-08 LAB — BASIC METABOLIC PANEL
BUN: 22 mg/dL (ref 6–23)
CO2: 29 mEq/L (ref 19–32)
Calcium: 9.3 mg/dL (ref 8.4–10.5)
Chloride: 99 mEq/L (ref 96–112)
Creatinine, Ser: 0.94 mg/dL (ref 0.40–1.50)
GFR: 82.33 mL/min (ref >60.00)
Glucose, Bld: 109 mg/dL — ABNORMAL HIGH (ref 70–99)
POTASSIUM: 4.3 meq/L (ref 3.5–5.1)
SODIUM: 136 meq/L (ref 135–145)

## 2017-06-08 NOTE — Progress Notes (Signed)
Subjective:  Patient ID: Louis Kent, male    DOB: 08/28/1940  Age: 77 y.o. MRN: [id number]  CC: Hypertension   HPI [name] presents for a BP check - He feels well and offers no complaints.  He remains very active and has had no recent episodes of DOE, CP, palpitations, edema, or fatigue.  Outpatient Medications Prior to Visit  Medication Sig Dispense Refill  . aspirin 81 MG tablet Take 81 mg by mouth daily.      Marland Kitchen atorvastatin (LIPITOR) 10 MG tablet TAKE 1 TABLET (10 MG TOTAL) BY MOUTH DAILY. 90 tablet 1  . calcium-vitamin D (OSCAL WITH D) 500-200 MG-UNIT per tablet Take 1 tablet by mouth daily.      . cyanocobalamin 2000 MCG tablet TAKE 1 TABLET (2,000 MCG TOTAL) BY MOUTH DAILY. 90 tablet 3  . hydrochlorothiazide (MICROZIDE) 12.5 MG capsule Take 1 capsule (12.5 mg total) by mouth daily. 30 capsule 0  . irbesartan (AVAPRO) 300 MG tablet TAKE 1 TABLET (300 MG TOTAL) BY MOUTH DAILY. TAKE 1 TABLET (300 MG TOTAL) BY MOUTH DAILY. 90 tablet 1  . Omega-3 Fatty Acids (FISH OIL) 1000 MG CAPS Take by mouth.      . Vitamin D, Ergocalciferol, (DRISDOL) 50000 units CAPS capsule TAKE 1 CAPSULE BY MOUTH ONCE A WEEK 12 capsule 3   No facility-administered medications prior to visit.     ROS Review of Systems  Constitutional: Negative for diaphoresis, fatigue and unexpected weight change.  HENT: Negative.  Negative for trouble swallowing.   Eyes: Negative for visual disturbance.  Respiratory: Negative for cough, chest tightness, shortness of breath and wheezing.   Cardiovascular: Negative for chest pain, palpitations and leg swelling.  Gastrointestinal: Negative for abdominal pain, constipation, diarrhea, nausea and vomiting.  Endocrine: Negative for polydipsia, polyphagia and polyuria.  Genitourinary: Negative.  Negative for difficulty urinating.  Musculoskeletal: Negative for arthralgias and myalgias.  Skin: Negative for color change and rash.  Allergic/Immunologic: Negative.     Neurological: Negative.  Negative for dizziness and light-headedness.  Hematological: Negative for adenopathy. Does not bruise/bleed easily.  Psychiatric/Behavioral: Negative.     Objective:  BP 130/60 (BP Location: Left Arm, Patient Position: Sitting, Cuff Size: Large)   Pulse 76   Temp 98.9 F (37.2 C) (Oral)   Resp 16   Ht 5\' 11"  (1.803 m)   Wt 234 lb (106.1 kg)   SpO2 94%   BMI 32.64 kg/m   BP Readings from Last 3 Encounters:  06/08/17 130/60  12/14/16 132/74  10/25/16 128/80    Wt Readings from Last 3 Encounters:  06/08/17 234 lb (106.1 kg)  12/14/16 238 lb (108 kg)  10/25/16 235 lb 8 oz (106.8 kg)    Physical Exam  Constitutional: He is oriented to person, place, and time. No distress.  HENT:  Mouth/Throat: Oropharynx is clear and moist. No oropharyngeal exudate.  Eyes: Conjunctivae are normal. Left eye exhibits no discharge. No scleral icterus.  Neck: Neck supple. No JVD present. No thyromegaly present.  Cardiovascular: Normal rate, regular rhythm and normal heart sounds. Exam reveals no gallop.  No murmur heard. Pulmonary/Chest: Effort normal and breath sounds normal. No respiratory distress. He has no wheezes. He has no rales.  Abdominal: Soft. Bowel sounds are normal. He exhibits no distension and no mass.  Musculoskeletal: He exhibits no edema, tenderness or deformity.  Lymphadenopathy:    He has no cervical adenopathy.  Neurological: He is alert and oriented to person, place, and time.  Skin: Skin is warm and dry. No rash noted. He is not diaphoretic. No erythema. No pallor.  Vitals reviewed.   Lab Results  Component Value Date   WBC 7.0 10/25/2016   HGB 14.6 10/25/2016   HCT 43.6 10/25/2016   PLT 190.0 10/25/2016   GLUCOSE 109 (H) 06/08/2017   CHOL 150 10/25/2016   TRIG 105.0 10/25/2016   HDL 62.80 10/25/2016   LDLDIRECT 86.5 10/26/2006   LDLCALC 66 10/25/2016   ALT 29 10/25/2016   AST 21 10/25/2016   NA 136 06/08/2017   K 4.3 06/08/2017    CL 99 06/08/2017   CREATININE 0.94 06/08/2017   BUN 22 06/08/2017   CO2 29 06/08/2017   TSH 1.90 10/25/2016   PSA 0.01 05/31/2017   HGBA1C 6.4 06/08/2017    No results found.  Assessment & Plan:   [name] was seen today for hypertension.  Diagnoses and all orders for this visit:  Prediabetes- His A1c has slightly increased to 6.4%.  He remains prediabetic.  Medical therapy is not indicated.  He agrees to work on his lifestyle modifications. -     Basic metabolic panel; Future -     Hemoglobin A1c; Future  Morbid obesity ([place])- He agrees to work on his lifestyle modifications to lower his weight.  Essential hypertension- His blood pressure is well controlled.  Electrolytes and renal function are normal. -     Basic metabolic panel; Future   I am having Aser R. Silvers maintain his aspirin, Fish Oil, calcium-vitamin D, cyanocobalamin, irbesartan, Vitamin D (Ergocalciferol), atorvastatin, and hydrochlorothiazide.  No orders of the defined types were placed in this encounter.    Follow-up: Return in about 6 months (around 12/06/2017).  Scarlette Calico, MD

## 2017-06-08 NOTE — Patient Instructions (Signed)

## 2017-06-09 ENCOUNTER — Encounter: Payer: Self-pay | Admitting: Internal Medicine

## 2017-06-29 ENCOUNTER — Other Ambulatory Visit: Payer: Self-pay | Admitting: Internal Medicine

## 2017-07-18 ENCOUNTER — Encounter: Payer: Self-pay | Admitting: Internal Medicine

## 2017-07-18 ENCOUNTER — Ambulatory Visit: Payer: Medicare Other | Admitting: Internal Medicine

## 2017-07-18 ENCOUNTER — Ambulatory Visit (INDEPENDENT_AMBULATORY_CARE_PROVIDER_SITE_OTHER): Payer: Medicare Other | Admitting: Internal Medicine

## 2017-07-18 VITALS — BP 136/76 | HR 71 | Temp 98.0°F | Resp 16 | Ht 71.0 in | Wt 239.0 lb

## 2017-07-18 DIAGNOSIS — H6123 Impacted cerumen, bilateral: Secondary | ICD-10-CM

## 2017-07-20 DIAGNOSIS — H6123 Impacted cerumen, bilateral: Secondary | ICD-10-CM | POA: Insufficient documentation

## 2017-07-20 NOTE — Progress Notes (Signed)
Subjective:  Patient ID: Louis Kent, male    DOB: 04/25/1941  Age: 77 y.o. MRN: [id number]  CC: Ear Fullness   HPI Louis Kent presents for concerns about a 1-2 week history of loss of hearing on both sides with muffled sensation in both ears.  He tried to remove earwax with over-the-counter remedies and has not gotten any symptom relief.  Outpatient Medications Prior to Visit  Medication Sig Dispense Refill  . aspirin 81 MG tablet Take 81 mg by mouth daily.      Marland Kitchen atorvastatin (LIPITOR) 10 MG tablet TAKE 1 TABLET (10 MG TOTAL) BY MOUTH DAILY. 90 tablet 1  . calcium-vitamin D (OSCAL WITH D) 500-200 MG-UNIT per tablet Take 1 tablet by mouth daily.      . cyanocobalamin 2000 MCG tablet TAKE 1 TABLET (2,000 MCG TOTAL) BY MOUTH DAILY. 90 tablet 3  . hydrochlorothiazide (MICROZIDE) 12.5 MG capsule TAKE 1 CAPSULE BY MOUTH EVERY DAY 90 capsule 1  . irbesartan (AVAPRO) 300 MG tablet TAKE 1 TABLET (300 MG TOTAL) BY MOUTH DAILY. TAKE 1 TABLET (300 MG TOTAL) BY MOUTH DAILY. 90 tablet 1  . Omega-3 Fatty Acids (FISH OIL) 1000 MG CAPS Take by mouth.      . Vitamin D, Ergocalciferol, (DRISDOL) 50000 units CAPS capsule TAKE 1 CAPSULE BY MOUTH ONCE A WEEK 12 capsule 3   No facility-administered medications prior to visit.     ROS Review of Systems  Constitutional: Negative for diaphoresis and fatigue.  HENT: Positive for hearing loss. Negative for congestion, ear pain, facial swelling, postnasal drip, sinus pressure, sore throat and trouble swallowing.   Eyes: Negative.  Negative for visual disturbance.  Respiratory: Negative for cough, chest tightness, shortness of breath and wheezing.   Cardiovascular: Negative for chest pain, palpitations and leg swelling.  Gastrointestinal: Negative for abdominal pain, constipation, nausea and vomiting.  Endocrine: Negative.   Genitourinary: Negative.   Musculoskeletal: Negative.   Skin: Negative.   Allergic/Immunologic: Negative.   Neurological:  Negative for dizziness, weakness, light-headedness and headaches.  Hematological: Negative for adenopathy. Does not bruise/bleed easily.  Psychiatric/Behavioral: Negative.     Objective:  BP 136/76 (BP Location: Left Arm, Patient Position: Sitting, Cuff Size: Large)   Pulse 71   Temp 98 F (36.7 C) (Oral)   Resp 16   Ht 5\' 11"  (1.803 m)   Wt 239 lb (108.4 kg)   SpO2 96%   BMI 33.33 kg/m   BP Readings from Last 3 Encounters:  07/18/17 136/76  06/08/17 130/60  12/14/16 132/74    Wt Readings from Last 3 Encounters:  07/18/17 239 lb (108.4 kg)  06/08/17 234 lb (106.1 kg)  12/14/16 238 lb (108 kg)    Physical Exam  Constitutional: He is oriented to person, place, and time. No distress.  HENT:  Right Ear: Hearing, tympanic membrane, external ear and ear canal normal.  Left Ear: Hearing, tympanic membrane, external ear and ear canal normal.  Mouth/Throat: Oropharynx is clear and moist. No oropharyngeal exudate.  There was a cerumen impaction in both ears.  I put Colace in both EACs and then I used an ear pick and irrigation device to remove the wax.  It was successfully removed.  The examination afterwards is normal and his hearing is back to normal.  Eyes: Conjunctivae are normal. Left eye exhibits no discharge. No scleral icterus.  Neck: Normal range of motion. Neck supple. No JVD present. No thyromegaly present.  Cardiovascular: Normal rate, regular rhythm and  normal heart sounds. Exam reveals no gallop.  No murmur heard. Pulmonary/Chest: Effort normal and breath sounds normal. No respiratory distress. He has no wheezes. He has no rales.  Abdominal: Soft. Bowel sounds are normal. He exhibits no distension and no mass. There is no guarding.  Musculoskeletal: Normal range of motion. He exhibits no edema, tenderness or deformity.  Lymphadenopathy:    He has no cervical adenopathy.  Neurological: He is alert and oriented to person, place, and time.  Skin: Skin is warm and dry.  No rash noted. He is not diaphoretic. No erythema. No pallor.  Vitals reviewed.   Lab Results  Component Value Date   WBC 7.0 10/25/2016   HGB 14.6 10/25/2016   HCT 43.6 10/25/2016   PLT 190.0 10/25/2016   GLUCOSE 109 (H) 06/08/2017   CHOL 150 10/25/2016   TRIG 105.0 10/25/2016   HDL 62.80 10/25/2016   LDLDIRECT 86.5 10/26/2006   LDLCALC 66 10/25/2016   ALT 29 10/25/2016   AST 21 10/25/2016   NA 136 06/08/2017   K 4.3 06/08/2017   CL 99 06/08/2017   CREATININE 0.94 06/08/2017   BUN 22 06/08/2017   CO2 29 06/08/2017   TSH 1.90 10/25/2016   PSA 0.01 05/31/2017   HGBA1C 6.4 06/08/2017    No results found.  Assessment & Plan:   Louis Kent was seen today for ear fullness.  Diagnoses and all orders for this visit:  Hearing loss of both ears due to cerumen impaction- removal of the cerumen has been successful and his symptoms have resolved.   I am having Louis Kent maintain his aspirin, Fish Oil, calcium-vitamin D, cyanocobalamin, irbesartan, Vitamin D (Ergocalciferol), atorvastatin, and hydrochlorothiazide.  No orders of the defined types were placed in this encounter.    Follow-up: Return if symptoms worsen or fail to improve.  Louis Calico, MD

## 2017-07-20 NOTE — Patient Instructions (Signed)
Earwax Buildup, Adult The ears produce a substance called earwax that helps keep bacteria out of the ear and protects the skin in the ear canal. Occasionally, earwax can build up in the ear and cause discomfort or hearing loss. What increases the risk? This condition is more likely to develop in people who:  Are male.  Are elderly.  Naturally produce more earwax.  Clean their ears often with cotton swabs.  Use earplugs often.  Use in-ear headphones often.  Wear hearing aids.  Have narrow ear canals.  Have earwax that is overly thick or sticky.  Have eczema.  Are dehydrated.  Have excess hair in the ear canal.  What are the signs or symptoms? Symptoms of this condition include:  Reduced or muffled hearing.  A feeling of fullness in the ear or feeling that the ear is plugged.  Fluid coming from the ear.  Ear pain.  Ear itch.  Ringing in the ear.  Coughing.  An obvious piece of earwax that can be seen inside the ear canal.  How is this diagnosed? This condition may be diagnosed based on:  Your symptoms.  Your medical history.  An ear exam. During the exam, your health care provider will look into your ear with an instrument called an otoscope.  You may have tests, including a hearing test. How is this treated? This condition may be treated by:  Using ear drops to soften the earwax.  Having the earwax removed by a health care provider. The health care provider may: ? Flush the ear with water. ? Use an instrument that has a loop on the end (curette). ? Use a suction device.  Surgery to remove the wax buildup. This may be done in severe cases.  Follow these instructions at home:  Take over-the-counter and prescription medicines only as told by your health care provider.  Do not put any objects, including cotton swabs, into your ear. You can clean the opening of your ear canal with a washcloth or facial tissue.  Follow instructions from your health  care provider about cleaning your ears. Do not over-clean your ears.  Drink enough fluid to keep your urine clear or pale yellow. This will help to thin the earwax.  Keep all follow-up visits as told by your health care provider. If earwax builds up in your ears often or if you use hearing aids, consider seeing your health care provider for routine, preventive ear cleanings. Ask your health care provider how often you should schedule your cleanings.  If you have hearing aids, clean them according to instructions from the manufacturer and your health care provider. Contact a health care provider if:  You have ear pain.  You develop a fever.  You have blood, pus, or other fluid coming from your ear.  You have hearing loss.  You have ringing in your ears that does not go away.  Your symptoms do not improve with treatment.  You feel like the room is spinning (vertigo). Summary  Earwax can build up in the ear and cause discomfort or hearing loss.  The most common symptoms of this condition include reduced or muffled hearing and a feeling of fullness in the ear or feeling that the ear is plugged.  This condition may be diagnosed based on your symptoms, your medical history, and an ear exam.  This condition may be treated by using ear drops to soften the earwax or by having the earwax removed by a health care provider.  Do   not put any objects, including cotton swabs, into your ear. You can clean the opening of your ear canal with a washcloth or facial tissue. This information is not intended to replace advice given to you by your health care provider. Make sure you discuss any questions you have with your health care provider. Document Released: 06/09/2004 Document Revised: 07/13/2016 Document Reviewed: 07/13/2016 Elsevier Interactive Patient Education  2018 Elsevier Inc[employer]  

## 2017-08-06 ENCOUNTER — Other Ambulatory Visit: Payer: Self-pay | Admitting: Internal Medicine

## 2017-08-27 ENCOUNTER — Other Ambulatory Visit: Payer: Self-pay | Admitting: Internal Medicine

## 2017-09-05 ENCOUNTER — Encounter: Payer: Self-pay | Admitting: Gastroenterology

## 2017-10-15 ENCOUNTER — Other Ambulatory Visit: Payer: Self-pay | Admitting: Internal Medicine

## 2017-10-15 DIAGNOSIS — I6523 Occlusion and stenosis of bilateral carotid arteries: Secondary | ICD-10-CM

## 2017-10-15 DIAGNOSIS — E785 Hyperlipidemia, unspecified: Secondary | ICD-10-CM

## 2017-12-07 DIAGNOSIS — H2513 Age-related nuclear cataract, bilateral: Secondary | ICD-10-CM | POA: Diagnosis not present

## 2017-12-07 DIAGNOSIS — H524 Presbyopia: Secondary | ICD-10-CM | POA: Diagnosis not present

## 2017-12-07 DIAGNOSIS — H52203 Unspecified astigmatism, bilateral: Secondary | ICD-10-CM | POA: Diagnosis not present

## 2017-12-07 DIAGNOSIS — H25013 Cortical age-related cataract, bilateral: Secondary | ICD-10-CM | POA: Diagnosis not present

## 2017-12-19 ENCOUNTER — Ambulatory Visit: Payer: Medicare Other

## 2017-12-20 ENCOUNTER — Other Ambulatory Visit: Payer: Self-pay | Admitting: Internal Medicine

## 2017-12-26 ENCOUNTER — Ambulatory Visit (INDEPENDENT_AMBULATORY_CARE_PROVIDER_SITE_OTHER): Payer: Medicare Other | Admitting: *Deleted

## 2017-12-26 VITALS — BP 144/82 | HR 75 | Resp 18 | Ht 71.0 in | Wt 239.0 lb

## 2017-12-26 DIAGNOSIS — Z0000 Encounter for general adult medical examination without abnormal findings: Secondary | ICD-10-CM | POA: Diagnosis not present

## 2017-12-26 MED ORDER — ZOSTER VAC RECOMB ADJUVANTED 50 MCG/0.5ML IM SUSR: 0.5000 mL | Freq: Once | INTRAMUSCULAR | 1 refills | Status: AC

## 2017-12-26 NOTE — Progress Notes (Addendum)
Subjective:   Louis Kent is a 77 y.o. male who presents for Medicare Annual/Subsequent preventive examination.  Review of Systems:  No ROS.  Medicare Wellness Visit. Additional risk factors are reflected in the social history.  Cardiac Risk Factors include: advanced age (>20men, >24 women);dyslipidemia;hypertension;male gender Sleep patterns: feels rested on waking, gets up 1 times nightly to void and sleeps 7 hours nightly.    Home Safety/Smoke Alarms: Feels safe in home. Smoke alarms in place.  Living environment; residence and Firearm Safety: [place], Media pl[employer], no firearms. Lives alone, no needs for DME, good support system Seat Belt Safety/Bike Helmet: Wears seat belt.    PSA-  Lab Results  Component Value Date   PSA 0.01 05/31/2017   PSA 0.01 (L) 04/03/2013   PSA 0.01 (L) 03/24/2009       Objective:    Vitals: BP (!) 144/82   Pulse 75   Resp 18   Ht 5\' 11"  (1.803 m)   Wt 239 lb (108.4 kg)   SpO2 99%   BMI 33.33 kg/m   Body mass index is 33.33 kg/m.  Advanced Directives 12/26/2017 12/14/2016  Does Patient Have a Medical Advance Directive? Yes Yes  Type of Paramedic of [place];Living will [place];Living will  Copy of [place] in Chart? - No - copy requested    Tobacco Social History   Tobacco Use  Smoking Status Never Smoker  Smokeless Tobacco Never Used     Counseling given: Not Answered  Past Medical History:  Diagnosis Date  . Hyperlipidemia   . Hypertension   . S/P prostatectomy    Past Surgical History:  Procedure Laterality Date  . ANKLE ARTHROSCOPY W/ OPEN REPAIR    . bilater hip replacements    . PROSTATE SURGERY    . TONSILLECTOMY     Family History  Problem Relation Age of Onset  . Stroke Mother   . Diabetes Mother   . Lung cancer Father        lung cancer  . Lung cancer Unknown    Social History   Socioeconomic History  . Marital status: Widowed   Spouse name: Not on file  . Number of children: 2  . Years of education: Not on file  . Highest education level: Not on file  Occupational History  . Occupation: retired    Fish farm manager: RETIRED  Social Needs  . Financial resource strain: Not hard at all  . Food insecurity:    Worry: Never true    Inability: Never true  . Transportation needs:    Medical: No    Non-medical: No  Tobacco Use  . Smoking status: Never Smoker  . Smokeless tobacco: Never Used  Substance and Sexual Activity  . Alcohol use: No  . Drug use: No  . Sexual activity: Not Currently  Lifestyle  . Physical activity:    Days per week: 3 days    Minutes per session: 40 min  . Stress: Not at all  Relationships  . Social connections:    Talks on phone: More than three times a week    Gets together: More than three times a week    Attends religious service: Not on file    Active member of club or organization: Yes    Attends meetings of clubs or organizations: More than 4 times per year    Relationship status: Widowed  Other Topics Concern  . Not on file  Social History  Narrative  . Not on file    Outpatient Encounter Medications as of 12/26/2017  Medication Sig  . aspirin 81 MG tablet Take 81 mg by mouth daily.    Marland Kitchen atorvastatin (LIPITOR) 10 MG tablet TAKE 1 TABLET (10 MG TOTAL) BY MOUTH DAILY.  . calcium-vitamin D (OSCAL WITH D) 500-200 MG-UNIT per tablet Take 1 tablet by mouth daily.    . cyanocobalamin 2000 MCG tablet TAKE 1 TABLET (2,000 MCG TOTAL) BY MOUTH DAILY.  . hydrochlorothiazide (MICROZIDE) 12.5 MG capsule TAKE 1 CAPSULE BY MOUTH EVERY DAY  . irbesartan (AVAPRO) 300 MG tablet TAKE 1 TABLET (300 MG TOTAL) BY MOUTH DAILY. TAKE 1 TABLET (300 MG TOTAL) BY MOUTH DAILY.  Marland Kitchen Omega-3 Fatty Acids (FISH OIL) 1000 MG CAPS Take by mouth.    . Vitamin D, Ergocalciferol, (DRISDOL) 50000 units CAPS capsule TAKE 1 CAPSULE BY MOUTH ONCE A WEEK  . Zoster Vaccine Adjuvanted [facility]) injection Inject 0.5 mLs into  the muscle once for 1 dose.  . [DISCONTINUED] cyanocobalamin 2000 MCG tablet TAKE 1 TABLET BY MOUTH EVERY DAY (Patient not taking: Reported on 12/26/2017)   No facility-administered encounter medications on file as of 12/26/2017.     Activities of Daily Living In your present state of health, do you have any difficulty performing the following activities: 12/26/2017  Hearing? Y  Vision? N  Difficulty concentrating or making decisions? N  Walking or climbing stairs? N  Dressing or bathing? N  Doing errands, shopping? N  Preparing Food and eating ? N  Using the Toilet? N  In the past six months, have you accidently leaked urine? N  Do you have problems with loss of bowel control? N  Managing your Medications? N  Managing your Finances? N  Housekeeping or managing your Housekeeping? N  Some recent data might be hidden    Patient Care Team: [name], MD as PCP - General (Internal Medicine)   Assessment:   This is a routine wellness examination for Louis Kent. Physical assessment deferred to PCP.   Exercise Activities and Dietary recommendations Current Exercise Habits: Home exercise routine, Type of exercise: walking, Time (Minutes): 30, Frequency (Times/Week): 3, Weekly Exercise (Minutes/Week): 90, Intensity: Mild, Exercise limited by: None identified  Diet (meal preparation, eat out, water intake, caffeinated beverages, dairy products, fruits and vegetables): in general, a "healthy" diet  , well balanced   Reviewed heart healthy and diabetic diet. Encouraged patient to increase daily water and healthy fluid intake.  Goals    . lose weight     Eat healthy, portion control, and exercise.    . Patient Stated     Increase my physical activity and eat healthier by walking more often, get up and moving around the office when I am working. Enjoy life and family.       Fall Risk Fall Risk  12/26/2017 12/14/2016 10/25/2016 09/17/2015 04/28/2014  Falls in the past year? No No No No No     Depression Screen PHQ 2/9 Scores 12/26/2017 12/14/2016 10/25/2016 09/17/2015  PHQ - 2 Score 0 0 0 0  PHQ- 9 Score - 0 - -    Cognitive Function       Ad8 score reviewed for issues:  Issues making decisions: no  Less interest in hobbies / activities: no  Repeats questions, stories (family complaining): no  Trouble using ordinary gadgets (microwave, computer, phone):no  Forgets the month or year: no  Mismanaging finances: no  Remembering appts: no  Daily problems with thinking  and/or memory: no Ad8 score is= 0  Immunization History  Administered Date(s) Administered  . Influenza Split 02/17/2011, 03/22/2012  . Influenza Whole 05/16/2004, 03/16/2007, 01/30/2008, 03/10/2009, 02/04/2010  . Influenza, High Dose Seasonal PF 03/05/2013, 04/08/2015, 03/03/2016, 02/28/2017  . Influenza,inj,Quad PF,6+ Mos 03/14/2014  . Pneumococcal Conjugate-13 12/12/2013  . Pneumococcal Polysaccharide-23 05/16/2004, 09/02/2014  . Td 05/16/2002  . Tdap 09/13/2012  . Zoster 02/04/2010   Screening Tests Health Maintenance  Topic Date Due  . INFLUENZA VACCINE  12/14/2017  . TETANUS/TDAP  09/14/2022  . PNA vac Low Risk Adult  Completed      Plan:     Continue doing brain stimulating activities (puzzles, reading, adult coloring books, staying active) to keep memory sharp.   Continue to eat heart healthy diet (full of fruits, vegetables, whole grains, lean protein, water--limit salt, fat, and sugar intake) and increase physical activity as tolerated.  I have personally reviewed and noted the following in the patient's chart:   . Medical and social history . Use of alcohol, tobacco or illicit drugs  . Current medications and supplements . Functional ability and status . Nutritional status . Physical activity . Advanced directives . List of other physicians . Vitals . Screenings to include cognitive, depression, and falls . Referrals and appointments  In addition, I have reviewed  and discussed with patient certain preventive protocols, quality metrics, and best practice recommendations. A written personalized care plan for preventive services as well as general preventive health recommendations were provided to patient.   Medical screening examination/treatment/procedure(s) were performed by non-physician practitioner and as supervising physician I was immediately available for consultation/collaboration. I agree with above. Scarlette Calico, MD   Michiel Cowboy, RN  12/26/2017

## 2017-12-26 NOTE — Patient Instructions (Addendum)
Continue doing brain stimulating activities (puzzles, reading, adult coloring books, staying active) to keep memory sharp.   Continue to eat heart healthy diet (full of fruits, vegetables, whole grains, lean protein, water--limit salt, fat, and sugar intake) and increase physical activity as tolerated.  Louis Kent , Thank you for taking time to come for your Medicare Wellness Visit. I appreciate your ongoing commitment to your health goals. Please review the following plan we discussed and let me know if I can assist you in the future.   These are the goals we discussed: Goals    . lose weight     Eat healthy, portion control, and exercise.    . Patient Stated     Increase my physical activity and eat healthier by walking more often, get up and moving around the office when I am working. Enjoy life and family.       This is a list of the screening recommended for you and due dates:  Health Maintenance  Topic Date Due  . Flu Shot  12/14/2017  . Tetanus Vaccine  09/14/2022  . Pneumonia vaccines  Completed

## 2017-12-27 ENCOUNTER — Other Ambulatory Visit: Payer: Self-pay | Admitting: Internal Medicine

## 2018-02-01 ENCOUNTER — Ambulatory Visit (INDEPENDENT_AMBULATORY_CARE_PROVIDER_SITE_OTHER): Payer: Medicare Other

## 2018-02-01 DIAGNOSIS — Z23 Encounter for immunization: Secondary | ICD-10-CM

## 2018-03-27 ENCOUNTER — Other Ambulatory Visit: Payer: Self-pay | Admitting: Internal Medicine

## 2018-04-10 ENCOUNTER — Ambulatory Visit (INDEPENDENT_AMBULATORY_CARE_PROVIDER_SITE_OTHER): Payer: Medicare Other | Admitting: Family

## 2018-04-10 ENCOUNTER — Encounter: Payer: Self-pay | Admitting: Family

## 2018-04-10 VITALS — BP 140/72 | HR 80 | Temp 98.1°F | Ht 71.0 in | Wt 239.0 lb

## 2018-04-10 DIAGNOSIS — H9193 Unspecified hearing loss, bilateral: Secondary | ICD-10-CM

## 2018-04-10 DIAGNOSIS — H6123 Impacted cerumen, bilateral: Secondary | ICD-10-CM | POA: Diagnosis not present

## 2018-04-10 DIAGNOSIS — H6983 Other specified disorders of Eustachian tube, bilateral: Secondary | ICD-10-CM | POA: Diagnosis not present

## 2018-04-10 MED ORDER — FLUTICASONE PROPIONATE 50 MCG/ACT NA SUSP: 2.0000 | Freq: Every day | NASAL | 6 refills | Status: DC

## 2018-04-10 NOTE — Progress Notes (Signed)
Louis Kent is a 77 y.o. male with the following history as recorded in EpicCare:  Patient Active Problem List   Diagnosis Date Noted  . Hearing loss of both ears due to cerumen impaction 07/20/2017  . Vitamin D deficiency 09/17/2015  . Carotid atherosclerosis 01/05/2015  . Hyperlipidemia with target LDL less than 130 09/03/2014  . Symptomatic cholelithiasis 12/30/2013  . Ventral hernia 12/30/2013  . Prediabetes 12/12/2013  . B12 nutritional deficiency 09/16/2009  . Morbid obesity ([place]) 04/01/2009  . Disorder of bone and cartilage 06/06/2007  . Pure hyperglyceridemia 02/07/2007  . Essential hypertension 02/07/2007    Current Outpatient Medications  Medication Sig Dispense Refill  . aspirin 81 MG tablet Take 81 mg by mouth daily.      Marland Kitchen atorvastatin (LIPITOR) 10 MG tablet TAKE 1 TABLET (10 MG TOTAL) BY MOUTH DAILY. 90 tablet 1  . calcium-vitamin D (OSCAL WITH D) 500-200 MG-UNIT per tablet Take 1 tablet by mouth daily.      . [employer] VITAMIN B-12 2000 MCG TBCR TAKE 1 TABLET BY MOUTH EVERY DAY 90 tablet 1  . hydrochlorothiazide (MICROZIDE) 12.5 MG capsule TAKE 1 CAPSULE BY MOUTH EVERY DAY 90 capsule 1  . irbesartan (AVAPRO) 300 MG tablet TAKE 1 TABLET (300 MG TOTAL) BY MOUTH DAILY. TAKE 1 TABLET (300 MG TOTAL) BY MOUTH DAILY. 90 tablet 1  . Multiple Vitamins-Minerals (MULTIVITAMIN WITH MINERALS) tablet Take by mouth.    . omega-3 acid ethyl esters (LOVAZA) 1 g capsule Take by mouth.    . Omega-3 Fatty Acids (FISH OIL) 1000 MG CAPS Take by mouth.      . Vitamin D, Ergocalciferol, (DRISDOL) 50000 units CAPS capsule TAKE 1 CAPSULE BY MOUTH ONCE A WEEK 12 capsule 1  . cyanocobalamin 2000 MCG tablet TAKE 1 TABLET (2,000 MCG TOTAL) BY MOUTH DAILY. (Patient not taking: Reported on 04/10/2018) 90 tablet 3  . fluticasone (FLONASE) 50 MCG/ACT nasal spray Place 2 sprays into both nostrils daily. 16 g 6   No current facility-administered medications for this visit.     Allergies: Patient has no  known allergies.  Past Medical History:  Diagnosis Date  . Hyperlipidemia   . Hypertension   . S/P prostatectomy     Past Surgical History:  Procedure Laterality Date  . ANKLE ARTHROSCOPY W/ OPEN REPAIR    . bilater hip replacements    . PROSTATE SURGERY    . TONSILLECTOMY      Family History  Problem Relation Age of Onset  . Stroke Mother   . Diabetes Mother   . Lung cancer Father        lung cancer  . Lung cancer Unknown     Social History   Tobacco Use  . Smoking status: Never Smoker  . Smokeless tobacco: Never Used  Substance Use Topics  . Alcohol use: No    Subjective:  Patient presents with concerns that hearing in his right ear is down; is suspicious that may have ear wax in his ears; has tried using OTC meds with limited benefit; has needed ear lavage in the past;    Objective:  Vitals:   04/10/18 0904  BP: 140/72  Pulse: 80  Temp: 98.1 F (36.7 C)  TempSrc: Oral  SpO2: 98%  Weight: 239 lb (108.4 kg)  Height: 5\' 11"  (1.803 m)    General: Well developed, well nourished, in no acute distress  Skin : Warm and dry.  Head: Normocephalic and atraumatic  Eyes: Sclera and conjunctiva clear;  pupils round and reactive to light; extraocular movements intact  Ears: External normal; after lavage, canals clear; tympanic membranes normal  Oropharynx: Pink, supple. No suspicious lesions  Neck: Supple without thyromegaly, adenopathy  Lungs: Respirations unlabored; clear to auscultation bilaterally without wheeze, rales, rhonchi  CVS exam: normal rate and regular rhythm.  Neurologic: Alert and oriented; speech intact; face symmetrical; moves all extremities well; CNII-XII intact without focal deficit   Assessment:  1. Decreased hearing of both ears   2. Bilateral impacted cerumen   3. Eustachian tube dysfunction, bilateral     Plan:  Ear lavage completed in office with no complications; trial of Flonase NS; if symptoms persist, would recommend ENT evaluation.  Follow-up as needed.  No follow-ups on file.  No orders of the defined types were placed in this encounter.   Requested Prescriptions   Signed Prescriptions Disp Refills  . fluticasone (FLONASE) 50 MCG/ACT nasal spray 16 g 6    Sig: Place 2 sprays into both nostrils daily.

## 2018-04-10 NOTE — Patient Instructions (Signed)
Eustachian Tube Dysfunction The eustachian tube connects the middle ear to the back of the nose. It regulates air pressure in the middle ear by allowing air to move between the ear and nose. It also helps to drain fluid from the middle ear space. When the eustachian tube does not function properly, air pressure, fluid, or both can build up in the middle ear. Eustachian tube dysfunction can affect one or both ears. What are the causes? This condition happens when the eustachian tube becomes blocked or cannot open normally. This may result from:  Ear infections.  Colds and other upper respiratory infections.  Allergies.  Irritation, such as from cigarette smoke or acid from the stomach coming up into the esophagus (gastroesophageal reflux).  Sudden changes in air pressure, such as from descending in an airplane.  Abnormal growths in the nose or throat, such as nasal polyps, tumors, or enlarged tissue at the back of the throat (adenoids).  What increases the risk? This condition may be more likely to develop in people who smoke and people who are overweight. Eustachian tube dysfunction may also be more likely to develop in children, especially children who have:  Certain birth defects of the mouth, such as cleft palate.  Large tonsils and adenoids.  What are the signs or symptoms? Symptoms of this condition may include:  A feeling of fullness in the ear.  Ear pain.  Clicking or popping noises in the ear.  Ringing in the ear.  Hearing loss.  Loss of balance.  Symptoms may get worse when the air pressure around you changes, such as when you travel to an area of high elevation or fly on an airplane. How is this diagnosed? This condition may be diagnosed based on:  Your symptoms.  A physical exam of your ear, nose, and throat.  Tests, such as those that measure: ? The movement of your eardrum (tympanogram). ? Your hearing (audiometry).  How is this treated? Treatment  depends on the cause and severity of your condition. If your symptoms are mild, you may be able to relieve your symptoms by moving air into ("popping") your ears. If you have symptoms of fluid in your ears, treatment may include:  Decongestants.  Antihistamines.  Nasal sprays or ear drops that contain medicines that reduce swelling (steroids).  In some cases, you may need to have a procedure to drain the fluid in your eardrum (myringotomy). In this procedure, a small tube is placed in the eardrum to:  Drain the fluid.  Restore the air in the middle ear space.  Follow these instructions at home:  Take over-the-counter and prescription medicines only as told by your health care provider.  Use techniques to help pop your ears as recommended by your health care provider. These may include: ? Chewing gum. ? Yawning. ? Frequent, forceful swallowing. ? Closing your mouth, holding your nose closed, and gently blowing as if you are trying to blow air out of your nose.  Do not do any of the following until your health care provider approves: ? Travel to high altitudes. ? Fly in airplanes. ? Work in a pressurized cabin or room. ? Scuba dive.  Keep your ears dry. Dry your ears completely after showering or bathing.  Do not smoke.  Keep all follow-up visits as told by your health care provider. This is important. Contact a health care provider if:  Your symptoms do not go away after treatment.  Your symptoms come back after treatment.  You are   unable to pop your ears.  You have: ? A fever. ? Pain in your ear. ? Pain in your head or neck. ? Fluid draining from your ear.  Your hearing suddenly changes.  You become very dizzy.  You lose your balance. This information is not intended to replace advice given to you by your health care provider. Make sure you discuss any questions you have with your health care provider. Document Released: 05/29/2015 Document Revised: 10/08/2015  Document Reviewed: 05/21/2014 Elsevier Interactive Patient Education  2018 [employer]  

## 2018-04-14 ENCOUNTER — Other Ambulatory Visit: Payer: Self-pay | Admitting: Internal Medicine

## 2018-04-14 DIAGNOSIS — I6523 Occlusion and stenosis of bilateral carotid arteries: Secondary | ICD-10-CM

## 2018-04-14 DIAGNOSIS — E785 Hyperlipidemia, unspecified: Secondary | ICD-10-CM

## 2018-05-04 ENCOUNTER — Other Ambulatory Visit: Payer: Self-pay | Admitting: Internal Medicine

## 2018-05-04 DIAGNOSIS — E785 Hyperlipidemia, unspecified: Secondary | ICD-10-CM

## 2018-05-04 DIAGNOSIS — I6523 Occlusion and stenosis of bilateral carotid arteries: Secondary | ICD-10-CM

## 2018-05-30 ENCOUNTER — Telehealth: Payer: Self-pay | Admitting: Internal Medicine

## 2018-05-30 DIAGNOSIS — I6523 Occlusion and stenosis of bilateral carotid arteries: Secondary | ICD-10-CM

## 2018-05-30 DIAGNOSIS — E785 Hyperlipidemia, unspecified: Secondary | ICD-10-CM

## 2018-06-01 MED ORDER — ATORVASTATIN CALCIUM 10 MG PO TABS: 10.0000 mg | ORAL_TABLET | Freq: Every day | ORAL | 0 refills | Status: DC

## 2018-06-01 MED ORDER — IRBESARTAN 300 MG PO TABS: 300.0000 mg | ORAL_TABLET | Freq: Every day | ORAL | 0 refills | Status: DC

## 2018-06-01 NOTE — Addendum Note (Signed)
Addended by: Earnstine Regal on: 06/01/2018 11:10 AM   Modules accepted: Orders

## 2018-06-01 NOTE — Telephone Encounter (Signed)
Per office policy sent 30 day to local pharmacy until appt.../lmb  

## 2018-06-01 NOTE — Telephone Encounter (Signed)
Pt called in and made Dr Ronnald Ramp 1st available appt, for his refills but he will be out this weekend.  He has an appt 2/10.  He would like to know if a small refill can be called in to make it to his appt.    [id number] - [place], [place] [phone] (Phone)

## 2018-06-02 ENCOUNTER — Other Ambulatory Visit: Payer: Self-pay | Admitting: Internal Medicine

## 2018-06-14 ENCOUNTER — Other Ambulatory Visit: Payer: Self-pay | Admitting: Internal Medicine

## 2018-06-22 ENCOUNTER — Encounter: Payer: Self-pay | Admitting: Family

## 2018-06-22 ENCOUNTER — Ambulatory Visit (INDEPENDENT_AMBULATORY_CARE_PROVIDER_SITE_OTHER): Payer: Medicare Other | Admitting: Family

## 2018-06-22 VITALS — BP 136/84 | HR 78 | Temp 98.1°F | Ht 71.0 in | Wt 242.0 lb

## 2018-06-22 DIAGNOSIS — H6123 Impacted cerumen, bilateral: Secondary | ICD-10-CM | POA: Diagnosis not present

## 2018-06-22 NOTE — Progress Notes (Signed)
Patient consent obtained. Irrigation with water and peroxide performed on both ears today. Full view of tympanic membranes after procedure.  Patient tolerated procedure well.

## 2018-06-22 NOTE — Progress Notes (Signed)
Louis Kent is a 78 y.o. male with the following history as recorded in Epic[employer]:  Patient Active Problem List   Diagnosis Date Noted  . Hearing loss of both ears due to cerumen impaction 07/20/2017  . Vitamin D deficiency 09/17/2015  . Carotid atherosclerosis 01/05/2015  . Hyperlipidemia with target LDL less than 130 09/03/2014  . Symptomatic cholelithiasis 12/30/2013  . Ventral hernia 12/30/2013  . Prediabetes 12/12/2013  . B12 nutritional deficiency 09/16/2009  . Morbid obesity ([place]) 04/01/2009  . Disorder of bone and cartilage 06/06/2007  . Pure hyperglyceridemia 02/07/2007  . Essential hypertension 02/07/2007    Current Outpatient Medications  Medication Sig Dispense Refill  . aspirin 81 MG tablet Take 81 mg by mouth daily.      Marland Kitchen atorvastatin (LIPITOR) 10 MG tablet Take 1 tablet (10 mg total) by mouth daily. Must keep schedule appt for future refills 30 tablet 0  . calcium-vitamin D (OSCAL WITH D) 500-200 MG-UNIT per tablet Take 1 tablet by mouth daily.      . [facility] VITAMIN B-12 2000 MCG TBCR TAKE 1 TABLET BY MOUTH EVERY DAY 90 tablet 1  . hydrochlorothiazide (MICROZIDE) 12.5 MG capsule TAKE 1 CAPSULE BY MOUTH EVERY DAY 90 capsule 1  . irbesartan (AVAPRO) 300 MG tablet Take 1 tablet (300 mg total) by mouth daily. Must keep schedule appt for future refills 30 tablet 0  . Multiple Vitamins-Minerals (MULTIVITAMIN WITH MINERALS) tablet Take by mouth.    . omega-3 acid ethyl esters (LOVAZA) 1 g capsule Take by mouth.    . Omega-3 Fatty Acids (FISH OIL) 1000 MG CAPS Take by mouth.      . Vitamin D, Ergocalciferol, (DRISDOL) 1.25 MG (50000 UT) CAPS capsule TAKE 1 CAPSULE BY MOUTH ONCE A WEEK 12 capsule 1  . cyanocobalamin 2000 MCG tablet TAKE 1 TABLET (2,000 MCG TOTAL) BY MOUTH DAILY. (Patient not taking: Reported on 04/10/2018) 90 tablet 3  . fluticasone (FLONASE) 50 MCG/ACT nasal spray Place 2 sprays into both nostrils daily. (Patient not taking: Reported on 06/22/2018) 16 g 6   No  current facility-administered medications for this visit.     Allergies: Patient has no known allergies.  Past Medical History:  Diagnosis Date  . Hyperlipidemia   . Hypertension   . S/P prostatectomy     Past Surgical History:  Procedure Laterality Date  . ANKLE ARTHROSCOPY W/ OPEN REPAIR    . bilater hip replacements    . PROSTATE SURGERY    . TONSILLECTOMY      Family History  Problem Relation Age of Onset  . Stroke Mother   . Diabetes Mother   . Lung cancer Father        lung cancer  . Lung cancer Unknown     Social History   Tobacco Use  . Smoking status: Never Smoker  . Smokeless tobacco: Never Used  Substance Use Topics  . Alcohol use: No    Subjective:  Patient is requesting to have ear wax cleaned out of his ears today; notes that his hearing is down; scheduled to have hearing aids fitted tomorrow- wants to be sure that ear canals are clear;    Objective:  Vitals:   06/22/18 0901  BP: 136/84  Pulse: 78  Temp: 98.1 F (36.7 C)  TempSrc: Oral  SpO2: 97%  Weight: 242 lb (109.8 kg)  Height: 5\' 11"  (1.803 m)    General: Well developed, well nourished, in no acute distress  Skin : Warm and dry.  Head: Normocephalic and atraumatic  Eyes: Sclera and conjunctiva clear; pupils round and reactive to light; extraocular movements intact  Ears:  both ears canal impacted with copious hard wax, examination post ear lavage canal is clear and no bleeding or complications noted. Oropharynx: Pink, supple. No suspicious lesions  Neck: Supple without thyromegaly, adenopathy  Lungs: Respirations unlabored;  Neurologic: Alert and oriented; speech intact; face symmetrical; moves all extremities well; CNII-XII intact without focal deficit   Assessment:  1. Bilateral impacted cerumen     Plan:  Ear lavage completed with no difficulty; patient will plan to have hearing aids fitted tomorrow;  Keep planned follow-up with his PCP for Monday;  No follow-ups on file.  No  orders of the defined types were placed in this encounter.   Requested Prescriptions    No prescriptions requested or ordered in this encounter

## 2018-06-23 ENCOUNTER — Other Ambulatory Visit: Payer: Self-pay | Admitting: Internal Medicine

## 2018-06-23 DIAGNOSIS — E785 Hyperlipidemia, unspecified: Secondary | ICD-10-CM

## 2018-06-23 DIAGNOSIS — I6523 Occlusion and stenosis of bilateral carotid arteries: Secondary | ICD-10-CM

## 2018-06-25 ENCOUNTER — Encounter: Payer: Self-pay | Admitting: Internal Medicine

## 2018-06-25 ENCOUNTER — Ambulatory Visit (INDEPENDENT_AMBULATORY_CARE_PROVIDER_SITE_OTHER): Payer: Medicare Other | Admitting: Internal Medicine

## 2018-06-25 ENCOUNTER — Other Ambulatory Visit (INDEPENDENT_AMBULATORY_CARE_PROVIDER_SITE_OTHER): Payer: Medicare Other

## 2018-06-25 VITALS — BP 164/94 | HR 76 | Temp 97.8°F | Resp 16 | Ht 71.0 in | Wt 240.0 lb

## 2018-06-25 DIAGNOSIS — R7303 Prediabetes: Secondary | ICD-10-CM | POA: Diagnosis not present

## 2018-06-25 DIAGNOSIS — E538 Deficiency of other specified B group vitamins: Secondary | ICD-10-CM | POA: Diagnosis not present

## 2018-06-25 DIAGNOSIS — E559 Vitamin D deficiency, unspecified: Secondary | ICD-10-CM

## 2018-06-25 DIAGNOSIS — E785 Hyperlipidemia, unspecified: Secondary | ICD-10-CM

## 2018-06-25 DIAGNOSIS — I10 Essential (primary) hypertension: Secondary | ICD-10-CM | POA: Diagnosis not present

## 2018-06-25 DIAGNOSIS — H906 Mixed conductive and sensorineural hearing loss, bilateral: Secondary | ICD-10-CM

## 2018-06-25 DIAGNOSIS — I6523 Occlusion and stenosis of bilateral carotid arteries: Secondary | ICD-10-CM

## 2018-06-25 DIAGNOSIS — E781 Pure hyperglyceridemia: Secondary | ICD-10-CM

## 2018-06-25 DIAGNOSIS — Z0000 Encounter for general adult medical examination without abnormal findings: Secondary | ICD-10-CM

## 2018-06-25 LAB — CBC WITH DIFFERENTIAL/PLATELET
Basophils Absolute: 0.1 10*3/uL (ref 0.0–0.1)
Basophils Relative: 0.9 % (ref 0.0–3.0)
Eosinophils Absolute: 0.3 10*3/uL (ref 0.0–0.7)
Eosinophils Relative: 3.9 % (ref 0.0–5.0)
HCT: 43.5 % (ref 39.0–52.0)
Hemoglobin: 14.6 g/dL (ref 13.0–17.0)
Lymphocytes Relative: 25.7 % (ref 12.0–46.0)
Lymphs Abs: 1.9 10*3/uL (ref 0.7–4.0)
MCHC: 33.7 g/dL (ref 30.0–36.0)
MCV: 92.8 fl (ref 78.0–100.0)
Monocytes Absolute: 0.6 10*3/uL (ref 0.1–1.0)
Monocytes Relative: 8.4 % (ref 3.0–12.0)
Neutro Abs: 4.6 10*3/uL (ref 1.4–7.7)
Neutrophils Relative %: 61.1 % (ref 43.0–77.0)
Platelets: 201 10*3/uL (ref 150.0–400.0)
RBC: 4.69 Mil/uL (ref 4.22–5.81)
RDW: 13.6 % (ref 11.5–15.5)
WBC: 7.5 10*3/uL (ref 4.0–10.5)

## 2018-06-25 LAB — URINALYSIS, ROUTINE W REFLEX MICROSCOPIC
Bilirubin Urine: NEGATIVE
Hgb urine dipstick: NEGATIVE
Ketones, ur: NEGATIVE
Leukocytes, UA: NEGATIVE
Nitrite: NEGATIVE
Specific Gravity, Urine: <=1.005 — AB (ref 1.000–1.030)
Total Protein, Urine: NEGATIVE
Urine Glucose: NEGATIVE
Urobilinogen, UA: 0.2 (ref 0.0–1.0)
pH: 6.5 (ref 5.0–8.0)

## 2018-06-25 LAB — LIPID PANEL
Cholesterol: 143 mg/dL (ref 0–200)
HDL: 67 mg/dL (ref >39.00)
LDL Cholesterol: 48 mg/dL (ref 0–99)
NonHDL: 75.69
Total CHOL/HDL Ratio: 2
Triglycerides: 136 mg/dL (ref 0.0–149.0)
VLDL: 27.2 mg/dL (ref 0.0–40.0)

## 2018-06-25 LAB — TSH: TSH: 2.69 u[IU]/mL (ref 0.35–4.50)

## 2018-06-25 LAB — VITAMIN D 25 HYDROXY (VIT D DEFICIENCY, FRACTURES): VITD: 72.21 ng/mL (ref 30.00–100.00)

## 2018-06-25 LAB — COMPREHENSIVE METABOLIC PANEL
ALT: 29 U/L (ref 0–53)
AST: 25 U/L (ref 0–37)
Albumin: 4.4 g/dL (ref 3.5–5.2)
Alkaline Phosphatase: 81 U/L (ref 39–117)
BUN: 14 mg/dL (ref 6–23)
CO2: 28 mEq/L (ref 19–32)
Calcium: 9.6 mg/dL (ref 8.4–10.5)
Chloride: 101 mEq/L (ref 96–112)
Creatinine, Ser: 0.93 mg/dL (ref 0.40–1.50)
GFR: 78.22 mL/min (ref >60.00)
Glucose, Bld: 85 mg/dL (ref 70–99)
Potassium: 4.1 mEq/L (ref 3.5–5.1)
Sodium: 139 mEq/L (ref 135–145)
Total Bilirubin: 0.7 mg/dL (ref 0.2–1.2)
Total Protein: 6.9 g/dL (ref 6.0–8.3)

## 2018-06-25 LAB — HEMOGLOBIN A1C: Hgb A1c MFr Bld: 6.2 % (ref 4.6–6.5)

## 2018-06-25 MED ORDER — HYDROCHLOROTHIAZIDE 25 MG PO TABS: 25.0000 mg | ORAL_TABLET | Freq: Every day | ORAL | 1 refills | Status: DC

## 2018-06-25 MED ORDER — IRBESARTAN 300 MG PO TABS: 300.0000 mg | ORAL_TABLET | Freq: Every day | ORAL | 1 refills | Status: DC

## 2018-06-25 NOTE — Patient Instructions (Signed)

## 2018-06-25 NOTE — Progress Notes (Signed)
Subjective:  Patient ID: Louis Kent, male    DOB: 22-Aug-1940  Age: 78 y.o. MRN: [id number]  CC: Annual Exam; Hypertension; and Hyperlipidemia   HPI DEQUAVIOUS Kent presents for a CPX.  He recently had his hearing tested and was told that he has asymmetrical hearing loss, worse on the right than the left.  He wants to see an ENT doctor.  He walks a lot and though he does not check his blood pressure he denies any recent episodes of headache/blurred vision/CP/SOB/or fatigue.  He has asymptomatic lower extremity edema.  Past Medical History:  Diagnosis Date  . Hyperlipidemia   . Hypertension   . S/P prostatectomy    Past Surgical History:  Procedure Laterality Date  . ANKLE ARTHROSCOPY W/ OPEN REPAIR    . bilater hip replacements    . PROSTATE SURGERY    . TONSILLECTOMY      reports that he has never smoked. He has never used smokeless tobacco. He reports that he does not drink alcohol or use drugs. family history includes Diabetes in his mother; Lung cancer in his father and unknown relative; Stroke in his mother. No Known Allergies  Outpatient Medications Prior to Visit  Medication Sig Dispense Refill  . aspirin 81 MG tablet Take 81 mg by mouth daily.      Marland Kitchen atorvastatin (LIPITOR) 10 MG tablet Take 1 tablet (10 mg total) by mouth daily. Must keep schedule appt for future refills 30 tablet 0  . calcium-vitamin D (OSCAL WITH D) 500-200 MG-UNIT per tablet Take 1 tablet by mouth daily.      . [facility] VITAMIN B-12 2000 MCG TBCR TAKE 1 TABLET BY MOUTH EVERY DAY 90 tablet 1  . cyanocobalamin 2000 MCG tablet TAKE 1 TABLET (2,000 MCG TOTAL) BY MOUTH DAILY. 90 tablet 3  . Multiple Vitamins-Minerals (MULTIVITAMIN WITH MINERALS) tablet Take by mouth.    . omega-3 acid ethyl esters (LOVAZA) 1 g capsule Take by mouth.    . Vitamin D, Ergocalciferol, (DRISDOL) 1.25 MG (50000 UT) CAPS capsule TAKE 1 CAPSULE BY MOUTH ONCE A WEEK 12 capsule 1  . fluticasone (FLONASE) 50 MCG/ACT nasal spray  Place 2 sprays into both nostrils daily. 16 g 6  . hydrochlorothiazide (MICROZIDE) 12.5 MG capsule TAKE 1 CAPSULE BY MOUTH EVERY DAY 90 capsule 1  . irbesartan (AVAPRO) 300 MG tablet Take 1 tablet (300 mg total) by mouth daily. Must keep schedule appt for future refills 30 tablet 0  . Omega-3 Fatty Acids (FISH OIL) 1000 MG CAPS Take by mouth.       No facility-administered medications prior to visit.     ROS Review of Systems  Constitutional: Negative for diaphoresis, fatigue and unexpected weight change.  HENT: Negative.   Eyes: Negative for visual disturbance.  Respiratory: Negative for cough, chest tightness, shortness of breath and wheezing.   Cardiovascular: Positive for leg swelling. Negative for chest pain and palpitations.  Gastrointestinal: Negative for abdominal pain, constipation, diarrhea, nausea and vomiting.  Endocrine: Negative.   Genitourinary: Negative.  Negative for difficulty urinating, frequency, penile swelling, scrotal swelling, testicular pain and urgency.  Musculoskeletal: Negative.  Negative for arthralgias and myalgias.  Skin: Negative.  Negative for color change, pallor and rash.  Neurological: Negative for dizziness, weakness, light-headedness and headaches.  Hematological: Negative for adenopathy. Does not bruise/bleed easily.  Psychiatric/Behavioral: Negative.     Objective:  BP (!) 164/94   Pulse 76   Temp 97.8 F (36.6 C) (Oral)   Resp  16   Ht 5\' 11"  (1.803 m)   Wt 240 lb (108.9 kg)   SpO2 98%   BMI 33.47 kg/m   BP Readings from Last 3 Encounters:  06/25/18 (!) 164/94  06/22/18 136/84  04/10/18 140/72    Wt Readings from Last 3 Encounters:  06/25/18 240 lb (108.9 kg)  06/22/18 242 lb (109.8 kg)  04/10/18 239 lb (108.4 kg)    Physical Exam Vitals signs reviewed.  Constitutional:      Appearance: He is obese. He is not ill-appearing.  HENT:     Nose: Nose normal. No congestion or rhinorrhea.     Mouth/Throat:     Mouth: Mucous  membranes are moist.     Pharynx: Oropharynx is clear. No oropharyngeal exudate or posterior oropharyngeal erythema.  Eyes:     General: No scleral icterus.    Conjunctiva/sclera: Conjunctivae normal.  Neck:     Musculoskeletal: Normal range of motion and neck supple. No neck rigidity.  Cardiovascular:     Rate and Rhythm: Normal rate and regular rhythm.     Heart sounds: Normal heart sounds. No murmur. No gallop.      Comments: EKG ---  Sinus  Rhythm  WITHIN NORMAL LIMITS  Pulmonary:     Effort: No respiratory distress.     Breath sounds: No stridor. No wheezing, rhonchi or rales.  Abdominal:     General: Abdomen is flat. There is no distension.     Palpations: There is no mass.     Tenderness: There is no abdominal tenderness. There is no guarding.  Musculoskeletal: Normal range of motion.        General: No swelling.     Right lower leg: Edema (1+ pitting) present.     Left lower leg: Edema (1+ pitting) present.  Lymphadenopathy:     Cervical: No cervical adenopathy.  Skin:    General: Skin is warm and dry.     Coloration: Skin is not pale.  Neurological:     General: No focal deficit present.     Mental Status: He is oriented to person, place, and time. Mental status is at baseline.  Psychiatric:        Mood and Affect: Mood normal.        Behavior: Behavior normal.        Thought Content: Thought content normal.        Judgment: Judgment normal.     Lab Results  Component Value Date   WBC 7.5 06/25/2018   HGB 14.6 06/25/2018   HCT 43.5 06/25/2018   PLT 201.0 06/25/2018   GLUCOSE 85 06/25/2018   CHOL 143 06/25/2018   TRIG 136.0 06/25/2018   HDL 67.00 06/25/2018   LDLDIRECT 86.5 10/26/2006   LDLCALC 48 06/25/2018   ALT 29 06/25/2018   AST 25 06/25/2018   NA 139 06/25/2018   K 4.1 06/25/2018   CL 101 06/25/2018   CREATININE 0.93 06/25/2018   BUN 14 06/25/2018   CO2 28 06/25/2018   TSH 2.69 06/25/2018   PSA 0.01 05/31/2017   HGBA1C 6.2 06/25/2018     No results found.  Assessment & Plan:   Mikael was seen today for annual exam, hypertension and hyperlipidemia.  Diagnoses and all orders for this visit:  Atherosclerosis of both carotid arteries- Will continue risk factor modifications to reduce the risk of complications. -     irbesartan (AVAPRO) 300 MG tablet; Take 1 tablet (300 mg total) by mouth daily. Must keep schedule  appt for future refills -     Lipid panel; Future  Essential hypertension- His blood pressure is not adequately well controlled and he has lower extremity edema.  His EKG is negative for LVH or ischemia.  I have asked him to double his dose of hydrochlorothiazide and to decrease his sodium intake.  Will continue the ARB at the current dose.  His labs are negative for secondary causes or endorgan damage. -     EKG 12-Lead -     hydrochlorothiazide (HYDRODIURIL) 25 MG tablet; Take 1 tablet (25 mg total) by mouth daily. -     irbesartan (AVAPRO) 300 MG tablet; Take 1 tablet (300 mg total) by mouth daily. Must keep schedule appt for future refills -     CBC with Differential/Platelet; Future -     Comprehensive metabolic panel; Future -     Urinalysis, Routine w reflex microscopic; Future -     TSH; Future  B12 nutritional deficiency- Will continue high-dose oral B12 supplementation.  Hyperlipidemia with target LDL less than 130- He has achieved his LDL goal and is doing well on the statin. -     Lipid panel; Future -     Comprehensive metabolic panel; Future -     TSH; Future  Prediabetes- His A1c has improved down to 6.2%.  Medical therapy is not indicated.  He will continue to work on his lifestyle modifications. -     Comprehensive metabolic panel; Future -     Hemoglobin A1c; Future  Pure hyperglyceridemia- His triglycerides are normal now.  Will continue the omega-3 fish oil supplement. -     Lipid panel; Future  Vitamin D deficiency- His vitamin D level is normal now. -     VITAMIN D 25 Hydroxy  (Vit-D Deficiency, Fractures); Future  Mixed conductive and sensorineural hearing loss of both ears -     Ambulatory referral to ENT   I have discontinued Ryker R. Bruinsma's Fish Oil, hydrochlorothiazide, and fluticasone. I am also having him start on hydrochlorothiazide. Additionally, I am having him maintain his aspirin, calcium-vitamin D, cyanocobalamin, [facility] VITAMIN B-12, multivitamin with minerals, omega-3 acid ethyl esters, atorvastatin, Vitamin D (Ergocalciferol), and irbesartan.  Meds ordered this encounter  Medications  . hydrochlorothiazide (HYDRODIURIL) 25 MG tablet    Sig: Take 1 tablet (25 mg total) by mouth daily.    Dispense:  90 tablet    Refill:  1  . irbesartan (AVAPRO) 300 MG tablet    Sig: Take 1 tablet (300 mg total) by mouth daily. Must keep schedule appt for future refills    Dispense:  90 tablet    Refill:  1   See AVS for instructions about healthy living and anticipatory guidance.  Follow-up: Return in about 2 months (around 08/24/2018).  Scarlette Calico, MD

## 2018-06-26 ENCOUNTER — Encounter: Payer: Self-pay | Admitting: Internal Medicine

## 2018-06-26 DIAGNOSIS — Z0000 Encounter for general adult medical examination without abnormal findings: Secondary | ICD-10-CM | POA: Insufficient documentation

## 2018-06-26 NOTE — Assessment & Plan Note (Signed)

## 2018-07-25 DIAGNOSIS — H6123 Impacted cerumen, bilateral: Secondary | ICD-10-CM | POA: Diagnosis not present

## 2018-07-25 DIAGNOSIS — H9113 Presbycusis, bilateral: Secondary | ICD-10-CM | POA: Insufficient documentation

## 2018-08-19 ENCOUNTER — Other Ambulatory Visit: Payer: Self-pay | Admitting: Internal Medicine

## 2018-08-19 DIAGNOSIS — E785 Hyperlipidemia, unspecified: Secondary | ICD-10-CM

## 2018-08-19 DIAGNOSIS — I6523 Occlusion and stenosis of bilateral carotid arteries: Secondary | ICD-10-CM

## 2018-08-26 ENCOUNTER — Other Ambulatory Visit: Payer: Self-pay | Admitting: Internal Medicine

## 2018-12-04 ENCOUNTER — Other Ambulatory Visit: Payer: Self-pay | Admitting: Internal Medicine

## 2018-12-13 DIAGNOSIS — H25813 Combined forms of age-related cataract, bilateral: Secondary | ICD-10-CM | POA: Diagnosis not present

## 2018-12-13 DIAGNOSIS — H52203 Unspecified astigmatism, bilateral: Secondary | ICD-10-CM | POA: Diagnosis not present

## 2018-12-13 DIAGNOSIS — H5212 Myopia, left eye: Secondary | ICD-10-CM | POA: Diagnosis not present

## 2018-12-13 DIAGNOSIS — H524 Presbyopia: Secondary | ICD-10-CM | POA: Diagnosis not present

## 2018-12-17 ENCOUNTER — Other Ambulatory Visit: Payer: Self-pay | Admitting: Internal Medicine

## 2018-12-17 DIAGNOSIS — I6523 Occlusion and stenosis of bilateral carotid arteries: Secondary | ICD-10-CM

## 2018-12-17 DIAGNOSIS — I10 Essential (primary) hypertension: Secondary | ICD-10-CM

## 2018-12-25 ENCOUNTER — Other Ambulatory Visit: Payer: Self-pay | Admitting: Internal Medicine

## 2019-01-25 ENCOUNTER — Other Ambulatory Visit: Payer: Self-pay

## 2019-01-25 ENCOUNTER — Other Ambulatory Visit: Payer: Self-pay | Admitting: Internal Medicine

## 2019-01-25 ENCOUNTER — Ambulatory Visit (INDEPENDENT_AMBULATORY_CARE_PROVIDER_SITE_OTHER): Payer: Medicare Other

## 2019-01-25 DIAGNOSIS — Z23 Encounter for immunization: Secondary | ICD-10-CM | POA: Diagnosis not present

## 2019-02-18 ENCOUNTER — Other Ambulatory Visit: Payer: Self-pay | Admitting: Internal Medicine

## 2019-03-05 ENCOUNTER — Other Ambulatory Visit: Payer: Self-pay | Admitting: Internal Medicine

## 2019-03-05 ENCOUNTER — Telehealth: Payer: Self-pay | Admitting: Internal Medicine

## 2019-03-05 DIAGNOSIS — E785 Hyperlipidemia, unspecified: Secondary | ICD-10-CM

## 2019-03-05 DIAGNOSIS — I6523 Occlusion and stenosis of bilateral carotid arteries: Secondary | ICD-10-CM

## 2019-03-07 MED ORDER — ATORVASTATIN CALCIUM 10 MG PO TABS: 10.0000 mg | ORAL_TABLET | Freq: Every day | ORAL | 0 refills | Status: DC

## 2019-03-07 NOTE — Telephone Encounter (Signed)
Pt called stating he is out of this medication. Pt set up next available appt with pcp on 03/14/2019. PT is requesting to have pills sent to pharmacy to last him until his appt. Please advise.

## 2019-03-07 NOTE — Addendum Note (Signed)
Addended by: Karle Barr on: 03/07/2019 04:59 PM   Modules accepted: Orders

## 2019-03-07 NOTE — Telephone Encounter (Signed)
erx has been sent.  

## 2019-03-10 ENCOUNTER — Other Ambulatory Visit: Payer: Self-pay | Admitting: Internal Medicine

## 2019-03-10 DIAGNOSIS — I10 Essential (primary) hypertension: Secondary | ICD-10-CM

## 2019-03-10 DIAGNOSIS — I6523 Occlusion and stenosis of bilateral carotid arteries: Secondary | ICD-10-CM

## 2019-03-14 ENCOUNTER — Other Ambulatory Visit: Payer: Self-pay

## 2019-03-14 ENCOUNTER — Ambulatory Visit (INDEPENDENT_AMBULATORY_CARE_PROVIDER_SITE_OTHER): Payer: Medicare Other | Admitting: Internal Medicine

## 2019-03-14 ENCOUNTER — Other Ambulatory Visit (INDEPENDENT_AMBULATORY_CARE_PROVIDER_SITE_OTHER): Payer: Medicare Other

## 2019-03-14 ENCOUNTER — Encounter: Payer: Self-pay | Admitting: Internal Medicine

## 2019-03-14 VITALS — BP 130/80 | HR 70 | Temp 98.1°F | Resp 16 | Ht 71.0 in | Wt 236.0 lb

## 2019-03-14 DIAGNOSIS — R7303 Prediabetes: Secondary | ICD-10-CM

## 2019-03-14 DIAGNOSIS — H6123 Impacted cerumen, bilateral: Secondary | ICD-10-CM

## 2019-03-14 DIAGNOSIS — E559 Vitamin D deficiency, unspecified: Secondary | ICD-10-CM

## 2019-03-14 DIAGNOSIS — E538 Deficiency of other specified B group vitamins: Secondary | ICD-10-CM | POA: Diagnosis not present

## 2019-03-14 DIAGNOSIS — Z23 Encounter for immunization: Secondary | ICD-10-CM | POA: Diagnosis not present

## 2019-03-14 DIAGNOSIS — N400 Enlarged prostate without lower urinary tract symptoms: Secondary | ICD-10-CM

## 2019-03-14 DIAGNOSIS — I10 Essential (primary) hypertension: Secondary | ICD-10-CM | POA: Diagnosis not present

## 2019-03-14 DIAGNOSIS — E785 Hyperlipidemia, unspecified: Secondary | ICD-10-CM | POA: Diagnosis not present

## 2019-03-14 DIAGNOSIS — I6523 Occlusion and stenosis of bilateral carotid arteries: Secondary | ICD-10-CM | POA: Diagnosis not present

## 2019-03-14 LAB — BASIC METABOLIC PANEL
BUN: 24 mg/dL — ABNORMAL HIGH (ref 6–23)
CO2: 30 mEq/L (ref 19–32)
Calcium: 9.3 mg/dL (ref 8.4–10.5)
Chloride: 98 mEq/L (ref 96–112)
Creatinine, Ser: 1.02 mg/dL (ref 0.40–1.50)
GFR: 70.18 mL/min (ref >60.00)
Glucose, Bld: 95 mg/dL (ref 70–99)
Potassium: 4.3 mEq/L (ref 3.5–5.1)
Sodium: 135 mEq/L (ref 135–145)

## 2019-03-14 LAB — CBC WITH DIFFERENTIAL/PLATELET
Basophils Absolute: 0.1 10*3/uL (ref 0.0–0.1)
Basophils Relative: 1 % (ref 0.0–3.0)
Eosinophils Absolute: 0.2 10*3/uL (ref 0.0–0.7)
Eosinophils Relative: 2.9 % (ref 0.0–5.0)
HCT: 44.1 % (ref 39.0–52.0)
Hemoglobin: 14.6 g/dL (ref 13.0–17.0)
Lymphocytes Relative: 26.9 % (ref 12.0–46.0)
Lymphs Abs: 2.1 10*3/uL (ref 0.7–4.0)
MCHC: 33.1 g/dL (ref 30.0–36.0)
MCV: 92.2 fl (ref 78.0–100.0)
Monocytes Absolute: 0.7 10*3/uL (ref 0.1–1.0)
Monocytes Relative: 9.3 % (ref 3.0–12.0)
Neutro Abs: 4.8 10*3/uL (ref 1.4–7.7)
Neutrophils Relative %: 59.9 % (ref 43.0–77.0)
Platelets: 192 10*3/uL (ref 150.0–400.0)
RBC: 4.79 Mil/uL (ref 4.22–5.81)
RDW: 13.3 % (ref 11.5–15.5)
WBC: 7.9 10*3/uL (ref 4.0–10.5)

## 2019-03-14 LAB — HEMOGLOBIN A1C: Hgb A1c MFr Bld: 6.2 % (ref 4.6–6.5)

## 2019-03-14 MED ORDER — ATORVASTATIN CALCIUM 10 MG PO TABS: 10.0000 mg | ORAL_TABLET | Freq: Every day | ORAL | 1 refills | Status: DC

## 2019-03-14 MED ORDER — ZOSTER VAC RECOMB ADJUVANTED 50 MCG/0.5ML IM SUSR: 0.5000 mL | Freq: Once | INTRAMUSCULAR | 1 refills | Status: AC

## 2019-03-14 NOTE — Patient Instructions (Signed)

## 2019-03-14 NOTE — Progress Notes (Signed)
Patient consent obtained. Irrigation with water and peroxide performed. Full view of bilateral tympanic membranes after procedure.  Patient tolerated procedure well.   

## 2019-03-14 NOTE — Progress Notes (Signed)
Subjective:  Patient ID: Louis Kent, male    DOB: 08/09/1940  Age: 78 y.o. MRN: [id number]  CC: Hypertension and Hyperlipidemia   HPI Louis Kent presents for f/up - He complains of a several week history of decreased level of hearing worse on the left than the right.  Outpatient Medications Prior to Visit  Medication Sig Dispense Refill  . aspirin 81 MG tablet Take 81 mg by mouth daily.      . calcium-vitamin D (OSCAL WITH D) 500-200 MG-UNIT per tablet Take 1 tablet by mouth daily.      . hydrochlorothiazide (HYDRODIURIL) 25 MG tablet TAKE 1 TABLET BY MOUTH EVERY DAY 90 tablet 0  . irbesartan (AVAPRO) 300 MG tablet TAKE 1 TABLET (300 MG TOTAL) BY MOUTH DAILY. MUST KEEP SCHEDULE APPT FOR FUTURE REFILLS 90 tablet 0  . Multiple Vitamins-Minerals (MULTIVITAMIN WITH MINERALS) tablet Take by mouth.    . omega-3 acid ethyl esters (LOVAZA) 1 g capsule Take by mouth.    . Vitamin D, Ergocalciferol, (DRISDOL) 1.25 MG (50000 UT) CAPS capsule TAKE 1 CAPSULE BY MOUTH ONE TIME PER WEEK 4 capsule 0  . atorvastatin (LIPITOR) 10 MG tablet Take 1 tablet (10 mg total) by mouth daily. 30 tablet 0  . Cyanocobalamin ([facility] VITAMIN B-12) 2000 MCG TBCR Take 1 tablet (2,000 mcg total) by mouth daily. Follow=up appt is due must see provider for future refills 30 tablet 0  . cyanocobalamin 2000 MCG tablet TAKE 1 TABLET (2,000 MCG TOTAL) BY MOUTH DAILY. 90 tablet 3   No facility-administered medications prior to visit.     ROS Review of Systems  Constitutional: Negative for diaphoresis, fatigue and unexpected weight change.  HENT: Positive for hearing loss. Negative for ear discharge, ear pain, sore throat and trouble swallowing.   Eyes: Negative.   Respiratory: Negative for cough, chest tightness, shortness of breath and wheezing.   Cardiovascular: Negative for chest pain, palpitations and leg swelling.  Gastrointestinal: Negative for abdominal pain, constipation, diarrhea, nausea and vomiting.   Endocrine: Negative.   Genitourinary: Negative.  Negative for difficulty urinating and dysuria.  Musculoskeletal: Negative for arthralgias and myalgias.  Skin: Negative.  Negative for color change, pallor and rash.  Neurological: Negative.  Negative for dizziness, weakness and headaches.  Hematological: Negative for adenopathy. Does not bruise/bleed easily.  Psychiatric/Behavioral: Negative.     Objective:  BP 130/80 (BP Location: Left Arm, Patient Position: Sitting, Cuff Size: Large)   Pulse 70   Temp 98.1 F (36.7 C) (Oral)   Resp 16   Ht 5\' 11"  (1.803 m)   Wt 236 lb (107 kg)   SpO2 97%   BMI 32.92 kg/m   BP Readings from Last 3 Encounters:  03/14/19 130/80  06/25/18 (!) 164/94  06/22/18 136/84    Wt Readings from Last 3 Encounters:  03/14/19 236 lb (107 kg)  06/25/18 240 lb (108.9 kg)  06/22/18 242 lb (109.8 kg)    Physical Exam Vitals signs reviewed.  Constitutional:      Appearance: Normal appearance. He is obese.  HENT:     Right Ear: Tympanic membrane, ear canal and external ear normal. Decreased hearing noted. There is impacted cerumen.     Left Ear: Tympanic membrane, ear canal and external ear normal. Decreased hearing noted. There is impacted cerumen.     Ears:     Comments: I put Colace in both ears and then irrigated it using an ear pick.  The cerumen was removed.  He  tolerated this well.  His hearing has improved.  Examination afterwards is within normal limits.    Nose: Nose normal.     Mouth/Throat:     Mouth: Mucous membranes are moist.  Eyes:     Conjunctiva/sclera: Conjunctivae normal.  Neck:     Musculoskeletal: Normal range of motion and neck supple.  Cardiovascular:     Rate and Rhythm: Normal rate and regular rhythm.     Heart sounds: No murmur.  Pulmonary:     Effort: Pulmonary effort is normal.     Breath sounds: No stridor. No wheezing, rhonchi or rales.  Abdominal:     General: Abdomen is protuberant. Bowel sounds are normal.      Palpations: There is no hepatomegaly or splenomegaly.     Tenderness: There is no abdominal tenderness.  Musculoskeletal: Normal range of motion.  Lymphadenopathy:     Cervical: No cervical adenopathy.  Skin:    General: Skin is warm.  Neurological:     General: No focal deficit present.     Mental Status: He is alert.  Psychiatric:        Mood and Affect: Mood normal.        Behavior: Behavior normal.     Lab Results  Component Value Date   WBC 7.9 03/14/2019   HGB 14.6 03/14/2019   HCT 44.1 03/14/2019   PLT 192.0 03/14/2019   GLUCOSE 95 03/14/2019   CHOL 143 06/25/2018   TRIG 136.0 06/25/2018   HDL 67.00 06/25/2018   LDLDIRECT 86.5 10/26/2006   LDLCALC 48 06/25/2018   ALT 29 06/25/2018   AST 25 06/25/2018   NA 135 03/14/2019   K 4.3 03/14/2019   CL 98 03/14/2019   CREATININE 1.02 03/14/2019   BUN 24 (H) 03/14/2019   CO2 30 03/14/2019   TSH 2.69 06/25/2018   PSA 0.00 Repeated and verified X2. (L) 03/14/2019   HGBA1C 6.2 03/14/2019    No results found.  Assessment & Plan:   Holley was seen today for hypertension and hyperlipidemia.  Diagnoses and all orders for this visit:  Need for shingles vaccine -     Zoster Vaccine Adjuvanted [facility]) injection; Inject 0.5 mLs into the muscle once for 1 dose.  Carotid atherosclerosis, bilateral -     atorvastatin (LIPITOR) 10 MG tablet; Take 1 tablet (10 mg total) by mouth daily.  Hyperlipidemia with target LDL less than 130 -     atorvastatin (LIPITOR) 10 MG tablet; Take 1 tablet (10 mg total) by mouth daily.  Essential hypertension- His blood pressure is well controlled.  Electrolytes and renal function are normal. -     Cancel: Basic metabolic panel; Future -     Basic metabolic panel; Future  Prediabetes- His A1c is at 6.2%.  Medical therapy is not indicated.  He agrees to improve his lifestyle modifications. -     Hemoglobin A1c; Future -     Basic metabolic panel; Future  Vitamin D deficiency- His  vitamin D level is normal now. -     VITAMIN D 25 Hydroxy (Vit-D Deficiency, Fractures); Future  Hearing loss of both ears due to cerumen impaction- This resolved after the cerumen impaction was adequately treated.  B12 nutritional deficiency- His B12 level is too high.  He agrees to stop taking a B12 supplement. -     Vitamin B12; Future -     CBC with Differential/Platelet; Future -     Folate; Future  Benign prostatic hyperplasia without lower urinary  tract symptoms- His PSA is low which is reassuring that he does not have prostate cancer.  There are no symptoms that need to be treated. -     PSA; Future   I have discontinued [facility]'s cyanocobalamin and [facility] Vitamin B-12. I am also having him start on Zoster Vaccine Adjuvanted. Additionally, I am having him maintain his aspirin, calcium-vitamin D, multivitamin with minerals, omega-3 acid ethyl esters, Vitamin D (Ergocalciferol), irbesartan, hydrochlorothiazide, and atorvastatin.  Meds ordered this encounter  Medications  . Zoster Vaccine Adjuvanted [facility]) injection    Sig: Inject 0.5 mLs into the muscle once for 1 dose.    Dispense:  0.5 mL    Refill:  1  . atorvastatin (LIPITOR) 10 MG tablet    Sig: Take 1 tablet (10 mg total) by mouth daily.    Dispense:  90 tablet    Refill:  1     Follow-up: Return in about 6 months (around 09/12/2019).  Scarlette Calico, MD

## 2019-03-15 ENCOUNTER — Encounter: Payer: Self-pay | Admitting: Internal Medicine

## 2019-03-15 LAB — PSA: PSA: 0 ng/mL — ABNORMAL LOW (ref 0.10–4.00)

## 2019-03-15 LAB — VITAMIN D 25 HYDROXY (VIT D DEFICIENCY, FRACTURES): VITD: 79.18 ng/mL (ref 30.00–100.00)

## 2019-03-15 LAB — FOLATE: Folate: >24.1 ng/mL (ref >5.9)

## 2019-03-15 LAB — VITAMIN B12: Vitamin B-12: >1500 pg/mL — ABNORMAL HIGH (ref 211–911)

## 2019-03-23 ENCOUNTER — Other Ambulatory Visit: Payer: Self-pay | Admitting: Internal Medicine

## 2019-03-26 ENCOUNTER — Other Ambulatory Visit: Payer: Self-pay | Admitting: Internal Medicine

## 2019-03-26 ENCOUNTER — Encounter: Payer: Self-pay | Admitting: Internal Medicine

## 2019-03-26 DIAGNOSIS — E559 Vitamin D deficiency, unspecified: Secondary | ICD-10-CM

## 2019-03-26 MED ORDER — CHOLECALCIFEROL 50 MCG (2000 UT) PO TABS: 1.0000 | ORAL_TABLET | Freq: Every day | ORAL | 1 refills | Status: DC

## 2019-06-02 ENCOUNTER — Other Ambulatory Visit: Payer: Self-pay | Admitting: Internal Medicine

## 2019-06-02 DIAGNOSIS — I10 Essential (primary) hypertension: Secondary | ICD-10-CM

## 2019-06-02 DIAGNOSIS — I6523 Occlusion and stenosis of bilateral carotid arteries: Secondary | ICD-10-CM

## 2019-06-16 ENCOUNTER — Ambulatory Visit: Payer: Medicare Other

## 2019-06-21 ENCOUNTER — Ambulatory Visit: Payer: Medicare Other

## 2019-06-23 ENCOUNTER — Ambulatory Visit: Payer: Medicare Other | Attending: Internal Medicine

## 2019-06-23 DIAGNOSIS — Z23 Encounter for immunization: Secondary | ICD-10-CM

## 2019-06-23 NOTE — Progress Notes (Signed)
   Covid-19 Vaccination Clinic  Name:  Louis Kent    MRN: [id number] DOB: Apr 20, 1941  06/23/2019  Louis Kent was observed post Covid-19 immunization for 15 minutes without incidence. He was provided with Vaccine Information Sheet and instruction to access the V-Safe system.   Louis Kent was instructed to call 911 with any severe reactions post vaccine: Marland Kitchen Difficulty breathing  . Swelling of your face and throat  . A fast heartbeat  . A bad rash all over your body  . Dizziness and weakness    Immunizations Administered    Name Date Dose VIS Date Route   Pfizer COVID-19 Vaccine 06/23/2019  9:25 AM 0.3 mL 04/26/2019 Intramuscular   Manufacturer: [place]   Lot: [id number]   [place]: [id number]

## 2019-06-25 ENCOUNTER — Telehealth: Payer: Self-pay | Admitting: Internal Medicine

## 2019-06-25 NOTE — Chronic Care Management (AMB) (Signed)
  Chronic Care Management   Note  06/25/2019 Name: Louis Kent MRN: [id number] DOB: 1940-05-28  Louis Kent is a 79 y.o. year old male who is a primary care patient of [name], MD. I reached out to [name] by phone today in response to a referral sent by Louis Kent PCP, [name], MD.   Louis Kent was given information about Chronic Care Management services today including:  1. CCM service includes personalized support from designated clinical staff supervised by his physician, including individualized plan of care and coordination with other care providers 2. 24/7 contact phone numbers for assistance for urgent and routine care needs. 3. Service will only be billed when office clinical staff spend 20 minutes or more in a month to coordinate care. 4. Only one practitioner may furnish and bill the service in a calendar month. 5. The patient may stop CCM services at any time (effective at the end of the month) by phone call to the office staff. 6. The patient will be responsible for cost sharing (co-pay) of up to 20% of the service fee (after annual deductible is met).  Patient agreed to services and verbal consent obtained.   Follow up plan:   Raynicia Dukes [employer] Scheduler

## 2019-06-25 NOTE — Chronic Care Management (AMB) (Signed)
Chronic Care Management Pharmacy  Name: [facility]  MRN: [id number] DOB: 1940-09-28   Chief Complaint/ HPI  Louis Kent,  79 y.o. , male presents for their Initial CCM visit with the clinical pharmacist via telephone.  PCP : [name], MD  Their chronic conditions include: HTN, HLD, prediabetes, carotid atherosclerosis  Office Visits:  06/25/18 Dr Ronnald Ramp OV: BP elevated, increased HCTZ to 25 mg daily, continue irbesartan 300 mg daily  03/14/19 Dr Ronnald Ramp OV: stop B12 due to level too high. Given Shingrix. No other med changes.  Consult Visit:  [id number] Dr Erik Obey, [facility] ENT: cerumen removal. Rec'd hearing aids bilaterally.   12/13/18 Dr Gershon Crane (ophthalmology): cataract check.  Medications: Outpatient Encounter Medications as of 06/26/2019  Medication Sig  . aspirin 81 MG tablet Take 81 mg by mouth daily.    Marland Kitchen atorvastatin (LIPITOR) 10 MG tablet Take 1 tablet (10 mg total) by mouth daily.  . Calcium Carb-Cholecalciferol (CALCIUM-VITAMIN D3) 600-400 MG-UNIT CAPS Take 1 capsule by mouth daily.  . Cholecalciferol 50 MCG (2000 UT) TABS Take 1 tablet (2,000 Units total) by mouth daily.  . hydrochlorothiazide (HYDRODIURIL) 25 MG tablet TAKE 1 TABLET BY MOUTH EVERY DAY  . irbesartan (AVAPRO) 300 MG tablet TAKE 1 TABLET (300 MG TOTAL) BY MOUTH DAILY. MUST KEEP SCHEDULE APPT FOR FUTURE REFILLS  . Multiple Vitamins-Minerals (MULTIVITAMIN WITH MINERALS) tablet Take by mouth.  . Omega-3 1400 MG CAPS Take by mouth.  . omega-3 acid ethyl esters (LOVAZA) 1 g capsule Take by mouth.   No facility-administered encounter medications on file as of 06/26/2019.     Current Diagnosis/Assessment:   Goals Addressed            This Visit's Progress   . Pharmacy Care Plan       Current Barriers:  . Chronic Disease Management support, education, and care coordination needs related to HTN, HLD, and Hypertriglyceridemia  Pharmacist Clinical Goal(s):  Marland Kitchen Maintain LDL < 70 . Maintain  BP < 150/90 . Prevent A1c from entering diabetic range (>6.5%) . Optimize medication regimen to meet patient's needs  Interventions: . Comprehensive medication review performed. . Discussed aspirin use for primary prevention, patient can stop aspirin . Discussed optimal timing of medications. Patient can take all meds at one time if he so chooses. . Discussed weight loss goal and diet changes  Patient Self Care Activities:  . Patient verbalizes understanding of plan to continue medications as prescribed, Self administers medications as prescribed, Calls pharmacy for medication refills, and Calls provider office for new concerns or questions  Initial goal documentation         Hypertension    Office blood pressures are  BP Readings from Last 3 Encounters:  03/14/19 130/80  06/25/18 (!) 164/94  06/22/18 136/84   Patient is currently controlled on the following medications: HCTZ 25 mg daily, irbesartan 300 mg daily   Patient has failed these meds in the past: n/a  Patient checks BP at home 1-2x per week  Patient home BP readings are ranging: 130s/70s-140s/80s  We discussed: - diet and exercise extensively  - optimal timing of BP meds including taking at least one BP med at night  Plan  Continue current medications and control with diet and exercise   Hyperlipidemia/Carotid atherosclerosis   Lipid Panel     Component Value Date/Time   CHOL 143 06/25/2018 1414   TRIG 136.0 06/25/2018 1414   HDL 67.00 06/25/2018 1414   CHOLHDL 2 06/25/2018 1414  VLDL 27.2 06/25/2018 1414   LDLCALC 48 06/25/2018 1414    Patient has failed these meds in past: fenofibrate Patient is currently controlled on the following medications: atorvastatin 10 mg daily, OTC omega 3 fish oil, aspirin 81 mg daily  We discussed:  - diet and exercise extensively - cholesterol is at goal - pt started taking aspirin decades ago for primary prevention, he wants to know if he should continue. We  discussed safety of aspirin in patients > 70 years, pt denies issues with bleeding in urine or stool. PCP agrees to stop aspirin.  Plan  Stop aspirin Continue atorvastatin and fish oil   Prediabetes   Recent Relevant Labs: Lab Results  Component Value Date/Time   HGBA1C 6.2 03/14/2019 04:33 PM   HGBA1C 6.2 06/25/2018 02:14 PM    No medication indicated.  We discussed: diet and exercise extensively  Plan  Continue control with diet and exercise  Health maintenance   Pt currently controlled on: calcium-vitamin D, multivitamin   We discussed: duration of therapy for Vitamin D replacement vs maintenance therapy. Also discussed hip replacement and need for antibiotics prior to dental procedures.   Plan   Continue current medications  Medication Management   [facility]. Pt does not use pill box, he keeps meds in vials and is very compliant. Pt takes about half of meds in morning and half at night.   We discussed: pt can take all meds at one time to make administration easier, however he would like to continue AM and PM routine.  Plan  Continue current medications    Follow up: CPP phone call 11/20/19. Pt has CPP phone number if issues arise.   Charlene Brooke, PharmD Clinical Pharmacist [facility] [phone]

## 2019-06-26 ENCOUNTER — Ambulatory Visit: Payer: Medicare Other | Admitting: Pharmacist

## 2019-06-26 ENCOUNTER — Other Ambulatory Visit: Payer: Self-pay

## 2019-06-26 DIAGNOSIS — I10 Essential (primary) hypertension: Secondary | ICD-10-CM

## 2019-06-26 DIAGNOSIS — E785 Hyperlipidemia, unspecified: Secondary | ICD-10-CM

## 2019-06-26 DIAGNOSIS — R7303 Prediabetes: Secondary | ICD-10-CM

## 2019-06-26 NOTE — Patient Instructions (Addendum)
Visit Information  Thank you for meeting with me to discuss your medications! I look forward to working with you to achieve your health care goals. You can reach me at [phone] by call or text.  Below is a summary of what we talked about during our meeting:   Goals Addressed            This Visit's Progress   . Pharmacy Care Plan       Current Barriers:  . Chronic Disease Management support, education, and care coordination needs related to HTN, HLD, and Hypertriglyceridemia  Pharmacist Clinical Goal(s):  Marland Kitchen Maintain LDL < 70 . Maintain BP < 150/90 . Prevent A1c from entering diabetic range (>6.5%) . Optimize medication regimen to meet needs  Interventions: . Comprehensive medication review performed. . Discussed aspirin use for primary prevention, you can stop aspirin . Discussed optimal timing of medications. You can take all meds at one time if you so choose. Marland Kitchen Discussed weight loss goal and diet changes  Patient Self Care Activities:  . Patient verbalizes understanding of plan to continue medications as prescribed, Self administers medications as prescribed, Calls pharmacy for medication refills, and Calls provider office for new concerns or questions  Initial goal documentation     Mr. Yi was given information about Chronic Care Management services today including:  1. CCM service includes personalized support from designated clinical staff supervised by his physician, including individualized plan of care and coordination with other care providers 2. 24/7 contact phone numbers for assistance for urgent and routine care needs. 3. Service will only be billed when office clinical staff spend 20 minutes or more in a month to coordinate care. 4. Only one practitioner may furnish and bill the service in a calendar month. 5. The patient may stop CCM services at any time (effective at the end of the month) by phone call to the office staff. 6. The patient will be  responsible for cost sharing (co-pay) of up to 20% of the service fee (after annual deductible is met).  Patient agreed to services and verbal consent obtained.   Print copy of patient instructions provided.  Telephone follow up appointment with pharmacy team member scheduled for: 5 months   Charlene Brooke, PharmD Clinical Pharmacist [facility] [phone]

## 2019-07-16 ENCOUNTER — Ambulatory Visit: Payer: Medicare Other | Attending: Internal Medicine

## 2019-07-16 DIAGNOSIS — Z23 Encounter for immunization: Secondary | ICD-10-CM | POA: Insufficient documentation

## 2019-07-16 NOTE — Progress Notes (Signed)
   Covid-19 Vaccination Clinic  Name:  Louis Kent    MRN: [id number] DOB: 1940-06-09  07/16/2019  [name] was observed post Covid-19 immunization for 15 minutes without incident. He was provided with Vaccine Information Sheet and instruction to access the V-Safe system.   [name] was instructed to call 911 with any severe reactions post vaccine: Marland Kitchen Difficulty breathing  . Swelling of face and throat  . A fast heartbeat  . A bad rash all over body  . Dizziness and weakness   Immunizations Administered    Name Date Dose VIS Date Route   Pfizer COVID-19 Vaccine 07/16/2019 10:56 AM 0.3 mL 04/26/2019 Intramuscular   Manufacturer: [place]   Lot: [id number]   [place]: [id number]

## 2019-08-23 ENCOUNTER — Ambulatory Visit (INDEPENDENT_AMBULATORY_CARE_PROVIDER_SITE_OTHER): Payer: Medicare Other | Admitting: Family

## 2019-08-23 DIAGNOSIS — J019 Acute sinusitis, unspecified: Secondary | ICD-10-CM

## 2019-08-23 MED ORDER — FLUTICASONE PROPIONATE 50 MCG/ACT NA SUSP: 2.0000 | Freq: Every day | NASAL | 6 refills | Status: DC

## 2019-08-23 MED ORDER — AMOXICILLIN-POT CLAVULANATE 875-125 MG PO TABS: 1.0000 | ORAL_TABLET | Freq: Two times a day (BID) | ORAL | 0 refills | Status: AC

## 2019-08-23 NOTE — Progress Notes (Signed)
Louis Kent is a 79 y.o. male with the following history as recorded in EpicCare:  Patient Active Problem List   Diagnosis Date Noted  . Routine general medical examination at a health care facility 06/26/2018  . Mixed conductive and sensorineural hearing loss of both ears 06/25/2018  . Hearing loss of both ears due to cerumen impaction 07/20/2017  . Vitamin D deficiency 09/17/2015  . Carotid atherosclerosis 01/05/2015  . Hyperlipidemia with target LDL less than 130 09/03/2014  . Symptomatic cholelithiasis 12/30/2013  . Ventral hernia 12/30/2013  . Prediabetes 12/12/2013  . Osteoarthritis of hip 03/03/2011  . B12 nutritional deficiency 09/16/2009  . Morbid obesity ([place]) 04/01/2009  . Pure hyperglyceridemia 02/07/2007  . Essential hypertension 02/07/2007    Current Outpatient Medications  Medication Sig Dispense Refill  . amoxicillin-clavulanate (AUGMENTIN) 875-125 MG tablet Take 1 tablet by mouth 2 (two) times daily for 10 days. 20 tablet 0  . aspirin 81 MG tablet Take 81 mg by mouth daily.      Marland Kitchen atorvastatin (LIPITOR) 10 MG tablet Take 1 tablet (10 mg total) by mouth daily. 90 tablet 1  . Calcium Carb-Cholecalciferol (CALCIUM-VITAMIN D3) 600-400 MG-UNIT CAPS Take 1 capsule by mouth daily.    . Cholecalciferol 50 MCG (2000 UT) TABS Take 1 tablet (2,000 Units total) by mouth daily. 90 tablet 1  . fluticasone (FLONASE) 50 MCG/ACT nasal spray Place 2 sprays into both nostrils daily. 16 g 6  . hydrochlorothiazide (HYDRODIURIL) 25 MG tablet TAKE 1 TABLET BY MOUTH EVERY DAY 90 tablet 0  . irbesartan (AVAPRO) 300 MG tablet TAKE 1 TABLET (300 MG TOTAL) BY MOUTH DAILY. MUST KEEP SCHEDULE APPT FOR FUTURE REFILLS 90 tablet 0  . Multiple Vitamins-Minerals (MULTIVITAMIN WITH MINERALS) tablet Take by mouth.    . Omega-3 1400 MG CAPS Take by mouth.    . omega-3 acid ethyl esters (LOVAZA) 1 g capsule Take by mouth.     No current facility-administered medications for this visit.     Allergies: Patient has no known allergies.  Past Medical History:  Diagnosis Date  . Hyperlipidemia   . Hypertension   . S/P prostatectomy     Past Surgical History:  Procedure Laterality Date  . ANKLE ARTHROSCOPY W/ OPEN REPAIR    . bilater hip replacements    . PROSTATE SURGERY    . TONSILLECTOMY      Family History  Problem Relation Age of Onset  . Stroke Mother   . Diabetes Mother   . Lung cancer Father        lung cancer  . Lung cancer Unknown     Social History   Tobacco Use  . Smoking status: Never Smoker  . Smokeless tobacco: Never Used  Substance Use Topics  . Alcohol use: No    Subjective:    I connected with Louis Kent on 08/23/19 at  1:20 PM EDT by a video enabled telemedicine application and verified that I am speaking with the correct person using two identifiers.   I discussed the limitations of evaluation and management by telemedicine and the availability of in person appointments. The patient expressed understanding and agreed to proceed. Provider in office/ patient is at home; provider and patient are only 2 people on video call.   Patient has concerns for sinus infection; symptoms x 1 week; +post-nasal drainage; taking OTC medications; no fever; no chest pain or shortness of breath;  Is fully vaccinated for COVID;     Objective:  There were  no vitals filed for this visit.  General: Well developed, well nourished, in no acute distress  Skin : Warm and dry.  Head: Normocephalic and atraumatic  Lungs: Respirations unlabored;  Neurologic: Alert and oriented; speech intact; face symmetrical; moves all extremities well; CNII-XII intact without focal deficit   Assessment:  1. Acute sinusitis, recurrence not specified, unspecified location     Plan:  Rx for Augmentin 875 mg bid x 10 days; continue OTC cough/ cold medication; Rx for Flonase NS; increase fluids, rest and follow-up worse, no better.    No follow-ups on file.  No orders of the  defined types were placed in this encounter.   Requested Prescriptions   Signed Prescriptions Disp Refills  . amoxicillin-clavulanate (AUGMENTIN) 875-125 MG tablet 20 tablet 0    Sig: Take 1 tablet by mouth 2 (two) times daily for 10 days.  . fluticasone (FLONASE) 50 MCG/ACT nasal spray 16 g 6    Sig: Place 2 sprays into both nostrils daily.

## 2019-09-04 ENCOUNTER — Other Ambulatory Visit: Payer: Self-pay | Admitting: Internal Medicine

## 2019-09-04 DIAGNOSIS — I10 Essential (primary) hypertension: Secondary | ICD-10-CM

## 2019-09-04 DIAGNOSIS — I6523 Occlusion and stenosis of bilateral carotid arteries: Secondary | ICD-10-CM

## 2019-09-10 ENCOUNTER — Encounter: Payer: Self-pay | Admitting: Internal Medicine

## 2019-09-10 ENCOUNTER — Other Ambulatory Visit: Payer: Self-pay

## 2019-09-10 ENCOUNTER — Ambulatory Visit (INDEPENDENT_AMBULATORY_CARE_PROVIDER_SITE_OTHER): Payer: Medicare Other | Admitting: Internal Medicine

## 2019-09-10 VITALS — BP 162/96 | HR 77 | Temp 98.2°F | Resp 16 | Ht 71.0 in | Wt 244.0 lb

## 2019-09-10 DIAGNOSIS — E785 Hyperlipidemia, unspecified: Secondary | ICD-10-CM

## 2019-09-10 DIAGNOSIS — I10 Essential (primary) hypertension: Secondary | ICD-10-CM | POA: Diagnosis not present

## 2019-09-10 DIAGNOSIS — E538 Deficiency of other specified B group vitamins: Secondary | ICD-10-CM

## 2019-09-10 DIAGNOSIS — E559 Vitamin D deficiency, unspecified: Secondary | ICD-10-CM | POA: Diagnosis not present

## 2019-09-10 DIAGNOSIS — I6523 Occlusion and stenosis of bilateral carotid arteries: Secondary | ICD-10-CM

## 2019-09-10 DIAGNOSIS — R4789 Other speech disturbances: Secondary | ICD-10-CM | POA: Diagnosis not present

## 2019-09-10 DIAGNOSIS — Z0000 Encounter for general adult medical examination without abnormal findings: Secondary | ICD-10-CM

## 2019-09-10 DIAGNOSIS — R7303 Prediabetes: Secondary | ICD-10-CM | POA: Diagnosis not present

## 2019-09-10 DIAGNOSIS — H6123 Impacted cerumen, bilateral: Secondary | ICD-10-CM

## 2019-09-10 LAB — CBC WITH DIFFERENTIAL/PLATELET
Basophils Absolute: 0.1 10*3/uL (ref 0.0–0.1)
Basophils Relative: 0.9 % (ref 0.0–3.0)
Eosinophils Absolute: 0.2 10*3/uL (ref 0.0–0.7)
Eosinophils Relative: 3 % (ref 0.0–5.0)
HCT: 42.5 % (ref 39.0–52.0)
Hemoglobin: 14.1 g/dL (ref 13.0–17.0)
Lymphocytes Relative: 23.8 % (ref 12.0–46.0)
Lymphs Abs: 1.6 10*3/uL (ref 0.7–4.0)
MCHC: 33.3 g/dL (ref 30.0–36.0)
MCV: 93.2 fl (ref 78.0–100.0)
Monocytes Absolute: 0.7 10*3/uL (ref 0.1–1.0)
Monocytes Relative: 9.6 % (ref 3.0–12.0)
Neutro Abs: 4.3 10*3/uL (ref 1.4–7.7)
Neutrophils Relative %: 62.7 % (ref 43.0–77.0)
Platelets: 208 10*3/uL (ref 150.0–400.0)
RBC: 4.56 Mil/uL (ref 4.22–5.81)
RDW: 13.3 % (ref 11.5–15.5)
WBC: 6.8 10*3/uL (ref 4.0–10.5)

## 2019-09-10 LAB — BASIC METABOLIC PANEL
BUN: 17 mg/dL (ref 6–23)
CO2: 31 mEq/L (ref 19–32)
Calcium: 9 mg/dL (ref 8.4–10.5)
Chloride: 101 mEq/L (ref 96–112)
Creatinine, Ser: 0.93 mg/dL (ref 0.40–1.50)
GFR: 77.98 mL/min (ref >60.00)
Glucose, Bld: 74 mg/dL (ref 70–99)
Potassium: 4.1 mEq/L (ref 3.5–5.1)
Sodium: 137 mEq/L (ref 135–145)

## 2019-09-10 LAB — FOLATE: Folate: 22.2 ng/mL (ref >5.9)

## 2019-09-10 LAB — LIPID PANEL
Cholesterol: 155 mg/dL (ref 0–200)
HDL: 57.4 mg/dL (ref >39.00)
LDL Cholesterol: 62 mg/dL (ref 0–99)
NonHDL: 97.44
Total CHOL/HDL Ratio: 3
Triglycerides: 176 mg/dL — ABNORMAL HIGH (ref 0.0–149.0)
VLDL: 35.2 mg/dL (ref 0.0–40.0)

## 2019-09-10 LAB — TSH: TSH: 2.46 u[IU]/mL (ref 0.35–4.50)

## 2019-09-10 LAB — HEMOGLOBIN A1C: Hgb A1c MFr Bld: 6.3 % (ref 4.6–6.5)

## 2019-09-10 LAB — VITAMIN D 25 HYDROXY (VIT D DEFICIENCY, FRACTURES): VITD: 46.67 ng/mL (ref 30.00–100.00)

## 2019-09-10 LAB — VITAMIN B12: Vitamin B-12: 548 pg/mL (ref 211–911)

## 2019-09-10 MED ORDER — IRBESARTAN 300 MG PO TABS: 300.0000 mg | ORAL_TABLET | Freq: Every day | ORAL | 1 refills | Status: DC

## 2019-09-10 MED ORDER — HYDROCHLOROTHIAZIDE 25 MG PO TABS: 25.0000 mg | ORAL_TABLET | Freq: Every day | ORAL | 1 refills | Status: DC

## 2019-09-10 NOTE — Patient Instructions (Signed)

## 2019-09-10 NOTE — Progress Notes (Signed)
Patient consent obtained. Irrigation with water and peroxide performed. Full view of bilateral tympanic membranes after procedure.  Patient tolerated procedure well.   

## 2019-09-10 NOTE — Progress Notes (Signed)
Subjective:  Patient ID: Louis Kent, male    DOB: [birth year]  Age: 79 y.o. MRN: [id number]  CC: Annual Exam and Hypertension  This visit occurred during the SARS-CoV-2 public health emergency.  Safety protocols were in place, including screening questions prior to the visit, additional usage of staff PPE, and extensive cleaning of exam room while observing appropriate contact time as indicated for disinfecting solutions.    HPI [name] presents for a CPX.  Over the last year he has developed word finding difficulty and forgetfulness.  He wants to undergo cognitive testing.  He is active and denies any recent episodes of chest pain, shortness of breath, palpitations, edema, or fatigue.  He ran out of his antihypertensives a couple of days ago.  He complains of weight gain.  Outpatient Medications Prior to Visit  Medication Sig Dispense Refill  . atorvastatin (LIPITOR) 10 MG tablet Take 1 tablet (10 mg total) by mouth daily. 90 tablet 1  . Calcium Carb-Cholecalciferol (CALCIUM-VITAMIN D3) 600-400 MG-UNIT CAPS Take 1 capsule by mouth daily.    . Cholecalciferol 50 MCG (2000 UT) TABS Take 1 tablet (2,000 Units total) by mouth daily. 90 tablet 1  . fluticasone (FLONASE) 50 MCG/ACT nasal spray Place 2 sprays into both nostrils daily. 16 g 6  . Multiple Vitamins-Minerals (MULTIVITAMIN WITH MINERALS) tablet Take by mouth.    . Omega-3 1400 MG CAPS Take by mouth.    Marland Kitchen aspirin 81 MG tablet Take 81 mg by mouth daily.      . hydrochlorothiazide (HYDRODIURIL) 25 MG tablet TAKE 1 TABLET BY MOUTH EVERY DAY 90 tablet 0  . irbesartan (AVAPRO) 300 MG tablet TAKE 1 TABLET (300 MG TOTAL) BY MOUTH DAILY. MUST KEEP SCHEDULE APPT FOR FUTURE REFILLS 90 tablet 0  . omega-3 acid ethyl esters (LOVAZA) 1 g capsule Take by mouth.     No facility-administered medications prior to visit.    ROS Review of Systems  Constitutional: Positive for unexpected weight change (wt gain). Negative for appetite  change, chills, diaphoresis and fatigue.  HENT: Positive for hearing loss. Negative for congestion and facial swelling.   Eyes: Negative.   Respiratory: Negative for cough, chest tightness, shortness of breath and wheezing.   Cardiovascular: Negative for chest pain, palpitations and leg swelling.  Gastrointestinal: Negative for abdominal pain, constipation, diarrhea, nausea and vomiting.  Endocrine: Negative.   Genitourinary: Negative.  Negative for difficulty urinating.  Musculoskeletal: Negative.  Negative for myalgias.  Skin: Negative.   Neurological: Positive for speech difficulty. Negative for dizziness, weakness and light-headedness.  Hematological: Negative for adenopathy. Does not bruise/bleed easily.  Psychiatric/Behavioral: Positive for decreased concentration. Negative for agitation, behavioral problems, confusion, dysphoric mood, self-injury, sleep disturbance and suicidal ideas. The patient is not nervous/anxious.     Objective:  BP (!) 162/96 (BP Location: Left Arm, Patient Position: Sitting, Cuff Size: Large)   Pulse 77   Temp 98.2 F (36.8 C) (Oral)   Resp 16   Ht 5\' 11"  (1.803 m)   Wt 244 lb (110.7 kg)   SpO2 99%   BMI 34.03 kg/m   BP Readings from Last 3 Encounters:  09/10/19 (!) 162/96  03/14/19 130/80  06/25/18 (!) 164/94    Wt Readings from Last 3 Encounters:  09/10/19 244 lb (110.7 kg)  03/14/19 236 lb (107 kg)  06/25/18 240 lb (108.9 kg)    Physical Exam Vitals reviewed.  Constitutional:      General: He is not in acute distress.  Appearance: He is obese. He is not ill-appearing, toxic-appearing or diaphoretic.  HENT:     Right Ear: Tympanic membrane and external ear normal. Decreased hearing noted. There is impacted cerumen.     Left Ear: Tympanic membrane and external ear normal. Decreased hearing noted. There is impacted cerumen.  Eyes:     General: No scleral icterus.    Conjunctiva/sclera: Conjunctivae normal.  Cardiovascular:     Rate  and Rhythm: Normal rate and regular rhythm.     Heart sounds: No murmur.  Pulmonary:     Effort: Pulmonary effort is normal.     Breath sounds: No stridor. No wheezing, rhonchi or rales.  Abdominal:     General: Abdomen is protuberant. Bowel sounds are normal. There is no distension.     Palpations: Abdomen is soft. There is no hepatomegaly, splenomegaly or mass.     Tenderness: There is no abdominal tenderness.  Musculoskeletal:        General: Normal range of motion.     Cervical back: Neck supple.     Right lower leg: No edema.     Left lower leg: No edema.  Lymphadenopathy:     Cervical: No cervical adenopathy.  Skin:    General: Skin is warm.     Coloration: Skin is not pale.  Neurological:     General: No focal deficit present.     Mental Status: He is alert.  Psychiatric:        Mood and Affect: Mood normal.        Behavior: Behavior normal.     Lab Results  Component Value Date   WBC 6.8 09/10/2019   HGB 14.1 09/10/2019   HCT 42.5 09/10/2019   PLT 208.0 09/10/2019   GLUCOSE 74 09/10/2019   CHOL 155 09/10/2019   TRIG 176.0 (H) 09/10/2019   HDL 57.40 09/10/2019   LDLDIRECT 86.5 10/26/2006   LDLCALC 62 09/10/2019   ALT 29 06/25/2018   AST 25 06/25/2018   NA 137 09/10/2019   K 4.1 09/10/2019   CL 101 09/10/2019   CREATININE 0.93 09/10/2019   BUN 17 09/10/2019   CO2 31 09/10/2019   TSH 2.46 09/10/2019   PSA 0.00 Repeated and verified X2. (L) 03/14/2019   HGBA1C 6.3 09/10/2019    No results found.  Assessment & Plan:   Jaan was seen today for annual exam and hypertension.  Diagnoses and all orders for this visit:  Atherosclerosis of both carotid arteries-  Will continue to address risk factor modifications. -     Lipid panel; Future -     irbesartan (AVAPRO) 300 MG tablet; Take 1 tablet (300 mg total) by mouth daily. -     Lipid panel  Essential hypertension- His blood pressure is not adequately well controlled.  I have asked him to restart  irbesartan and hydrochlorothiazide and to improve his lifestyle modifications. -     irbesartan (AVAPRO) 300 MG tablet; Take 1 tablet (300 mg total) by mouth daily. -     hydrochlorothiazide (HYDRODIURIL) 25 MG tablet; Take 1 tablet (25 mg total) by mouth daily.  B12 nutritional deficiency- His H&H are normal.  His B12 and folate levels are normal. -     CBC with Differential/Platelet; Future -     Vitamin B12; Future -     Folate; Future -     Folate -     Vitamin B12 -     CBC with Differential/Platelet  Prediabetes- His A1c is at  6.3%.  Medical therapy is not indicated. -     Hemoglobin A1c; Future -     Basic metabolic panel; Future -     Basic metabolic panel -     Hemoglobin A1c  Routine general medical examination at a health care facility- Exam completed, labs reviewed, vaccines reviewed and updated, no cancer screenings are indicated, patient education was given.  Vitamin D deficiency -     VITAMIN D 25 Hydroxy (Vit-D Deficiency, Fractures); Future -     VITAMIN D 25 Hydroxy (Vit-D Deficiency, Fractures)  Hyperlipidemia with target LDL less than 130- He has achieved his LDL goal and is doing well on the statin. -     Lipid panel; Future -     TSH; Future -     TSH -     Lipid panel  Hearing loss of both ears due to cerumen impaction- Hearing has returned to normal after the cerumen was removed.  The examination afterwards is normal.  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  He will see neurology to undergo cognitive testing. -     Ambulatory referral to Neurology   I have discontinued Barnell R. Aries's aspirin and omega-3 acid ethyl esters. I have also changed his irbesartan and hydrochlorothiazide. Additionally, I am having him maintain his multivitamin with minerals, atorvastatin, Cholecalciferol, Omega-3, Calcium-Vitamin D3, and fluticasone.  Meds ordered this encounter  Medications  . irbesartan (AVAPRO) 300 MG tablet     Sig: Take 1 tablet (300 mg total) by mouth daily.    Dispense:  90 tablet    Refill:  1  . hydrochlorothiazide (HYDRODIURIL) 25 MG tablet    Sig: Take 1 tablet (25 mg total) by mouth daily.    Dispense:  90 tablet    Refill:  1     Follow-up: Return in about 3 months (around 12/10/2019).  Scarlette Calico, MD

## 2019-09-12 ENCOUNTER — Encounter: Payer: Self-pay | Admitting: Neurology

## 2019-09-22 ENCOUNTER — Other Ambulatory Visit: Payer: Self-pay | Admitting: Internal Medicine

## 2019-09-22 DIAGNOSIS — I6523 Occlusion and stenosis of bilateral carotid arteries: Secondary | ICD-10-CM

## 2019-09-22 DIAGNOSIS — E785 Hyperlipidemia, unspecified: Secondary | ICD-10-CM

## 2019-09-22 DIAGNOSIS — E559 Vitamin D deficiency, unspecified: Secondary | ICD-10-CM

## 2019-11-20 ENCOUNTER — Other Ambulatory Visit: Payer: Self-pay

## 2019-11-20 ENCOUNTER — Ambulatory Visit: Payer: Medicare Other | Admitting: Pharmacist

## 2019-11-20 DIAGNOSIS — R7303 Prediabetes: Secondary | ICD-10-CM

## 2019-11-20 DIAGNOSIS — E785 Hyperlipidemia, unspecified: Secondary | ICD-10-CM

## 2019-11-20 DIAGNOSIS — I10 Essential (primary) hypertension: Secondary | ICD-10-CM

## 2019-11-20 NOTE — Chronic Care Management (AMB) (Signed)
Chronic Care Management Pharmacy  Name: HIEU HERMS  MRN: [id number] DOB: 25-Jun-1940   Chief Complaint/ HPI  Louis Kent,  79 y.o. , male presents for their Follow-Up CCM visit with the clinical pharmacist via telephone.  PCP : [name], MD Patient Care Team: [name], MD as PCP - General (Internal Medicine) Charlton Haws, [facility] as Pharmacist (Pharmacist)  Their chronic conditions include: HTN, HLD, prediabetes, carotid atherosclerosis  Office Visits:  09/10/19 Dr Ronnald Ramp OV: pt has developed word finding difficulty and forgetfulness. Pt ran out of BP meds a few days prior, BP elevated in clinic 162/96. Refilled irbesartan and HCTZ. Referred to neuro for cognitive testing.  08/23/19 NP Marvis Repress OV: acute sinusitis, rx'd Augmentin and Flonase.   06/25/18 Dr Ronnald Ramp OV: BP elevated, increased HCTZ to 25 mg daily, continue irbesartan 300 mg daily  03/14/19 Dr Ronnald Ramp OV: stop B12 due to level too high. Given Shingrix. No other med changes.  Consult Visit:  0/17/51 Dr Erik Obey, [facility]: cerumen removal. Rec'd hearing aids bilaterally.   12/13/18 Dr Gershon Crane (ophthalmology): cataract check.  Medications: Outpatient Encounter Medications as of 11/20/2019  Medication Sig  . atorvastatin (LIPITOR) 10 MG tablet TAKE 1 TABLET BY MOUTH EVERY DAY  . Calcium Carb-Cholecalciferol (CALCIUM-VITAMIN D3) 600-400 MG-UNIT CAPS Take 1 capsule by mouth daily.  . Cholecalciferol (VITAMIN D3) 50 MCG (2000 UT) TABS TAKE 1 TABLET BY MOUTH EVERY DAY  . fluticasone (FLONASE) 50 MCG/ACT nasal spray Place 2 sprays into both nostrils daily.  . hydrochlorothiazide (HYDRODIURIL) 25 MG tablet Take 1 tablet (25 mg total) by mouth daily.  . irbesartan (AVAPRO) 300 MG tablet Take 1 tablet (300 mg total) by mouth daily.  . Multiple Vitamins-Minerals (MULTIVITAMIN WITH MINERALS) tablet Take by mouth.  . Omega-3 1400 MG CAPS Take by mouth.  . [DISCONTINUED] atorvastatin (LIPITOR) 10 MG tablet  Take 1 tablet (10 mg total) by mouth daily.  . [DISCONTINUED] Cholecalciferol 50 MCG (2000 UT) TABS Take 1 tablet (2,000 Units total) by mouth daily.   No facility-administered encounter medications on file as of 11/20/2019.     Current Diagnosis/Assessment:  SDOH Interventions     Most Recent Value  SDOH Interventions  Financial Strain Interventions Intervention Not Indicated     Goals Addressed            This Visit's Progress   . Pharmacy Care Plan       CARE PLAN ENTRY (see longitudinal plan of care for additional care plan information)  Current Barriers:  . Chronic Disease Management support, education, and care coordination needs related to Hypertension, Hyperlipidemia, and Prediabetes   Hypertension BP Readings from Last 3 Encounters:  09/10/19 (!) 162/96  03/14/19 130/80  06/25/18 (!) 164/94 .  Pharmacist Clinical Goal(s): o Over the next 180 days, patient will work with PharmD and providers to achieve BP goal <140/90 . Current regimen:  o HCTZ 25 mg daily,  o irbesartan 300 mg daily  . Interventions: o Discussed BP goals and benefits of medication for prevention of heart attack / stroke . Patient self care activities - Over the next 180 days, patient will: o Check BP 3 times weekly, document, and provide at future appointments o Ensure daily salt intake < 2300 mg/day  Hyperlipidemia Lab Results  Component Value Date/Time   LDLCALC 62 09/10/2019 11:58 AM   LDLDIRECT 86.5 10/26/2006 11:12 AM .  Pharmacist Clinical Goal(s): o Over the next 180 days, patient will work with PharmD  and providers to maintain LDL goal < 100 . Current regimen:  o atorvastatin 10 mg daily,  o OTC omega 3 fish oil . Interventions: o Discussed cholesterol goals and benefits of medication for prevention of heart attack / stroke . Patient self care activities - Over the next 180 days, patient will: o Continue medication as prescribed  Prediabetes Lab Results  Component Value  Date/Time   HGBA1C 6.3 09/10/2019 11:58 AM   HGBA1C 6.2 03/14/2019 04:33 PM .  Pharmacist Clinical Goal(s): o Over the next 180 days, patient will work with PharmD and providers to maintain A1c goal <6.5% . Current regimen:  o No medications . Interventions: o Discussed blood sugar goals and benefits of diet/exercise for prevention of diabetes . Patient self care activities - Over the next 180 days, patient will: o Maintain healthy diet high in protein, vegetables   Medication management . Pharmacist Clinical Goal(s): o Over the next 180 days, patient will work with PharmD and providers to maintain optimal medication adherence . Current [facility] . Interventions o Comprehensive medication review performed. o Continue current medication management strategy . Patient self care activities - Over the next 180 days, patient will: o Focus on medication adherence by fill date o Take medications as prescribed o Report any questions or concerns to PharmD and/or provider(s)  Please see past updates related to this goal by clicking on the "Past Updates" button in the selected goal         Hypertension   BP goal < 140/90  Office blood pressures are  BP Readings from Last 3 Encounters:  09/10/19 (!) 162/96  03/14/19 130/80  06/25/18 (!) 164/94   Kidney Function Lab Results  Component Value Date/Time   CREATININE 0.93 09/10/2019 11:58 AM   CREATININE 1.02 03/14/2019 04:33 PM   GFR 77.98 09/10/2019 11:58 AM   GFRNONAA 68.61 02/04/2010 11:20 AM   GFRAA 95 09/18/2007 11:38 AM   K 4.1 09/10/2019 11:58 AM   K 4.3 03/14/2019 04:33 PM   Patient checks BP at home 1-2x per week Patient home BP readings are ranging: 140s/80s  Patient is currently controlled on the following medications:   HCTZ 25 mg daily,   irbesartan 300 mg daily   We discussed: - pt reports home BP has not changed much, he remains compliant with medications  Plan  Continue current medications and  control with diet and exercise   Hyperlipidemia/Carotid atherosclerosis   LDL goal < 100  Lipid Panel     Component Value Date/Time   CHOL 155 09/10/2019 1158   TRIG 176.0 (H) 09/10/2019 1158   HDL 57.40 09/10/2019 1158   CHOLHDL 3 09/10/2019 1158   VLDL 35.2 09/10/2019 1158   LDLCALC 62 09/10/2019 1158   LDLDIRECT 86.5 10/26/2006 1112   The ASCVD Risk score (Goff DC Jr., et al., 2013) failed to calculate for the following reasons:   The 2013 ASCVD risk score is only valid for ages 39 to 62  Patient has failed these meds in past: fenofibrate Patient is currently controlled on the following medications:   atorvastatin 10 mg daily,   OTC omega 3 fish oil  We discussed:  - cholesterol is at goal - pt stopped statin after last visit due to lack of benefit  Plan  Continue current medications  Prediabetes   A1c goal < 6.5%  Recent Relevant Labs: Lab Results  Component Value Date/Time   HGBA1C 6.3 09/10/2019 11:58 AM   HGBA1C 6.2 03/14/2019 04:33 PM  No medication indicated.  We discussed: diet and exercise extensively  Plan  Continue control with diet and exercise  Health maintenance   Lab Results  Component Value Date/Time   VD25OH 46.67 09/10/2019 11:58 AM   VD25OH 79.18 03/14/2019 04:33 PM   Pt currently controlled on:   Calcium-Vitamin D 600mg -400IU daily  Vitamin D3 2000 IU daily  multivitamin   Flonase nasal spray PRN  We discussed: Vitamin D level is at goal; maintenance therapy will prevent it from become deficient again  Plan   Continue current medications  Medication Management   [facility] Uses pill box? No - prefers bottles Pt endorses 100% compliance  We discussed: [facility] with [facility]; pt is satisfied with their services  Plan  Continue current medication management strategy    Follow up: 6 month phone visit  Charlene Brooke, PharmD Clinical Pharmacist [facility] [phone]

## 2019-11-20 NOTE — Patient Instructions (Addendum)
Visit Information  Phone number for Pharmacist: [phone]  Goals Addressed            This Visit's Progress   . Pharmacy Care Plan       CARE PLAN ENTRY (see longitudinal plan of care for additional care plan information)  Current Barriers:  . Chronic Disease Management support, education, and care coordination needs related to Hypertension, Hyperlipidemia, and Prediabetes   Hypertension BP Readings from Last 3 Encounters:  09/10/19 (!) 162/96  03/14/19 130/80  06/25/18 (!) 164/94 .  Pharmacist Clinical Goal(s): o Over the next 180 days, patient will work with PharmD and providers to achieve BP goal <140/90 . Current regimen:  o HCTZ 25 mg daily,  o irbesartan 300 mg daily  . Interventions: o Discussed BP goals and benefits of medication for prevention of heart attack / stroke . Patient self care activities - Over the next 180 days, patient will: o Check BP 3 times weekly, document, and provide at future appointments o Ensure daily salt intake < 2300 mg/day  Hyperlipidemia Lab Results  Component Value Date/Time   LDLCALC 62 09/10/2019 11:58 AM   LDLDIRECT 86.5 10/26/2006 11:12 AM .  Pharmacist Clinical Goal(s): o Over the next 180 days, patient will work with PharmD and providers to maintain LDL goal < 100 . Current regimen:  o atorvastatin 10 mg daily,  o OTC omega 3 fish oil . Interventions: o Discussed cholesterol goals and benefits of medication for prevention of heart attack / stroke . Patient self care activities - Over the next 180 days, patient will: o Continue medication as prescribed  Prediabetes Lab Results  Component Value Date/Time   HGBA1C 6.3 09/10/2019 11:58 AM   HGBA1C 6.2 03/14/2019 04:33 PM .  Pharmacist Clinical Goal(s): o Over the next 180 days, patient will work with PharmD and providers to maintain A1c goal <6.5% . Current regimen:  o No medications . Interventions: o Discussed blood sugar goals and benefits of diet/exercise for  prevention of diabetes . Patient self care activities - Over the next 180 days, patient will: o Maintain healthy diet high in protein, vegetables   Medication management . Pharmacist Clinical Goal(s): o Over the next 180 days, patient will work with PharmD and providers to maintain optimal medication adherence . [facility] . Interventions o Comprehensive medication review performed. o Continue current medication management strategy . Patient self care activities - Over the next 180 days, patient will: o Focus on medication adherence by fill date o Take medications as prescribed o Report any questions or concerns to PharmD and/or provider(s)  Please see past updates related to this goal by clicking on the "Past Updates" button in the selected goal       Patient verbalizes understanding of instructions provided today.   Telephone follow up appointment with pharmacy team member scheduled for: 6 months  Charlene Brooke, PharmD Clinical Pharmacist [facility] [place] [facility] After Age 50 After age 59, you are at a higher risk for certain long-term diseases and infections as well as injuries from falls. Falls are a major cause of broken bones and head injuries in people who are older than age 72. Getting regular preventive care can help to keep you healthy and well. Preventive care includes getting regular testing and making lifestyle changes as recommended by your health care provider. Talk with your health care provider about:  Which screenings and tests you should have. A screening is a test  that checks for a disease when you have no symptoms.  A diet and exercise plan that is right for you. What should I know about screenings and tests to prevent falls? Screening and testing are the best ways to find a health problem early. Early diagnosis and treatment give you the best chance of managing medical conditions that are common  after age 3. Certain conditions and lifestyle choices may make you more likely to have a fall. Your health care provider may recommend:  Regular vision checks. Poor vision and conditions such as cataracts can make you more likely to have a fall. If you wear glasses, make sure to get your prescription updated if your vision changes.  Medicine review. Work with your health care provider to regularly review all of the medicines you are taking, including over-the-counter medicines. Ask your health care provider about any side effects that may make you more likely to have a fall. Tell your health care provider if any medicines that you take make you feel dizzy or sleepy.  Osteoporosis screening. Osteoporosis is a condition that causes the bones to get weaker. This can make the bones weak and cause them to break more easily.  Blood pressure screening. Blood pressure changes and medicines to control blood pressure can make you feel dizzy.  Strength and balance checks. Your health care provider may recommend certain tests to check your strength and balance while standing, walking, or changing positions.  Foot health exam. Foot pain and numbness, as well as not wearing proper footwear, can make you more likely to have a fall.  Depression screening. You may be more likely to have a fall if you have a fear of falling, feel emotionally low, or feel unable to do activities that you used to do.  Alcohol use screening. Using too much alcohol can affect your balance and may make you more likely to have a fall. What actions can I take to lower my risk of falls? General instructions  Talk with your health care provider about your risks for falling. Tell your health care provider if: ? You fall. Be sure to tell your health care provider about all falls, even ones that seem minor. ? You feel dizzy, sleepy, or off-balance.  Take over-the-counter and prescription medicines only as told by your health care  provider. These include any supplements.  Eat a healthy diet and maintain a healthy weight. A healthy diet includes low-fat dairy products, low-fat (lean) meats, and fiber from whole grains, beans, and lots of fruits and vegetables. Home safety  Remove any tripping hazards, such as rugs, cords, and clutter.  Install safety equipment such as grab bars in bathrooms and safety rails on stairs.  Keep rooms and walkways well-lit. Activity   Follow a regular exercise program to stay fit. This will help you maintain your balance. Ask your health care provider what types of exercise are appropriate for you.  If you need a cane or walker, use it as recommended by your health care provider.  Wear supportive shoes that have nonskid soles. Lifestyle  Do not drink alcohol if your health care provider tells you not to drink.  If you drink alcohol, limit how much you have: ? 0-1 drink a day for women. ? 0-2 drinks a day for men.  Be aware of how much alcohol is in your drink. In the [place] one drink equals one typical bottle of beer (12 oz), one-half glass of wine (5 oz), or one shot of hard  liquor (1 oz).  Do not use any products that contain nicotine or tobacco, such as cigarettes and e-cigarettes. If you need help quitting, ask your health care provider. Summary  Having a healthy lifestyle and getting preventive care can help to protect your health and wellness after age 64.  Screening and testing are the best way to find a health problem early and help you avoid having a fall. Early diagnosis and treatment give you the best chance for managing medical conditions that are more common for people who are older than age 74.  Falls are a major cause of broken bones and head injuries in people who are older than age 41. Take precautions to prevent a fall at home.  Work with your health care provider to learn what changes you can make to improve your health and wellness and to prevent falls. This  information is not intended to replace advice given to you by your health care provider. Make sure you discuss any questions you have with your health care provider. Document Revised: 08/23/2018 Document Reviewed: 03/15/2017 Elsevier Patient Education  2020 [employer].

## 2019-12-02 NOTE — Progress Notes (Signed)
NEUROLOGY CONSULTATION NOTE  Louis Kent MRN: [id number] DOB: [birth year]  Referring provider: Scarlette Calico, MD Primary care provider: Scarlette Calico, MD  Reason for consult:  Word-finding difficulty and forgetfulness   HISTORY OF PRESENT ILLNESS: Louis Kent is an 79 year old right-handed male with HTN and HLD who presents for word-finding difficulty and forgetfulness.  History supplemented by referring provider's note.  He has noticed some word-finding difficulty over past 2-3 years.  It may take a few seconds to recall the word.  He lives alone and is completely independent. He handles all of his finances.  He has a long career in business.  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  He now owns and operates a Product manager.  He doesn't feel as quick when he works but has not made any detrimental actions.  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  He is very social and sits on several boards.  He has no trouble with remembering names and faces.  He does not get lost on familiar routes.  He denies family history of dementia although his mother who passed away at 77 had some cognitive impairment at the end.    Labs from April include B12 548, folate 22.2, TSH 2.46, D 46.67    PAST MEDICAL HISTORY: Past Medical History:  Diagnosis Date  . Hyperlipidemia   . Hypertension   . S/P prostatectomy     PAST SURGICAL HISTORY: Past Surgical History:  Procedure Laterality Date  . ANKLE ARTHROSCOPY W/ OPEN REPAIR    . bilater hip replacements    . PROSTATE SURGERY    . TONSILLECTOMY      MEDICATIONS: Current Outpatient Medications on File Prior to Visit  Medication Sig Dispense Refill  . atorvastatin (LIPITOR) 10 MG tablet TAKE 1 TABLET BY MOUTH EVERY DAY 90 tablet 1  . Calcium Carb-Cholecalciferol (CALCIUM-VITAMIN D3) 600-400 MG-UNIT CAPS Take 1 capsule by mouth daily.    . Cholecalciferol (VITAMIN D3) 50 MCG (2000 UT) TABS TAKE 1  TABLET BY MOUTH EVERY DAY 90 tablet 1  . fluticasone (FLONASE) 50 MCG/ACT nasal spray Place 2 sprays into both nostrils daily. 16 g 6  . hydrochlorothiazide (HYDRODIURIL) 25 MG tablet Take 1 tablet (25 mg total) by mouth daily. 90 tablet 1  . irbesartan (AVAPRO) 300 MG tablet Take 1 tablet (300 mg total) by mouth daily. 90 tablet 1  . Multiple Vitamins-Minerals (MULTIVITAMIN WITH MINERALS) tablet Take by mouth.    . Omega-3 1400 MG CAPS Take by mouth.    . [DISCONTINUED] atorvastatin (LIPITOR) 10 MG tablet Take 1 tablet (10 mg total) by mouth daily. 90 tablet 1  . [DISCONTINUED] Cholecalciferol 50 MCG (2000 UT) TABS Take 1 tablet (2,000 Units total) by mouth daily. 90 tablet 1   No current facility-administered medications on file prior to visit.    ALLERGIES: No Known Allergies  FAMILY HISTORY: Family History  Problem Relation Age of Onset  . Stroke Mother   . Diabetes Mother   . Lung cancer Father        lung cancer  . Lung cancer Unknown     SOCIAL HISTORY: Social History   Socioeconomic History  . Marital status: Widowed    Spouse name: Not on file  . Number of children: 2  . Years of education: Not on file  . Highest education level: Not on file  Occupational History  . Occupation: retired    Fish farm manager: RETIRED  Tobacco Use  . Smoking status: Never Smoker  . Smokeless tobacco: Never Used  Vaping Use  . Vaping Use: Never used  Substance and Sexual Activity  . Alcohol use: No  . Drug use: No  . Sexual activity: Not Currently  Other Topics Concern  . Not on file  Social History Narrative  . Not on file   Social Determinants of Health   Financial Resource Strain: Low Risk   . Difficulty of Paying Living Expenses: Not hard at all  Food Insecurity:   . Worried About Charity fundraiser in the Last Year:   . Arboriculturist in the Last Year:   Transportation Needs:   . Film/video editor (Medical):   Marland Kitchen Lack of Transportation (Non-Medical):   Physical  Activity:   . Days of Exercise per Week:   . Minutes of Exercise per Session:   Stress:   . Feeling of Stress :   Social Connections:   . Frequency of Communication with Friends and Family:   . Frequency of Social Gatherings with Friends and Family:   . Attends Religious Services:   . Active Member of Clubs or Organizations:   . Attends Archivist Meetings:   Marland Kitchen Marital Status:   Intimate Partner Violence:   . Fear of Current or Ex-Partner:   . Emotionally Abused:   Marland Kitchen Physically Abused:   . Sexually Abused:     PHYSICAL EXAM: Blood pressure (!) 166/80, pulse 92, height 5\' 11"  (1.803 m), weight 245 lb (111.1 kg), SpO2 95 %. General: No acute distress.  Patient appears well-groomed.  Head:  Normocephalic/atraumatic Eyes:  fundi examined but not visualized Neck: supple, no paraspinal tenderness, full range of motion Back: No paraspinal tenderness Heart: regular rate and rhythm Lungs: Clear to auscultation bilaterally. Vascular: No carotid bruits. Neurological Exam: Mental status:  [employer] Mental Exam 12/03/2019  Weekday Correct 1  Current year 1  What state are we in? 1  Amount spent 1  Amount left 2  # of Animals 3  5 objects recall 3  Number series 2  Hour markers 0  Time correct 2  Placed X in triangle correctly 1  Largest Figure 1  Name of male 2  Date back to work 2  Type of work 2  State she lived in 0  Total score 24   Cranial nerves: CN I: not tested CN II: pupils equal, round and reactive to light, visual fields intact CN III, IV, VI:  full range of motion, no nystagmus, no ptosis CN V: facial sensation intact CN VII: upper and lower face symmetric CN VIII: hearing intact CN IX, X: gag intact, uvula midline CN XI: sternocleidomastoid and trapezius muscles intact CN XII: tongue midline Bulk & Tone: normal, no fasciculations. Motor:  5/5 throughout  Sensation:  Light touch sensation intact. Deep Tendon Reflexes:  2+ throughout,  toes downgoing.  Finger to nose testing:  Without dysmetria.  Heel to shin:  Without dysmetria.  Gait:  Normal station and stride.  Romberg negative.  IMPRESSION: Word-finding difficulty.  SLUMS score slightly below normal.  However, no definite red flags to suggest neurodegenerative disease.  Word-finding difficulty is a typical memory deficit seen in normal aging.  I suggested formal neuropsychological testing.  He declines at this time but if he notes worsening symptoms such as increased frequency of memory deficits or progression of memory deficits, then he will contact me and we can proceed with neuropsychological testing.  In meantime, recommended to continue socialization, mentally stimulating activities, exercise, Mediterranean diet.  Thank you for allowing me to take part in the care of this patient.  Metta Clines, DO  CC: Scarlette Calico, MD

## 2019-12-03 ENCOUNTER — Ambulatory Visit (INDEPENDENT_AMBULATORY_CARE_PROVIDER_SITE_OTHER): Payer: Medicare Other | Admitting: Neurology

## 2019-12-03 ENCOUNTER — Encounter: Payer: Self-pay | Admitting: Neurology

## 2019-12-03 ENCOUNTER — Other Ambulatory Visit: Payer: Self-pay

## 2019-12-03 VITALS — BP 166/80 | HR 92 | Ht 71.0 in | Wt 245.0 lb

## 2019-12-03 DIAGNOSIS — I6523 Occlusion and stenosis of bilateral carotid arteries: Secondary | ICD-10-CM | POA: Diagnosis not present

## 2019-12-03 DIAGNOSIS — R4789 Other speech disturbances: Secondary | ICD-10-CM

## 2019-12-03 NOTE — Patient Instructions (Signed)
At this time, I do not appreciate any cognitive impairment.  Word-finding difficulty often is part of normal aging.  If symptoms get worse (occurring more frequently or forgetting in that it is impacting your life), then we should evaluate further, such as formal neuropsychological testing.  Contact me if you wish to pursue this.  Otherwise: Routine exercise Mediterranean diet (see below) Continue social interaction Continue mentally stimulating activities Control stroke risk factors such as blood pressure and high cholesterol   Mediterranean Diet A Mediterranean diet refers to food and lifestyle choices that are based on the traditions of countries located on the [employer]. This way of eating has been shown to help prevent certain conditions and improve outcomes for people who have chronic diseases, like kidney disease and heart disease. What are tips for following this plan? Lifestyle  Cook and eat meals together with your family, when possible.  Drink enough fluid to keep your urine clear or pale yellow.  Be physically active every day. This includes: ? Aerobic exercise like running or swimming. ? Leisure activities like gardening, walking, or housework.  Get 7-8 hours of sleep each night.  If recommended by your health care provider, drink red wine in moderation. This means 1 glass a day for nonpregnant women and 2 glasses a day for men. A glass of wine equals 5 oz (150 mL). Reading food labels   Check the serving size of packaged foods. For foods such as rice and pasta, the serving size refers to the amount of cooked product, not dry.  Check the total fat in packaged foods. Avoid foods that have saturated fat or trans fats.  Check the ingredients list for added sugars, such as corn syrup. Shopping  At the grocery store, buy most of your food from the areas near the walls of the store. This includes: ? Fresh fruits and vegetables (produce). ? Grains, beans, nuts, and  seeds. Some of these may be available in unpackaged forms or large amounts (in bulk). ? Fresh seafood. ? Poultry and eggs. ? Low-fat dairy products.  Buy whole ingredients instead of prepackaged foods.  Buy fresh fruits and vegetables in-season from local farmers markets.  Buy frozen fruits and vegetables in resealable bags.  If you do not have access to quality fresh seafood, buy precooked frozen shrimp or canned fish, such as tuna, salmon, or sardines.  Buy small amounts of raw or cooked vegetables, salads, or olives from the deli or salad bar at your store.  Stock your pantry so you always have certain foods on hand, such as olive oil, canned tuna, canned tomatoes, rice, pasta, and beans. Cooking  Cook foods with extra-virgin olive oil instead of using butter or other vegetable oils.  Have meat as a side dish, and have vegetables or grains as your main dish. This means having meat in small portions or adding small amounts of meat to foods like pasta or stew.  Use beans or vegetables instead of meat in common dishes like chili or lasagna.  Experiment with different cooking methods. Try roasting or broiling vegetables instead of steaming or sauteing them.  Add frozen vegetables to soups, stews, pasta, or rice.  Add nuts or seeds for added healthy fat at each meal. You can add these to yogurt, salads, or vegetable dishes.  Marinate fish or vegetables using olive oil, lemon juice, garlic, and fresh herbs. Meal planning   Plan to eat 1 vegetarian meal one day each week. Try to work up to 2  vegetarian meals, if possible.  Eat seafood 2 or more times a week.  Have healthy snacks readily available, such as: ? Vegetable sticks with hummus. ? [place] yogurt. ? Fruit and nut trail mix.  Eat balanced meals throughout the week. This includes: ? Fruit: 2-3 servings a day ? Vegetables: 4-5 servings a day ? Low-fat dairy: 2 servings a day ? Fish, poultry, or lean meat: 1 serving a  day ? Beans and legumes: 2 or more servings a week ? Nuts and seeds: 1-2 servings a day ? Whole grains: 6-8 servings a day ? Extra-virgin olive oil: 3-4 servings a day  Limit red meat and sweets to only a few servings a month What are my food choices?  Mediterranean diet ? Recommended  Grains: Whole-grain pasta. Brown rice. Bulgar wheat. Polenta. Couscous. Whole-wheat bread. [place].  Vegetables: Artichokes. Beets. Broccoli. Cabbage. Carrots. Eggplant. Green beans. Chard. Kale. Spinach. Onions. Leeks. Peas. Squash. Tomatoes. Peppers. Radishes.  Fruits: Apples. Apricots. Avocado. Berries. Bananas. Cherries. Dates. Figs. Grapes. Lemons. Melon. Oranges. Peaches. Plums. Pomegranate.  Meats and other protein foods: Beans. Almonds. Sunflower seeds. Pine nuts. Peanuts. [place]. Salmon. Scallops. Shrimp. [place]. Tilapia. Clams. Oysters. Eggs.  Dairy: Low-fat milk. Cheese. Greek yogurt.  Beverages: Water. Red wine. Herbal tea.  Fats and oils: Extra virgin olive oil. Avocado oil. Grape seed oil.  Sweets and desserts: [place] yogurt with honey. Baked apples. Poached pears. Trail mix.  Seasoning and other foods: Basil. Cilantro. Coriander. Cumin. Mint. Parsley. Sage. Rosemary. Tarragon. Garlic. Oregano. Thyme. Pepper. Balsalmic vinegar. Tahini. Hummus. Tomato sauce. Olives. Mushrooms. ? Limit these  Grains: Prepackaged pasta or rice dishes. Prepackaged cereal with added sugar.  Vegetables: Deep fried potatoes (french fries).  Fruits: Fruit canned in syrup.  Meats and other protein foods: Beef. Pork. Lamb. Poultry with skin. Hot dogs. [place] Salines.  Dairy: Ice cream. Sour cream. Whole milk.  Beverages: Juice. Sugar-sweetened soft drinks. Beer. Liquor and spirits.  Fats and oils: Butter. Canola oil. Vegetable oil. Beef fat (tallow). Lard.  Sweets and desserts: Cookies. Cakes. Pies. Candy.  Seasoning and other foods: Mayonnaise. Premade sauces and marinades. The items listed may not be a  complete list. Talk with your dietitian about what dietary choices are right for you. Summary  The Mediterranean diet includes both food and lifestyle choices.  Eat a variety of fresh fruits and vegetables, beans, nuts, seeds, and whole grains.  Limit the amount of red meat and sweets that you eat.  Talk with your health care provider about whether it is safe for you to drink red wine in moderation. This means 1 glass a day for nonpregnant women and 2 glasses a day for men. A glass of wine equals 5 oz (150 mL). This information is not intended to replace advice given to you by your health care provider. Make sure you discuss any questions you have with your health care provider. Document Revised: 12/31/2015 Document Reviewed: 12/24/2015 Elsevier Patient Education  [place].

## 2020-01-02 DIAGNOSIS — H5213 Myopia, bilateral: Secondary | ICD-10-CM | POA: Diagnosis not present

## 2020-01-02 DIAGNOSIS — H524 Presbyopia: Secondary | ICD-10-CM | POA: Diagnosis not present

## 2020-01-02 DIAGNOSIS — H25813 Combined forms of age-related cataract, bilateral: Secondary | ICD-10-CM | POA: Diagnosis not present

## 2020-01-02 DIAGNOSIS — H52203 Unspecified astigmatism, bilateral: Secondary | ICD-10-CM | POA: Diagnosis not present

## 2020-02-07 DIAGNOSIS — Z23 Encounter for immunization: Secondary | ICD-10-CM | POA: Diagnosis not present

## 2020-02-13 DIAGNOSIS — D2262 Melanocytic nevi of left upper limb, including shoulder: Secondary | ICD-10-CM | POA: Diagnosis not present

## 2020-02-13 DIAGNOSIS — L918 Other hypertrophic disorders of the skin: Secondary | ICD-10-CM | POA: Diagnosis not present

## 2020-02-13 DIAGNOSIS — D485 Neoplasm of uncertain behavior of skin: Secondary | ICD-10-CM | POA: Diagnosis not present

## 2020-02-13 DIAGNOSIS — D0439 Carcinoma in situ of skin of other parts of face: Secondary | ICD-10-CM | POA: Diagnosis not present

## 2020-02-13 DIAGNOSIS — L821 Other seborrheic keratosis: Secondary | ICD-10-CM | POA: Diagnosis not present

## 2020-02-13 DIAGNOSIS — D225 Melanocytic nevi of trunk: Secondary | ICD-10-CM | POA: Diagnosis not present

## 2020-02-13 DIAGNOSIS — L570 Actinic keratosis: Secondary | ICD-10-CM | POA: Diagnosis not present

## 2020-02-17 ENCOUNTER — Telehealth: Payer: Self-pay | Admitting: Pharmacist

## 2020-02-17 NOTE — Progress Notes (Signed)
Chronic Care Management Pharmacy Assistant   Name: Louis Kent  MRN: [id number] DOB: 10/12/1940  Reason for Encounter: Disease State  Patient Questions:  1.  Have you seen any other providers since your last visit? Yes, The patient saw Dr. Lorene Dy Neurology, and Dr. Lisbeth Ply [facility]rio Ophthalomoogy   2.  Any changes in your medicines or health? No   PCP : [name], MD  Allergies:  No Known Allergies  Medications: Outpatient Encounter Medications as of 02/17/2020  Medication Sig   atorvastatin (LIPITOR) 10 MG tablet TAKE 1 TABLET BY MOUTH EVERY DAY   Calcium Carb-Cholecalciferol (CALCIUM-VITAMIN D3) 600-400 MG-UNIT CAPS Take 1 capsule by mouth daily.   Cholecalciferol (VITAMIN D3) 50 MCG (2000 UT) TABS TAKE 1 TABLET BY MOUTH EVERY DAY   fluticasone (FLONASE) 50 MCG/ACT nasal spray Place 2 sprays into both nostrils daily.   hydrochlorothiazide (HYDRODIURIL) 25 MG tablet Take 1 tablet (25 mg total) by mouth daily.   irbesartan (AVAPRO) 300 MG tablet Take 1 tablet (300 mg total) by mouth daily.   Multiple Vitamins-Minerals (MULTIVITAMIN WITH MINERALS) tablet Take by mouth.   Omega-3 1400 MG CAPS Take by mouth.   [DISCONTINUED] atorvastatin (LIPITOR) 10 MG tablet Take 1 tablet (10 mg total) by mouth daily.   [DISCONTINUED] Cholecalciferol 50 MCG (2000 UT) TABS Take 1 tablet (2,000 Units total) by mouth daily.   No facility-administered encounter medications on file as of 02/17/2020.    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) 04/01/2009   Pure hyperglyceridemia 02/07/2007   Essential hypertension 02/07/2007    Goals Addressed   None     Follow-Up:  Pharmacist Review     Reviewed chart prior to disease state call. Spoke with patient regarding BP  Recent Office Vitals: BP Readings from Last 3 Encounters:  12/03/19 (!) 166/80  09/10/19 (!) 162/96  03/14/19 130/80   Pulse Readings from Last 3 Encounters:  12/03/19 92  09/10/19 77  03/14/19 70    Wt Readings from Last 3 Encounters:  12/03/19 245 lb (111.1 kg)  09/10/19 244 lb (110.7 kg)  03/14/19 236 lb (107 kg)     Kidney Function Lab Results  Component Value Date/Time   CREATININE 0.93 09/10/2019 11:58 AM   CREATININE 1.02 03/14/2019 04:33 PM   GFR 77.98 09/10/2019 11:58 AM   GFRNONAA 68.61 02/04/2010 11:20 AM   GFRAA 95 09/18/2007 11:38 AM    BMP Latest Ref Rng & Units 09/10/2019 03/14/2019 06/25/2018  Glucose 70 - 99 mg/dL 74 95 85  BUN 6 - 23 mg/dL 17 24(H) 14  Creatinine 0.40 - 1.50 mg/dL 0.93 1.02 0.93  Sodium 135 - 145 mEq/L 137 135 139  Potassium 3.5 - 5.1 mEq/L 4.1 4.3 4.1  Chloride 96 - 112 mEq/L 101 98 101  CO2 19 - 32 mEq/L 31 30 28   Calcium 8.4 - 10.5 mg/dL 9.0 9.3 9.6     Current antihypertensive regimen:  o Hydrochlorothiazide 25mg , Irbesartan 300mg   How often are you checking your Blood Pressure? daily  Current home BP readings: 141/80 on 02/16/2020  What recent interventions/DTPs have been made by any provider to improve Blood Pressure control since last CPP Visit: No   Any recent hospitalizations or ED visits since last visit with CPP? No  What diet changes have been made to improve Blood Pressure Control?  o The patient stated that he has been trying to watch his food portions while eating   What exercise is being done to improve your Blood Pressure Control?  o Walking    Adherence Review: Is the patient currently on ACE/ARB medication? Yes Does the patient have >5 day gap between last estimated fill dates? No    Rosendo Gros, [facility]  Practice Team Manager/ CPA (Clinical Pharmacist Assistant) [phone]

## 2020-02-19 ENCOUNTER — Other Ambulatory Visit: Payer: Self-pay | Admitting: Internal Medicine

## 2020-02-19 DIAGNOSIS — E559 Vitamin D deficiency, unspecified: Secondary | ICD-10-CM

## 2020-03-09 ENCOUNTER — Other Ambulatory Visit: Payer: Self-pay | Admitting: Internal Medicine

## 2020-03-09 DIAGNOSIS — I10 Essential (primary) hypertension: Secondary | ICD-10-CM

## 2020-03-09 DIAGNOSIS — I6523 Occlusion and stenosis of bilateral carotid arteries: Secondary | ICD-10-CM

## 2020-03-11 DIAGNOSIS — C44329 Squamous cell carcinoma of skin of other parts of face: Secondary | ICD-10-CM | POA: Diagnosis not present

## 2020-03-11 DIAGNOSIS — Z85828 Personal history of other malignant neoplasm of skin: Secondary | ICD-10-CM | POA: Diagnosis not present

## 2020-03-18 DIAGNOSIS — L570 Actinic keratosis: Secondary | ICD-10-CM | POA: Diagnosis not present

## 2020-03-22 ENCOUNTER — Other Ambulatory Visit: Payer: Self-pay | Admitting: Internal Medicine

## 2020-03-22 DIAGNOSIS — I6523 Occlusion and stenosis of bilateral carotid arteries: Secondary | ICD-10-CM

## 2020-03-22 DIAGNOSIS — E785 Hyperlipidemia, unspecified: Secondary | ICD-10-CM

## 2020-05-21 ENCOUNTER — Other Ambulatory Visit: Payer: Self-pay

## 2020-05-21 ENCOUNTER — Ambulatory Visit: Payer: Medicare Other | Admitting: Pharmacist

## 2020-05-21 DIAGNOSIS — E785 Hyperlipidemia, unspecified: Secondary | ICD-10-CM

## 2020-05-21 DIAGNOSIS — I10 Essential (primary) hypertension: Secondary | ICD-10-CM

## 2020-05-21 NOTE — Chronic Care Management (AMB) (Signed)
Chronic Care Management Pharmacy  Name: Louis Kent  MRN: [id number] DOB: 1941-04-01   Chief Complaint/ HPI  Louis Kent,  80 y.o. , male presents for their Follow-Up CCM visit with the clinical pharmacist via telephone.  PCP : [name], MD Patient Care Team: [name], MD as PCP - General (Internal Medicine) Charlton Haws, [facility] as Pharmacist (Pharmacist)  Their chronic conditions include: HTN, HLD, prediabetes, carotid atherosclerosis  Office Visits:  09/10/19 Dr Ronnald Ramp OV: pt has developed word finding difficulty and forgetfulness. Pt ran out of BP meds a few days prior, BP elevated in clinic 162/96. Refilled irbesartan and HCTZ. Referred to neuro for cognitive testing.  08/23/19 NP Marvis Repress OV: acute sinusitis, rx'd Augmentin and Flonase.   06/25/18 Dr Ronnald Ramp OV: BP elevated, increased HCTZ to 25 mg daily, continue irbesartan 300 mg daily  03/14/19 Dr Ronnald Ramp OV: stop B12 due to level too high. Given Shingrix. No other med changes.  Consult Visit:  12/03/19 Dr Tomi Likens (neurology): evaluation for word-finding difficulty. Thought to be normal aging, nothing to suggest neurodegenerative disease. Pt declined formal neuropsychological testing.  5/00/37 Dr Erik Obey, [facility] ENT: cerumen removal. Rec'd hearing aids bilaterally.   12/13/18 Dr Gershon Crane (ophthalmology): cataract check.  No Known Allergies  Medications: Outpatient Encounter Medications as of 05/21/2020  Medication Sig  . atorvastatin (LIPITOR) 10 MG tablet TAKE 1 TABLET BY MOUTH EVERY DAY  . Calcium Carb-Cholecalciferol (CALCIUM-VITAMIN D3) 600-400 MG-UNIT CAPS Take 1 capsule by mouth daily.  . Cholecalciferol (VITAMIN D3) 50 MCG (2000 UT) TABS TAKE 1 TABLET BY MOUTH EVERY DAY  . fluticasone (FLONASE) 50 MCG/ACT nasal spray Place 2 sprays into both nostrils daily.  . hydrochlorothiazide (HYDRODIURIL) 25 MG tablet TAKE 1 TABLET BY MOUTH EVERY DAY  . irbesartan (AVAPRO) 300 MG tablet TAKE 1 TABLET BY  MOUTH EVERY DAY  . Multiple Vitamins-Minerals (MULTIVITAMIN WITH MINERALS) tablet Take by mouth.  . Omega-3 1400 MG CAPS Take by mouth.   No facility-administered encounter medications on file as of 05/21/2020.   Wt Readings from Last 3 Encounters:  12/03/19 245 lb (111.1 kg)  09/10/19 244 lb (110.7 kg)  03/14/19 236 lb (107 kg)   Lab Results  Component Value Date   CREATININE 0.93 09/10/2019   BUN 17 09/10/2019   GFR 77.98 09/10/2019   GFRNONAA 68.61 02/04/2010   GFRAA 95 09/18/2007   NA 137 09/10/2019   K 4.1 09/10/2019   CALCIUM 9.0 09/10/2019   CO2 31 09/10/2019    Current Diagnosis/Assessment:   Goals Addressed            This Visit's Progress   . Pharmacy Care Plan       CARE PLAN ENTRY (see longitudinal plan of care for additional care plan information)  Current Barriers:  . Chronic Disease Management support, education, and care coordination needs related to Hypertension and Hyperlipidemia   Hypertension BP Readings from Last 3 Encounters:  12/03/19 (!) 166/80  09/10/19 (!) 162/96  03/14/19 130/80 .  Pharmacist Clinical Goal(s): o Over the next 180 days, patient will work with PharmD and providers to achieve BP goal <140/90 . Current regimen:  o HCTZ 25 mg daily,  o irbesartan 300 mg daily  . Interventions: o Discussed BP goals and benefits of medication for prevention of heart attack / stroke . Patient self care activities - Over the next 180 days, patient will: o Check BP 3 times weekly, document, and provide at future appointments o Ensure  daily salt intake < 2300 mg/day  Hyperlipidemia Lab Results  Component Value Date/Time   LDLCALC 62 09/10/2019 11:58 AM   LDLDIRECT 86.5 10/26/2006 11:12 AM .  Pharmacist Clinical Goal(s): o Over the next 180 days, patient will work with PharmD and providers to maintain LDL goal < 100 . Current regimen:  o atorvastatin 10 mg daily,  o OTC omega 3 fish oil . Interventions: o Discussed cholesterol goals and  benefits of medication for prevention of heart attack / stroke . Patient self care activities - Over the next 180 days, patient will: o Continue medication as prescribed  Medication management . Pharmacist Clinical Goal(s): o Over the next 180 days, patient will work with PharmD and providers to maintain optimal medication adherence . Current [facility] . Interventions o Comprehensive medication review performed. o Continue current medication management strategy . Patient self care activities - Over the next 180 days, patient will: o Focus on medication adherence by fill date o Take medications as prescribed o Report any questions or concerns to PharmD and/or provider(s)  Please see past updates related to this goal by clicking on the "Past Updates" button in the selected goal         Hypertension   BP goal < 140/90  Office blood pressures are  BP Readings from Last 3 Encounters:  12/03/19 (!) 166/80  09/10/19 (!) 162/96  03/14/19 130/80   Patient checks BP at home 1-2x per week Patient home BP readings are ranging: 140s/80s  Patient is currently controlled on the following medications:   HCTZ 25 mg daily,   irbesartan 300 mg daily   We discussed: - pt reports home BP has not changed much, he remains compliant with medications  Plan  Continue current medications and control with diet and exercise   Hyperlipidemia   LDL goal < 100 Hx carotid atherosclerosis  Last lipids Lab Results  Component Value Date   CHOL 155 09/10/2019   HDL 57.40 09/10/2019   LDLCALC 62 09/10/2019   LDLDIRECT 86.5 10/26/2006   TRIG 176.0 (H) 09/10/2019   CHOLHDL 3 09/10/2019   Hepatic Function Latest Ref Rng & Units 06/25/2018 10/25/2016 10/03/2013  Total Protein 6.0 - 8.3 g/dL 6.9 6.8 6.9  Albumin 3.5 - 5.2 g/dL 4.4 4.2 4.0  AST 0 - 37 U/L 25 21 35  ALT 0 - 53 U/L '29 29 31  ' Alk Phosphatase 39 - 117 U/L 81 91 48  Total Bilirubin 0.2 - 1.2 mg/dL 0.7 0.6 0.6  Bilirubin, Direct  0.0 - 0.3 mg/dL - - 0.0   The ASCVD Risk score ([place]., et al., 2013) failed to calculate for the following reasons:   The 2013 ASCVD risk score is only valid for ages 30 to 1  Patient has failed these meds in past: fenofibrate Patient is currently controlled on the following medications:   atorvastatin 10 mg daily,   OTC omega 3 fish oil  We discussed:  - cholesterol is at goal -pt stopped aspirin last year due to risks > benefits at his age/health status  Plan  Continue current medications   Medication Management   [facility] Uses pill box? No - prefers bottles Pt endorses 100% compliance  We discussed: [facility]; pt is satisfied with their services. -advised pt to make f/u appt with PCP over next 3 months   Plan  Continue current medication management strategy    Follow up: 6  month phone visit  Charlene Brooke, PharmD, BCACP Clinical Pharmacist [facility] [phone]

## 2020-05-21 NOTE — Patient Instructions (Signed)
Visit Information  Phone number for Pharmacist: [phone]  Goals Addressed            This Visit's Progress   . Pharmacy Care Plan       CARE PLAN ENTRY (see longitudinal plan of care for additional care plan information)  Current Barriers:  . Chronic Disease Management support, education, and care coordination needs related to Hypertension and Hyperlipidemia   Hypertension BP Readings from Last 3 Encounters:  12/03/19 (!) 166/80  09/10/19 (!) 162/96  03/14/19 130/80 .  Pharmacist Clinical Goal(s): o Over the next 180 days, patient will work with PharmD and providers to achieve BP goal <140/90 . Current regimen:  o HCTZ 25 mg daily,  o irbesartan 300 mg daily  . Interventions: o Discussed BP goals and benefits of medication for prevention of heart attack / stroke . Patient self care activities - Over the next 180 days, patient will: o Check BP 3 times weekly, document, and provide at future appointments o Ensure daily salt intake < 2300 mg/day  Hyperlipidemia Lab Results  Component Value Date/Time   LDLCALC 62 09/10/2019 11:58 AM   LDLDIRECT 86.5 10/26/2006 11:12 AM .  Pharmacist Clinical Goal(s): o Over the next 180 days, patient will work with PharmD and providers to maintain LDL goal < 100 . Current regimen:  o atorvastatin 10 mg daily,  o OTC omega 3 fish oil . Interventions: o Discussed cholesterol goals and benefits of medication for prevention of heart attack / stroke . Patient self care activities - Over the next 180 days, patient will: o Continue medication as prescribed  Medication management . Pharmacist Clinical Goal(s): o Over the next 180 days, patient will work with PharmD and providers to maintain optimal medication adherence . [facility] . Interventions o Comprehensive medication review performed. o Continue current medication management strategy . Patient self care activities - Over the next 180 days, patient will: o Focus on  medication adherence by fill date o Take medications as prescribed o Report any questions or concerns to PharmD and/or provider(s)  Please see past updates related to this goal by clicking on the "Past Updates" button in the selected goal       The patient verbalized understanding of instructions, educational materials, and care plan provided today and declined offer to receive copy of patient instructions, educational materials, and care plan.  Telephone follow up appointment with pharmacy team member scheduled for: 6 months  Al Corpus, PharmD, [facility] Clinical Pharmacist  Primary Care at [facility] [phone]

## 2020-06-15 ENCOUNTER — Telehealth: Payer: Self-pay | Admitting: Pharmacist

## 2020-06-15 NOTE — Progress Notes (Signed)
° ° °  Chronic Care Management Pharmacy Assistant   Name: Louis Kent  MRN: [id number] DOB: 07-12-1940  Reason for Encounter: Chart Review   PCP : [name], MD  Allergies:  No Known Allergies  Medications: Outpatient Encounter Medications as of 06/15/2020  Medication Sig   atorvastatin (LIPITOR) 10 MG tablet TAKE 1 TABLET BY MOUTH EVERY DAY   Calcium Carb-Cholecalciferol (CALCIUM-VITAMIN D3) 600-400 MG-UNIT CAPS Take 1 capsule by mouth daily.   Cholecalciferol (VITAMIN D3) 50 MCG (2000 UT) TABS TAKE 1 TABLET BY MOUTH EVERY DAY   fluticasone (FLONASE) 50 MCG/ACT nasal spray Place 2 sprays into both nostrils daily.   hydrochlorothiazide (HYDRODIURIL) 25 MG tablet TAKE 1 TABLET BY MOUTH EVERY DAY   irbesartan (AVAPRO) 300 MG tablet TAKE 1 TABLET BY MOUTH EVERY DAY   Multiple Vitamins-Minerals (MULTIVITAMIN WITH MINERALS) tablet Take by mouth.   Omega-3 1400 MG CAPS Take by mouth.   No facility-administered encounter medications on file as of 06/15/2020.    Current Diagnosis: Patient Active Problem List   Diagnosis Date Noted   Word finding difficulty 09/10/2019   Routine general medical examination at a health care facility 06/26/2018   Mixed conductive and sensorineural hearing loss of both ears 06/25/2018   Vitamin D deficiency 09/17/2015   Carotid atherosclerosis 01/05/2015   Hyperlipidemia with target LDL less than 130 09/03/2014   Symptomatic cholelithiasis 12/30/2013   Ventral hernia 12/30/2013   Prediabetes 12/12/2013   Osteoarthritis of hip 03/03/2011   B12 nutritional deficiency 09/16/2009   Morbid obesity ([place]) 04/01/2009   Pure hyperglyceridemia 02/07/2007   Essential hypertension 02/07/2007    Goals Addressed   None     Follow-Up:  Pharmacist Review   Reviewed chart for medication changes and adherence.    No gaps in adherence identified. Patient has follow up scheduled with pharmacy team. No further action  required.   Wendy Poet, Clinical Pharmacist [facility] [phone]

## 2020-08-06 ENCOUNTER — Telehealth: Payer: Self-pay | Admitting: Pharmacist

## 2020-08-06 NOTE — Progress Notes (Signed)
Chronic Care Management Pharmacy Assistant   Name: Louis Kent  MRN: [id number] DOB: [birth year]   Reason for Encounter: Hypertension Disease State Call    Recent office visits:  None ID  Recent consult visits:  None ID  Hospital visits:  None in previous 6 months  Medications: Outpatient Encounter Medications as of 08/06/2020  Medication Sig  . atorvastatin (LIPITOR) 10 MG tablet TAKE 1 TABLET BY MOUTH EVERY DAY  . Calcium Carb-Cholecalciferol (CALCIUM-VITAMIN D3) 600-400 MG-UNIT CAPS Take 1 capsule by mouth daily.  . Cholecalciferol (VITAMIN D3) 50 MCG (2000 UT) TABS TAKE 1 TABLET BY MOUTH EVERY DAY  . fluticasone (FLONASE) 50 MCG/ACT nasal spray Place 2 sprays into both nostrils daily.  . hydrochlorothiazide (HYDRODIURIL) 25 MG tablet TAKE 1 TABLET BY MOUTH EVERY DAY  . irbesartan (AVAPRO) 300 MG tablet TAKE 1 TABLET BY MOUTH EVERY DAY  . Multiple Vitamins-Minerals (MULTIVITAMIN WITH MINERALS) tablet Take by mouth.  . Omega-3 1400 MG CAPS Take by mouth.   No facility-administered encounter medications on file as of 08/06/2020.    Pharmacist Review  Reviewed chart prior to disease state call. Spoke with patient regarding BP  Recent Office Vitals: BP Readings from Last 3 Encounters:  12/03/19 (!) 166/80  09/10/19 (!) 162/96  03/14/19 130/80   Pulse Readings from Last 3 Encounters:  12/03/19 92  09/10/19 77  03/14/19 70    Wt Readings from Last 3 Encounters:  12/03/19 245 lb (111.1 kg)  09/10/19 244 lb (110.7 kg)  03/14/19 236 lb (107 kg)     Kidney Function Lab Results  Component Value Date/Time   CREATININE 0.93 09/10/2019 11:58 AM   CREATININE 1.02 03/14/2019 04:33 PM   GFR 77.98 09/10/2019 11:58 AM   GFRNONAA 68.61 02/04/2010 11:20 AM   GFRAA 95 09/18/2007 11:38 AM    BMP Latest Ref Rng & Units 09/10/2019 03/14/2019 06/25/2018  Glucose 70 - 99 mg/dL 74 95 85  BUN 6 - 23 mg/dL 17 24(H) 14  Creatinine 0.40 - 1.50 mg/dL 0.93 1.02 0.93  Sodium  135 - 145 mEq/L 137 135 139  Potassium 3.5 - 5.1 mEq/L 4.1 4.3 4.1  Chloride 96 - 112 mEq/L 101 98 101  CO2 19 - 32 mEq/L 31 30 28   Calcium 8.4 - 10.5 mg/dL 9.0 9.3 9.6    . Current antihypertensive regimen:  The patient is taking Irbesartan 300 mg daily, Hydrochlorothiazide 25 mg daily  . How often are you checking your Blood Pressure? The patient states that he does not check blood pressure everyday but it has been up and down  . Current home BP readings: The patient did not have any blood readings but states that his blood pressure runs between 140/80 to 150 /80  . What recent interventions/DTPs have been made by any provider to improve Blood Pressure control since last CPP Visit: The patient states that he should be taking blood pressure at least 3 times a day but has not kept up with it  . Any recent hospitalizations or ED visits since last visit with CPP? The patient has not had any hospital or ED visits  . What diet changes have been made to improve Blood Pressure Control?  The patient states that he does watch his salt intake, but not much has changed. He does have some swelling in ankles  . What exercise is being done to improve your Blood Pressure Control?  The patient states that he does walk occassionally  Adherence Review: Is  the patient currently on ACE/ARB medication? Yes, Irbesartan Does the patient have >5 day gap between last estimated fill dates? No, last fill for irbesartan was on 06/04/20   [place] [phone]   Time spent:20 minutes-telephone call, review chart notes, and documentation

## 2020-08-14 ENCOUNTER — Other Ambulatory Visit: Payer: Self-pay | Admitting: Internal Medicine

## 2020-08-14 DIAGNOSIS — E559 Vitamin D deficiency, unspecified: Secondary | ICD-10-CM

## 2020-08-24 ENCOUNTER — Telehealth (INDEPENDENT_AMBULATORY_CARE_PROVIDER_SITE_OTHER): Payer: Medicare Other | Admitting: Family

## 2020-08-24 DIAGNOSIS — J069 Acute upper respiratory infection, unspecified: Secondary | ICD-10-CM

## 2020-08-24 MED ORDER — AMOXICILLIN-POT CLAVULANATE 875-125 MG PO TABS: 1.0000 | ORAL_TABLET | Freq: Two times a day (BID) | ORAL | 0 refills | Status: AC

## 2020-08-24 NOTE — Progress Notes (Signed)
Louis Kent is a 80 y.o. male with the following history as recorded in EpicCare:  Patient Active Problem List   Diagnosis Date Noted  . Word finding difficulty 09/10/2019  . Routine general medical examination at a health care facility 06/26/2018  . Mixed conductive and sensorineural hearing loss of both ears 06/25/2018  . Vitamin D deficiency 09/17/2015  . Carotid atherosclerosis 01/05/2015  . Hyperlipidemia with target LDL less than 130 09/03/2014  . Symptomatic cholelithiasis 12/30/2013  . Ventral hernia 12/30/2013  . Prediabetes 12/12/2013  . Osteoarthritis of hip 03/03/2011  . B12 nutritional deficiency 09/16/2009  . Morbid obesity ([place]) 04/01/2009  . Pure hyperglyceridemia 02/07/2007  . Essential hypertension 02/07/2007    Current Outpatient Medications  Medication Sig Dispense Refill  . amoxicillin-clavulanate (AUGMENTIN) 875-125 MG tablet Take 1 tablet by mouth 2 (two) times daily for 10 days. 20 tablet 0  . atorvastatin (LIPITOR) 10 MG tablet TAKE 1 TABLET BY MOUTH EVERY DAY 90 tablet 1  . Calcium Carb-Cholecalciferol (CALCIUM-VITAMIN D3) 600-400 MG-UNIT CAPS Take 1 capsule by mouth daily.    . Cholecalciferol (VITAMIN D3) 50 MCG (2000 UT) TABS TAKE 1 TABLET BY MOUTH EVERY DAY 90 tablet 1  . fluticasone (FLONASE) 50 MCG/ACT nasal spray Place 2 sprays into both nostrils daily. 16 g 6  . hydrochlorothiazide (HYDRODIURIL) 25 MG tablet TAKE 1 TABLET BY MOUTH EVERY DAY 90 tablet 1  . irbesartan (AVAPRO) 300 MG tablet TAKE 1 TABLET BY MOUTH EVERY DAY 90 tablet 1  . Multiple Vitamins-Minerals (MULTIVITAMIN WITH MINERALS) tablet Take by mouth.    . Omega-3 1400 MG CAPS Take by mouth.     No current facility-administered medications for this visit.    Allergies: Patient has no known allergies.  Past Medical History:  Diagnosis Date  . Hyperlipidemia   . Hypertension   . S/P prostatectomy     Past Surgical History:  Procedure Laterality Date  . ANKLE ARTHROSCOPY W/ OPEN  REPAIR    . bilater hip replacements    . PROSTATE SURGERY    . TONSILLECTOMY      Family History  Problem Relation Age of Onset  . Stroke Mother   . Diabetes Mother   . Lung cancer Father        lung cancer  . Lung cancer Other     Social History   Tobacco Use  . Smoking status: Never Smoker  . Smokeless tobacco: Never Used  Substance Use Topics  . Alcohol use: No    Subjective:   I connected with Louis Kent on 08/24/20 at  1:20 PM EDT by a video enabled telemedicine application and verified that I am speaking with the correct person using two identifiers.   I discussed the limitations of evaluation and management by telemedicine and the availability of in person appointments. The patient expressed understanding and agreed to proceed. Provider in office/ patient is at home; provider and patient are only 2 people on video call.   4-5 day history of cough/ cold symptoms- concerned that has moved into his chest; home COVID test negative; low grade fever; using OTC Tylenol, nasal spray; concerned that these symptoms are "different" than his normal cold or allergy symptoms; would be interested in send out PCR covid testing; no chest pain or shortness of breath;   Objective:  There were no vitals filed for this visit.  General: Well developed, well nourished, in no acute distress  Skin : Warm and dry.  Head: Normocephalic and  atraumatic  Lungs: Respirations unlabored;  Neurologic: Alert and oriented; speech intact; face symmetrical;   Assessment:  1. Viral URI with cough     Plan:   Discussed need to test for COVID and go ahead and treat for possible bacterial infection; he is comfortable with this treatment plan; Rx for Augmentin 875 mg bid x 10 days; increase fluids, rest and follow up to be determined.    No follow-ups on file.  Orders Placed This Encounter  Procedures  . Novel Coronavirus, NAA ([facility])    Order Specific Question:   Is this test for diagnosis or  screening    Answer:   Diagnosis of ill patient    Order Specific Question:   Symptomatic for COVID-19 as defined by CDC    Answer:   Yes    Order Specific Question:   Date of Symptom Onset    Answer:   08/19/2020    Order Specific Question:   Hospitalized for COVID-19    Answer:   No    Order Specific Question:   Admitted to ICU for COVID-19    Answer:   No    Order Specific Question:   Previously tested for COVID-19    Answer:   No    Order Specific Question:   Resident in a congregate (group) care setting    Answer:   No    Order Specific Question:   Is the patient student?    Answer:   No    Order Specific Question:   Employed in healthcare setting    Answer:   No    Order Specific Question:   Has patient completed COVID vaccination(s) (2 doses of Pfizer/Moderna 1 dose of [employer])    Answer:   Yes    Order Specific Question:   Has patient completed COVID Booster / 3rd dose    Answer:   Yes    Requested Prescriptions   Signed Prescriptions Disp Refills  . amoxicillin-clavulanate (AUGMENTIN) 875-125 MG tablet 20 tablet 0    Sig: Take 1 tablet by mouth 2 (two) times daily for 10 days.

## 2020-08-25 LAB — NOVEL CORONAVIRUS, NAA: SARS-CoV-2, NAA: NOT DETECTED

## 2020-08-25 LAB — SARS-COV-2, NAA 2 DAY TAT

## 2020-08-31 ENCOUNTER — Other Ambulatory Visit: Payer: Self-pay | Admitting: Internal Medicine

## 2020-08-31 DIAGNOSIS — I10 Essential (primary) hypertension: Secondary | ICD-10-CM

## 2020-08-31 DIAGNOSIS — I6523 Occlusion and stenosis of bilateral carotid arteries: Secondary | ICD-10-CM

## 2020-09-07 ENCOUNTER — Other Ambulatory Visit: Payer: Self-pay

## 2020-09-08 ENCOUNTER — Encounter: Payer: Self-pay | Admitting: Internal Medicine

## 2020-09-08 ENCOUNTER — Ambulatory Visit (INDEPENDENT_AMBULATORY_CARE_PROVIDER_SITE_OTHER): Payer: Medicare Other | Admitting: Internal Medicine

## 2020-09-08 VITALS — BP 130/74 | HR 75 | Temp 98.5°F | Ht 71.0 in | Wt 241.8 lb

## 2020-09-08 DIAGNOSIS — E559 Vitamin D deficiency, unspecified: Secondary | ICD-10-CM | POA: Diagnosis not present

## 2020-09-08 DIAGNOSIS — Z9079 Acquired absence of other genital organ(s): Secondary | ICD-10-CM | POA: Insufficient documentation

## 2020-09-08 DIAGNOSIS — I10 Essential (primary) hypertension: Secondary | ICD-10-CM

## 2020-09-08 DIAGNOSIS — Z23 Encounter for immunization: Secondary | ICD-10-CM | POA: Diagnosis not present

## 2020-09-08 DIAGNOSIS — N401 Enlarged prostate with lower urinary tract symptoms: Secondary | ICD-10-CM | POA: Diagnosis not present

## 2020-09-08 DIAGNOSIS — H906 Mixed conductive and sensorineural hearing loss, bilateral: Secondary | ICD-10-CM

## 2020-09-08 DIAGNOSIS — H6123 Impacted cerumen, bilateral: Secondary | ICD-10-CM

## 2020-09-08 DIAGNOSIS — E785 Hyperlipidemia, unspecified: Secondary | ICD-10-CM

## 2020-09-08 DIAGNOSIS — E538 Deficiency of other specified B group vitamins: Secondary | ICD-10-CM | POA: Diagnosis not present

## 2020-09-08 DIAGNOSIS — I6523 Occlusion and stenosis of bilateral carotid arteries: Secondary | ICD-10-CM | POA: Diagnosis not present

## 2020-09-08 DIAGNOSIS — R351 Nocturia: Secondary | ICD-10-CM

## 2020-09-08 DIAGNOSIS — R7303 Prediabetes: Secondary | ICD-10-CM

## 2020-09-08 DIAGNOSIS — E118 Type 2 diabetes mellitus with unspecified complications: Secondary | ICD-10-CM | POA: Diagnosis not present

## 2020-09-08 DIAGNOSIS — E781 Pure hyperglyceridemia: Secondary | ICD-10-CM

## 2020-09-08 LAB — CBC WITH DIFFERENTIAL/PLATELET
Basophils Absolute: 0 10*3/uL (ref 0.0–0.1)
Basophils Relative: 0.6 % (ref 0.0–3.0)
Eosinophils Absolute: 0.2 10*3/uL (ref 0.0–0.7)
Eosinophils Relative: 3.4 % (ref 0.0–5.0)
HCT: 41.2 % (ref 39.0–52.0)
Hemoglobin: 13.8 g/dL (ref 13.0–17.0)
Lymphocytes Relative: 21 % (ref 12.0–46.0)
Lymphs Abs: 1.5 10*3/uL (ref 0.7–4.0)
MCHC: 33.4 g/dL (ref 30.0–36.0)
MCV: 92.2 fl (ref 78.0–100.0)
Monocytes Absolute: 0.6 10*3/uL (ref 0.1–1.0)
Monocytes Relative: 9.1 % (ref 3.0–12.0)
Neutro Abs: 4.6 10*3/uL (ref 1.4–7.7)
Neutrophils Relative %: 65.9 % (ref 43.0–77.0)
Platelets: 206 10*3/uL (ref 150.0–400.0)
RBC: 4.46 Mil/uL (ref 4.22–5.81)
RDW: 13.5 % (ref 11.5–15.5)
WBC: 6.9 10*3/uL (ref 4.0–10.5)

## 2020-09-08 LAB — HEPATIC FUNCTION PANEL
ALT: 25 U/L (ref 0–53)
AST: 26 U/L (ref 0–37)
Albumin: 4 g/dL (ref 3.5–5.2)
Alkaline Phosphatase: 79 U/L (ref 39–117)
Bilirubin, Direct: 0.1 mg/dL (ref 0.0–0.3)
Total Bilirubin: 0.8 mg/dL (ref 0.2–1.2)
Total Protein: 6.5 g/dL (ref 6.0–8.3)

## 2020-09-08 LAB — LIPID PANEL
Cholesterol: 154 mg/dL (ref 0–200)
HDL: 62.4 mg/dL (ref >39.00)
NonHDL: 91.54
Total CHOL/HDL Ratio: 2
Triglycerides: 242 mg/dL — ABNORMAL HIGH (ref 0.0–149.0)
VLDL: 48.4 mg/dL — ABNORMAL HIGH (ref 0.0–40.0)

## 2020-09-08 LAB — BASIC METABOLIC PANEL
BUN: 18 mg/dL (ref 6–23)
CO2: 32 mEq/L (ref 19–32)
Calcium: 9.3 mg/dL (ref 8.4–10.5)
Chloride: 103 mEq/L (ref 96–112)
Creatinine, Ser: 1.01 mg/dL (ref 0.40–1.50)
GFR: 69.51 mL/min (ref >60.00)
Glucose, Bld: 96 mg/dL (ref 70–99)
Potassium: 4.4 mEq/L (ref 3.5–5.1)
Sodium: 140 mEq/L (ref 135–145)

## 2020-09-08 LAB — VITAMIN D 25 HYDROXY (VIT D DEFICIENCY, FRACTURES): VITD: 40.03 ng/mL (ref 30.00–100.00)

## 2020-09-08 LAB — FOLATE: Folate: >23.6 ng/mL (ref >5.9)

## 2020-09-08 LAB — TSH: TSH: 1.87 u[IU]/mL (ref 0.35–4.50)

## 2020-09-08 LAB — PSA: PSA: 0.01 ng/mL — ABNORMAL LOW (ref 0.10–4.00)

## 2020-09-08 LAB — LDL CHOLESTEROL, DIRECT: Direct LDL: 65 mg/dL

## 2020-09-08 LAB — HEMOGLOBIN A1C: Hgb A1c MFr Bld: 6.5 % (ref 4.6–6.5)

## 2020-09-08 NOTE — Patient Instructions (Signed)

## 2020-09-08 NOTE — Progress Notes (Signed)
Subjective:  Patient ID: Louis Kent, male    DOB: 10-Apr-1941  Age: 80 y.o. MRN: [id number]  CC: Hypertension and Hyperlipidemia  This visit occurred during the SARS-CoV-2 public health emergency.  Safety protocols were in place, including screening questions prior to the visit, additional usage of staff PPE, and extensive cleaning of exam room while observing appropriate contact time as indicated for disinfecting solutions.    HPI Louis Kent presents for f/up -   He wants to have his PSA checked.  He complains of bilateral decreased LOH over the last few months.  He is active and denies any recent episodes of CP, SOB, palpitations, edema, or fatigue.  Outpatient Medications Prior to Visit  Medication Sig Dispense Refill  . Calcium Carb-Cholecalciferol (CALCIUM-VITAMIN D3) 600-400 MG-UNIT CAPS Take 1 capsule by mouth daily.    . Cholecalciferol (VITAMIN D3) 50 MCG (2000 UT) TABS TAKE 1 TABLET BY MOUTH EVERY DAY 90 tablet 1  . hydrochlorothiazide (HYDRODIURIL) 25 MG tablet TAKE 1 TABLET BY MOUTH EVERY DAY 90 tablet 1  . irbesartan (AVAPRO) 300 MG tablet TAKE 1 TABLET BY MOUTH EVERY DAY 90 tablet 1  . Multiple Vitamins-Minerals (MULTIVITAMIN WITH MINERALS) tablet Take by mouth.    . Omega-3 1400 MG CAPS Take by mouth.    Marland Kitchen atorvastatin (LIPITOR) 10 MG tablet TAKE 1 TABLET BY MOUTH EVERY DAY 90 tablet 1  . fluticasone (FLONASE) 50 MCG/ACT nasal spray Place 2 sprays into both nostrils daily. (Patient not taking: Reported on 09/08/2020) 16 g 6   No facility-administered medications prior to visit.    ROS Review of Systems  Objective:  BP 130/74 (BP Location: Right Arm, Patient Position: Sitting, Cuff Size: Large)   Pulse 75   Temp 98.5 F (36.9 C) (Oral)   Ht '5\' 11"'  (1.803 m)   Wt 241 lb 12.8 oz (109.7 kg)   SpO2 97%   BMI 33.72 kg/m   BP Readings from Last 3 Encounters:  09/08/20 130/74  12/03/19 (!) 166/80  09/10/19 (!) 162/96    Wt Readings from Last 3  Encounters:  09/08/20 241 lb 12.8 oz (109.7 kg)  12/03/19 245 lb (111.1 kg)  09/10/19 244 lb (110.7 kg)    Physical Exam Vitals reviewed.  Constitutional:      Appearance: Normal appearance.  HENT:     Right Ear: Decreased hearing noted. There is impacted cerumen.     Left Ear: Decreased hearing noted. There is impacted cerumen.     Ears:     Comments: I put colace in both EAC's then irrigated it with water and used an ear pick. He tolerated this well and his hearing has returned to normal. The exam of the EAC's and TM's are normal.    Nose: Nose normal.     Mouth/Throat:     Mouth: Mucous membranes are moist.  Eyes:     General: No scleral icterus.    Conjunctiva/sclera: Conjunctivae normal.  Cardiovascular:     Rate and Rhythm: Normal rate and regular rhythm.     Heart sounds: No murmur heard.   Pulmonary:     Effort: Pulmonary effort is normal.     Breath sounds: No stridor. No wheezing, rhonchi or rales.  Abdominal:     General: Abdomen is protuberant. Bowel sounds are normal. There is no distension.     Palpations: Abdomen is soft. There is no hepatomegaly, splenomegaly or mass.  Musculoskeletal:        General: Normal range  of motion.     Cervical back: Neck supple.     Right lower leg: No edema.     Left lower leg: No edema.  Lymphadenopathy:     Cervical: No cervical adenopathy.  Skin:    General: Skin is warm and dry.     Coloration: Skin is not pale.  Neurological:     General: No focal deficit present.     Mental Status: He is alert.  Psychiatric:        Mood and Affect: Mood normal.        Behavior: Behavior normal.     Lab Results  Component Value Date   WBC 6.9 09/08/2020   HGB 13.8 09/08/2020   HCT 41.2 09/08/2020   PLT 206.0 09/08/2020   GLUCOSE 96 09/08/2020   CHOL 154 09/08/2020   TRIG 242.0 (H) 09/08/2020   HDL 62.40 09/08/2020   LDLDIRECT 65.0 09/08/2020   LDLCALC 62 09/10/2019   ALT 25 09/08/2020   AST 26 09/08/2020   NA 140  09/08/2020   K 4.4 09/08/2020   CL 103 09/08/2020   CREATININE 1.01 09/08/2020   BUN 18 09/08/2020   CO2 32 09/08/2020   TSH 1.87 09/08/2020   PSA 0.01 (L) 09/08/2020   HGBA1C 6.5 09/08/2020    No results found.  Assessment & Plan:   Miles was seen today for hypertension and hyperlipidemia.  Diagnoses and all orders for this visit:  Essential hypertension- His BP is well controlled. -     Basic metabolic panel; Future -     Hepatic function panel; Future -     TSH; Future -     TSH -     Hepatic function panel -     Basic metabolic panel  T15 nutritional deficiency- His H/H are normal now. -     CBC with Differential/Platelet; Future -     Folate; Future -     Folate -     CBC with Differential/Platelet  Hyperlipidemia with target LDL less than 130- LDL goal achieved. Doing well on the statin -     Lipid panel; Future -     Hepatic function panel; Future -     TSH; Future -     TSH -     Hepatic function panel -     Lipid panel -     atorvastatin (LIPITOR) 10 MG tablet; Take 1 tablet (10 mg total) by mouth daily.  Prediabetes- His A1C is up to 6.5% -     Hemoglobin A1c; Future -     Hemoglobin A1c  Pure hyperglyceridemia -     Lipid panel; Future -     Lipid panel  Vitamin D deficiency -     VITAMIN D 25 Hydroxy (Vit-D Deficiency, Fractures); Future -     VITAMIN D 25 Hydroxy (Vit-D Deficiency, Fractures)  S/P prostatectomy -     PSA; Future -     PSA  Need for pneumococcal vaccination -     Pneumococcal polysaccharide vaccine 23-valent greater than or equal to 2yo subcutaneous/IM  BPH associated with nocturia -     PSA; Future -     PSA  Mixed conductive and sensorineural hearing loss of both ears  Hearing loss due to cerumen impaction, bilateral  Carotid atherosclerosis, bilateral -     atorvastatin (LIPITOR) 10 MG tablet; Take 1 tablet (10 mg total) by mouth daily.  Other orders -     LDL cholesterol, direct  I have changed Wesson R.  Scantlin's atorvastatin. I am also having him maintain his multivitamin with minerals, Omega-3, Calcium-Vitamin D3, fluticasone, irbesartan, hydrochlorothiazide, and Vitamin D3.  Meds ordered this encounter  Medications  . atorvastatin (LIPITOR) 10 MG tablet    Sig: Take 1 tablet (10 mg total) by mouth daily.    Dispense:  90 tablet    Refill:  1     Follow-up: Return in about 6 months (around 03/10/2021).  Scarlette Calico, MD

## 2020-09-09 ENCOUNTER — Other Ambulatory Visit: Payer: Self-pay | Admitting: Internal Medicine

## 2020-09-09 DIAGNOSIS — E785 Hyperlipidemia, unspecified: Secondary | ICD-10-CM

## 2020-09-09 DIAGNOSIS — I6523 Occlusion and stenosis of bilateral carotid arteries: Secondary | ICD-10-CM | POA: Insufficient documentation

## 2020-09-09 DIAGNOSIS — E118 Type 2 diabetes mellitus with unspecified complications: Secondary | ICD-10-CM | POA: Insufficient documentation

## 2020-09-09 MED ORDER — ATORVASTATIN CALCIUM 10 MG PO TABS: 1.0000 | ORAL_TABLET | Freq: Every day | ORAL | 1 refills | Status: DC

## 2020-09-11 ENCOUNTER — Telehealth: Payer: Self-pay | Admitting: Internal Medicine

## 2020-09-11 ENCOUNTER — Other Ambulatory Visit: Payer: Self-pay | Admitting: Internal Medicine

## 2020-09-11 DIAGNOSIS — I10 Essential (primary) hypertension: Secondary | ICD-10-CM

## 2020-09-11 MED ORDER — HYDROCHLOROTHIAZIDE 25 MG PO TABS: 1.0000 | ORAL_TABLET | Freq: Every day | ORAL | 1 refills | Status: DC

## 2020-09-11 NOTE — Telephone Encounter (Signed)
hydrochlorothiazide (HYDRODIURIL) 25 MG tablet [id number] [place], [place] - [address] AT [place] Phone:  [phone]  Fax:  [phone]     Patient will be out of medication tomorrow  Last seen- 04.26.22 Next apt- n/a

## 2020-09-14 ENCOUNTER — Telehealth: Payer: Self-pay | Admitting: Pharmacist

## 2020-09-14 NOTE — Progress Notes (Signed)
Chronic Care Management Pharmacy Assistant   Name: Louis Kent  MRN: [id number] DOB: 1940-07-08   Reason for Encounter: Hypertension Disease State Call   Conditions to be addressed/monitored: HTN   Recent office visits:  09/08/20 Dr. Scarlette Calico  Recent consult visits:  None ID  Hospital visits:  None in previous 6 months  Medications: Outpatient Encounter Medications as of 09/14/2020  Medication Sig  . atorvastatin (LIPITOR) 10 MG tablet Take 1 tablet (10 mg total) by mouth daily.  . Calcium Carb-Cholecalciferol (CALCIUM-VITAMIN D3) 600-400 MG-UNIT CAPS Take 1 capsule by mouth daily.  . Cholecalciferol (VITAMIN D3) 50 MCG (2000 UT) TABS TAKE 1 TABLET BY MOUTH EVERY DAY  . fluticasone (FLONASE) 50 MCG/ACT nasal spray Place 2 sprays into both nostrils daily. (Patient not taking: Reported on 09/08/2020)  . hydrochlorothiazide (HYDRODIURIL) 25 MG tablet Take 1 tablet (25 mg total) by mouth daily.  . irbesartan (AVAPRO) 300 MG tablet TAKE 1 TABLET BY MOUTH EVERY DAY  . Multiple Vitamins-Minerals (MULTIVITAMIN WITH MINERALS) tablet Take by mouth.  . Omega-3 1400 MG CAPS Take by mouth.   No facility-administered encounter medications on file as of 09/14/2020.    Reviewed chart prior to disease state call. Spoke with patient regarding BP  Recent Office Vitals: BP Readings from Last 3 Encounters:  09/08/20 130/74  12/03/19 (!) 166/80  09/10/19 (!) 162/96   Pulse Readings from Last 3 Encounters:  09/08/20 75  12/03/19 92  09/10/19 77    Wt Readings from Last 3 Encounters:  09/08/20 241 lb 12.8 oz (109.7 kg)  12/03/19 245 lb (111.1 kg)  09/10/19 244 lb (110.7 kg)     Kidney Function Lab Results  Component Value Date/Time   CREATININE 1.01 09/08/2020 10:29 AM   CREATININE 0.93 09/10/2019 11:58 AM   GFR 69.51 09/08/2020 10:29 AM   GFRNONAA 68.61 02/04/2010 11:20 AM   GFRAA 95 09/18/2007 11:38 AM    BMP Latest Ref Rng & Units 09/08/2020 09/10/2019 03/14/2019   Glucose 70 - 99 mg/dL 96 74 95  BUN 6 - 23 mg/dL 18 17 24(H)  Creatinine 0.40 - 1.50 mg/dL 1.01 0.93 1.02  Sodium 135 - 145 mEq/L 140 137 135  Potassium 3.5 - 5.1 mEq/L 4.4 4.1 4.3  Chloride 96 - 112 mEq/L 103 101 98  CO2 19 - 32 mEq/L 32 31 30  Calcium 8.4 - 10.5 mg/dL 9.3 9.0 9.3    . Current antihypertensive regimen:  Hydrochlorothiazide 25 mg daily, irbesartan 300 mg 1 tab daily  . How often are you checking your Blood Pressure? Patient states that he does not check blood pressure everyday like he should  . Current home BP readings: Patient remembers that his last reading was 139/80  . What recent interventions/DTPs have been made by any provider to improve Blood Pressure control since last CPP Visit: None ID  . Any recent hospitalizations or ED visits since last visit with CPP? None ID  . What diet changes have been made to improve Blood Pressure Control?  Patient states that she has not made any changes in diet  . What exercise is being done to improve your Blood Pressure Control?  Patient states that he has not made any changes to exercise  Adherence Review: Is the patient currently on ACE/ARB medication? Yes, Irbesartan Does the patient have >5 day gap between last estimated fill dates? No   Star Rating Drugs: Atorvastatin 09/09/20 90 ds Irbesartan 300 mg 90 ds  Ethelene Hal Clinical  Pharmacist Assistant [phone]  Time spent:25

## 2020-09-15 DIAGNOSIS — H6122 Impacted cerumen, left ear: Secondary | ICD-10-CM | POA: Diagnosis not present

## 2020-09-26 ENCOUNTER — Other Ambulatory Visit: Payer: Self-pay | Admitting: Internal Medicine

## 2020-09-26 DIAGNOSIS — I10 Essential (primary) hypertension: Secondary | ICD-10-CM

## 2020-09-26 DIAGNOSIS — I6523 Occlusion and stenosis of bilateral carotid arteries: Secondary | ICD-10-CM

## 2020-10-04 DIAGNOSIS — Z23 Encounter for immunization: Secondary | ICD-10-CM | POA: Diagnosis not present

## 2020-11-17 ENCOUNTER — Telehealth: Payer: Medicare Other

## 2020-11-19 ENCOUNTER — Other Ambulatory Visit: Payer: Self-pay

## 2020-11-19 ENCOUNTER — Ambulatory Visit (INDEPENDENT_AMBULATORY_CARE_PROVIDER_SITE_OTHER): Payer: Medicare Other | Admitting: Pharmacist

## 2020-11-19 DIAGNOSIS — E118 Type 2 diabetes mellitus with unspecified complications: Secondary | ICD-10-CM | POA: Diagnosis not present

## 2020-11-19 DIAGNOSIS — I10 Essential (primary) hypertension: Secondary | ICD-10-CM | POA: Diagnosis not present

## 2020-11-19 DIAGNOSIS — I6523 Occlusion and stenosis of bilateral carotid arteries: Secondary | ICD-10-CM

## 2020-11-19 DIAGNOSIS — E785 Hyperlipidemia, unspecified: Secondary | ICD-10-CM

## 2020-11-19 NOTE — Patient Instructions (Signed)
Visit Information  Phone number for Pharmacist: [phone]   Goals Addressed             This Visit's Progress    Manage My Medicine       Timeframe:  Long-Range Goal Priority:  Medium Start Date:        11/19/20                     Expected End Date:    11/19/21                   Follow Up Date Jan 2023   - call for medicine refill 2 or 3 days before it runs out - call if I am sick and can't take my medicine - keep a list of all the medicines I take; vitamins and herbals too    Why is this important?   These steps will help you keep on track with your medicines.   Notes:          Patient verbalizes understanding of instructions provided today and agrees to view in [place].  Telephone follow up appointment with pharmacy team member scheduled for: 1 year  Charlene Brooke, PharmD, [place], CPP Clinical Pharmacist [facility] at [facility] [phone]

## 2020-11-19 NOTE — Progress Notes (Signed)
Chronic Care Management Pharmacy Note  11/19/2020 Name:  Louis Kent MRN:  [id number] DOB:  04/16/1941  Summary: -Pt is compliant with medications and denies issues -Pt experiences occasional leg soreness in mornings, not very bothersome at this time  Recommendations/Changes made from today's visit: -Counseled extensively on DM diagnosis and lifestyle changes; advised to limit carbs/sugar and increase exercise   Subjective: Louis Kent is an 80 y.o. year old male who is a primary patient of [name], MD.  The CCM team was consulted for assistance with disease management and care coordination needs.    Engaged with patient by telephone for follow up visit in response to provider referral for pharmacy case management and/or care coordination services.   Consent to Services:  The patient was given information about Chronic Care Management services, agreed to services, and gave verbal consent prior to initiation of services.  Please see initial visit note for detailed documentation.   Patient Care Team: [name], MD as PCP - General (Internal Medicine) Charlton Haws, [facility] as Pharmacist (Pharmacist)  Recent office visits: 09/08/20 Dr Ronnald Ramp OV: chronic f/u; decreased LOH; A1c 6.5%, new dx DM. Other labs stable. No med changes.  Recent consult visits: None  Hospital visits: None in previous 6 months   Objective:  Lab Results  Component Value Date   CREATININE 1.01 09/08/2020   BUN 18 09/08/2020   GFR 69.51 09/08/2020   GFRNONAA 68.61 02/04/2010   GFRAA 95 09/18/2007   NA 140 09/08/2020   K 4.4 09/08/2020   CALCIUM 9.3 09/08/2020   CO2 32 09/08/2020   GLUCOSE 96 09/08/2020    Lab Results  Component Value Date/Time   HGBA1C 6.5 09/08/2020 10:29 AM   HGBA1C 6.3 09/10/2019 11:58 AM   GFR 69.51 09/08/2020 10:29 AM   GFR 77.98 09/10/2019 11:58 AM    Last diabetic Eye exam: No results found for: HMDIABEYEEXA  Last diabetic Foot exam: No results  found for: HMDIABFOOTEX   Lab Results  Component Value Date   CHOL 154 09/08/2020   HDL 62.40 09/08/2020   LDLCALC 62 09/10/2019   LDLDIRECT 65.0 09/08/2020   TRIG 242.0 (H) 09/08/2020   CHOLHDL 2 09/08/2020    Hepatic Function Latest Ref Rng & Units 09/08/2020 06/25/2018 10/25/2016  Total Protein 6.0 - 8.3 g/dL 6.5 6.9 6.8  Albumin 3.5 - 5.2 g/dL 4.0 4.4 4.2  AST 0 - 37 U/L '26 25 21  ' ALT 0 - 53 U/L '25 29 29  ' Alk Phosphatase 39 - 117 U/L 79 81 91  Total Bilirubin 0.2 - 1.2 mg/dL 0.8 0.7 0.6  Bilirubin, Direct 0.0 - 0.3 mg/dL 0.1 - -    Lab Results  Component Value Date/Time   TSH 1.87 09/08/2020 10:29 AM   TSH 2.46 09/10/2019 11:58 AM    CBC Latest Ref Rng & Units 09/08/2020 09/10/2019 03/14/2019  WBC 4.0 - 10.5 K/uL 6.9 6.8 7.9  Hemoglobin 13.0 - 17.0 g/dL 13.8 14.1 14.6  Hematocrit 39.0 - 52.0 % 41.2 42.5 44.1  Platelets 150.0 - 400.0 K/uL 206.0 208.0 192.0    Lab Results  Component Value Date/Time   VD25OH 40.03 09/08/2020 10:29 AM   VD25OH 46.67 09/10/2019 11:58 AM    Clinical ASCVD: Yes  The ASCVD Risk score ([place]., et al., 2013) failed to calculate for the following reasons:   The 2013 ASCVD risk score is only valid for ages 41 to 57    Depression screen PHQ 2/9  09/08/2020 09/10/2019 06/26/2018  Decreased Interest 0 0 0  Down, Depressed, Hopeless 0 0 0  PHQ - 2 Score 0 0 0  Altered sleeping - - -  Tired, decreased energy - - -  Change in appetite - - -  Feeling bad or failure about yourself  - - -  Trouble concentrating - - -  Moving slowly or fidgety/restless - - -  Suicidal thoughts - - -  PHQ-9 Score - - -  Difficult doing work/chores - - -     Social History   Tobacco Use  Smoking Status Never  Smokeless Tobacco Never   BP Readings from Last 3 Encounters:  09/08/20 130/74  12/03/19 (!) 166/80  09/10/19 (!) 162/96   Pulse Readings from Last 3 Encounters:  09/08/20 75  12/03/19 92  09/10/19 77   Wt Readings from Last 3 Encounters:   09/08/20 241 lb 12.8 oz (109.7 kg)  12/03/19 245 lb (111.1 kg)  09/10/19 244 lb (110.7 kg)   BMI Readings from Last 3 Encounters:  09/08/20 33.72 kg/m  12/03/19 34.17 kg/m  09/10/19 34.03 kg/m    Assessment/Interventions: Review of patient past medical history, allergies, medications, health status, including review of consultants reports, laboratory and other test data, was performed as part of comprehensive evaluation and provision of chronic care management services.   SDOH:  (Social Determinants of Health) assessments and interventions performed: Yes  SDOH Screenings   Alcohol Screen: Not on file  Depression (PHQ2-9): Low Risk    PHQ-2 Score: 0  Financial Resource Strain: Low Risk    Difficulty of Paying Living Expenses: Not hard at all  Food Insecurity: Not on file  Housing: Not on file  Physical Activity: Not on file  Social Connections: Not on file  Stress: Not on file  Tobacco Use: Low Risk    Smoking Tobacco Use: Never   Smokeless Tobacco Use: Never  Transportation Needs: Not on file    [place]  No Known Allergies  Medications Reviewed Today     Reviewed by Charlton Haws, [facility] (Pharmacist) on 11/19/20 at [place] List Status: <None>   Medication Order Taking? Sig Documenting Provider Last Dose Status Informant  atorvastatin (LIPITOR) 10 MG tablet [id number] Yes Take 1 tablet (10 mg total) by mouth daily. [name], MD Taking Active   Calcium Carb-Cholecalciferol (CALCIUM-VITAMIN D3) 600-400 MG-UNIT CAPS [id number] Yes Take 1 capsule by mouth daily. [provider] Taking Active Self  Cholecalciferol (VITAMIN D3) 50 MCG (2000 UT) TABS [id number] Yes TAKE 1 TABLET BY MOUTH EVERY DAY [name], MD Taking Active   fluticasone (FLONASE) 50 MCG/ACT nasal spray [id number] Yes Place 2 sprays into both nostrils daily. Marrian Salvage, FNP Taking Active   hydrochlorothiazide (HYDRODIURIL) 25 MG tablet [id number] Yes Take 1 tablet  (25 mg total) by mouth daily. [name], MD Taking Active   irbesartan (AVAPRO) 300 MG tablet [id number] Yes TAKE 1 TABLET BY MOUTH EVERY DAY [name], MD Taking Active   Multiple Vitamins-Minerals (MULTIVITAMIN WITH MINERALS) tablet [id number] Yes Take by mouth. [provider] Taking Active   Omega-3 1400 MG CAPS [id number] Yes Take by mouth. [provider] Taking Active             Patient Active Problem List   Diagnosis Date Noted   Type II diabetes mellitus with manifestations ([place]) 09/09/2020   Carotid atherosclerosis, bilateral 09/09/2020   BPH associated with nocturia 09/08/2020   Need  for pneumococcal vaccination 09/08/2020   S/P prostatectomy    Word finding difficulty 09/10/2019   Routine general medical examination at a health care facility 06/26/2018   Mixed conductive and sensorineural hearing loss of both ears 06/25/2018   Hearing loss due to cerumen impaction, bilateral 07/20/2017   Vitamin D deficiency 09/17/2015   Carotid atherosclerosis 01/05/2015   Hyperlipidemia with target LDL less than 130 09/03/2014   Symptomatic cholelithiasis 12/30/2013   Ventral hernia 12/30/2013   Prediabetes 12/12/2013   Osteoarthritis of hip 03/03/2011   B12 nutritional deficiency 09/16/2009   Morbid obesity ([place]) 04/01/2009   Pure hyperglyceridemia 02/07/2007   Essential hypertension 02/07/2007    Immunization History  Administered Date(s) Administered   Fluad Quad(high Dose 65+) 01/25/2019   Influenza Split 02/17/2011, 03/22/2012, 01/30/2020   Influenza Whole 05/16/2004, 03/16/2007, 01/30/2008, 03/10/2009, 02/04/2010   Influenza, High Dose Seasonal PF 03/05/2013, 04/08/2015, 03/03/2016, 02/28/2017, 02/01/2018   Influenza,inj,Quad PF,6+ Mos 03/14/2014   PFIZER Comirnaty(Gray Top)Covid-19 Tri-Sucrose Vaccine 07/16/2019, 02/07/2020   PFIZER(Purple Top)SARS-COV-2 Vaccination 06/23/2019, 07/16/2019, 01/30/2020   Pneumococcal Conjugate-13 12/12/2013    Pneumococcal Polysaccharide-23 05/16/2004, 09/02/2014, 09/08/2020   Td 05/16/2002   Tdap 09/13/2012   Zoster Recombinat (Shingrix) 03/15/2019   Zoster, Live 02/04/2010    Conditions to be addressed/monitored:  Hypertension, Hyperlipidemia, and Diabetes, ASCVD  Care Plan : [place]  Updates made by Charlton Haws, [place] since 11/19/2020 12:00 AM     Problem: Hypertension, Hyperlipidemia, and Diabetes, ASCVD   Priority: High     Long-Range Goal: Disease management   Start Date: 11/19/2020  Expected End Date: 11/19/2021  This Visit's Progress: On track  Priority: High  Note:   Current Barriers:  Unable to independently monitor therapeutic efficacy  Pharmacist Clinical Goal(s):  Patient will achieve adherence to monitoring guidelines and medication adherence to achieve therapeutic efficacy through collaboration with PharmD and provider.   Interventions: 1:1 collaboration with [name], MD regarding development and update of comprehensive plan of care as evidenced by provider attestation and co-signature Inter-disciplinary care team collaboration (see longitudinal plan of care) Comprehensive medication review performed; medication list updated in electronic medical record  Hypertension    BP goal < 140/90 Patient checks BP at home 1-2x per week Patient home BP readings are ranging:130s/70s   Patient is currently controlled on the following medications: HCTZ 25 mg daily AM Irbesartan 300 mg daily HS   We discussed: pt endorses compliance with medications; denies issues   Plan: Continue current medications and control with diet and exercise    Hyperlipidemia     LDL goal < 70 Hx carotid atherosclerosis   Patient has failed these meds in past: fenofibrate Patient is currently controlled on the following medications: atorvastatin 10 mg daily, OTC omega 3 fish oil   We discussed: pt endorses compliance with medications; discussed triglycerides and  benefits of Omega-3; pt endorses occasional leg soreness "not cramps" that are not too bothersome    Plan: Continue current medications  Diabetes  A1c goal <7% New dx 08/2020. Diet-controlled.  We discussed: diet and exercise extensively; types of diabetes and pathophysiology; what A1c means and A1c goals; importance of limiting sugar/carbs  Plan: Continue control with diet and exercise   Patient Goals/Self-Care Activities Patient will:  - take medications as prescribed focus on medication adherence by routine target a minimum of 150 minutes of moderate intensity exercise weekly engage in dietary modifications by reducing carbs/sugar      Medication Assistance: None required.  Patient  affirms current coverage meets needs.  Compliance/Adherence/Medication fill history: Care Gaps: Shingrix (dose 2 due 05/10/19) Eye exam  Foot exam  Star-Rating Drugs: Atorvastatin - LF 09/09/20 x 90 ds Irbesartan - LF 09/27/20 x 90 ds  [facility]  [id number] [place] [place] [id number]: [phone]: [phone] [facility] [address] [id number]: [phone]: [phone] Uses pill box? No - prefers bottles Pt endorses 100% compliance  We discussed: Current pharmacy is preferred with insurance plan and patient is satisfied with pharmacy services Patient decided to: Continue current medication management strategy  Care Plan and Follow Up Patient Decision:  Patient agrees to Care Plan and Follow-up.  Plan: Telephone follow up appointment with care management team member scheduled for:  1 year  LCharlene Brooke PharmD, [place] CPP Clinical Pharmacist [phone]

## 2020-12-24 ENCOUNTER — Telehealth: Payer: Self-pay | Admitting: Pharmacist

## 2020-12-24 NOTE — Progress Notes (Signed)
Chronic Care Management Pharmacy Assistant   Name: Louis Kent  MRN: [id number] DOB: [birth year]  Reason for Encounter: Disease State - Hypertension  Recent office visits:  None listed  Recent consult visits:  None listed  Hospital visits:  None in previous 6 months  Medications: Outpatient Encounter Medications as of 12/24/2020  Medication Sig   atorvastatin (LIPITOR) 10 MG tablet Take 1 tablet (10 mg total) by mouth daily.   Calcium Carb-Cholecalciferol (CALCIUM-VITAMIN D3) 600-400 MG-UNIT CAPS Take 1 capsule by mouth daily.   Cholecalciferol (VITAMIN D3) 50 MCG (2000 UT) TABS TAKE 1 TABLET BY MOUTH EVERY DAY   fluticasone (FLONASE) 50 MCG/ACT nasal spray Place 2 sprays into both nostrils daily.   hydrochlorothiazide (HYDRODIURIL) 25 MG tablet Take 1 tablet (25 mg total) by mouth daily.   irbesartan (AVAPRO) 300 MG tablet TAKE 1 TABLET BY MOUTH EVERY DAY   Multiple Vitamins-Minerals (MULTIVITAMIN WITH MINERALS) tablet Take by mouth.   Omega-3 1400 MG CAPS Take by mouth.   No facility-administered encounter medications on file as of 12/24/2020.    Reviewed chart prior to disease state call. Spoke with patient regarding BP  Recent Office Vitals: BP Readings from Last 3 Encounters:  09/08/20 130/74  12/03/19 (!) 166/80  09/10/19 (!) 162/96   Pulse Readings from Last 3 Encounters:  09/08/20 75  12/03/19 92  09/10/19 77    Wt Readings from Last 3 Encounters:  09/08/20 241 lb 12.8 oz (109.7 kg)  12/03/19 245 lb (111.1 kg)  09/10/19 244 lb (110.7 kg)     Kidney Function Lab Results  Component Value Date/Time   CREATININE 1.01 09/08/2020 10:29 AM   CREATININE 0.93 09/10/2019 11:58 AM   GFR 69.51 09/08/2020 10:29 AM   GFRNONAA 68.61 02/04/2010 11:20 AM   GFRAA 95 09/18/2007 11:38 AM    BMP Latest Ref Rng & Units 09/08/2020 09/10/2019 03/14/2019  Glucose 70 - 99 mg/dL 96 74 95  BUN 6 - 23 mg/dL 18 17 24(H)  Creatinine 0.40 - 1.50 mg/dL 1.01 0.93 1.02  Sodium  135 - 145 mEq/L 140 137 135  Potassium 3.5 - 5.1 mEq/L 4.4 4.1 4.3  Chloride 96 - 112 mEq/L 103 101 98  CO2 19 - 32 mEq/L 32 31 30  Calcium 8.4 - 10.5 mg/dL 9.3 9.0 9.3    Current antihypertensive regimen:  HCTZ 25 mg daily AM Irbesartan 300 mg daily HS  How often are you checking your Blood Pressure? 1-2x per week  Current home BP readings:  Patient states his BP has been elevated but he wasn't at home and will call back with his readings. I asked patient to call me back with his readings when he got home.  What recent interventions/DTPs have been made by any provider to improve Blood Pressure control since last CPP Visit:   None listed   Any recent hospitalizations or ED visits since last visit with CPP? No  What diet changes have been made to improve Blood Pressure Control?  Patient states he mainly eats veggies and fruits.  What exercise is being done to improve your Blood Pressure Control?   Patient states he walks daily around his house and outside.  Adherence Review: Is the patient currently on ACE/ARB medication? Yes Does the patient have >5 day gap between last estimated fill dates? No  Star Rating Drugs: Atorvastatin - last fill 12/04/20 90D Irbesartan - last fill 12/22/20 [place], RMA Clinical Pharmacists Assistant [phone]  Time Spent: 79

## 2021-01-06 ENCOUNTER — Telehealth: Payer: Self-pay | Admitting: Internal Medicine

## 2021-01-06 NOTE — Telephone Encounter (Signed)
   Patient calling to report covid+ today, home test.  Runny nose, slight cough, low temp 99, body ache  Seeking advice

## 2021-01-07 ENCOUNTER — Other Ambulatory Visit: Payer: Self-pay | Admitting: Internal Medicine

## 2021-01-07 ENCOUNTER — Telehealth: Payer: Medicare Other | Admitting: Family Medicine

## 2021-01-07 DIAGNOSIS — U071 COVID-19: Secondary | ICD-10-CM

## 2021-01-07 DIAGNOSIS — J208 Acute bronchitis due to other specified organisms: Secondary | ICD-10-CM | POA: Insufficient documentation

## 2021-01-07 DIAGNOSIS — R059 Cough, unspecified: Secondary | ICD-10-CM | POA: Diagnosis not present

## 2021-01-07 MED ORDER — MOLNUPIRAVIR EUA 200MG CAPSULE
4.0000 | ORAL_CAPSULE | Freq: Two times a day (BID) | ORAL | 0 refills | Status: AC
Start: 2021-01-07 — End: 2021-01-12

## 2021-01-07 NOTE — Patient Instructions (Signed)
I appreciate the opportunity to provide you with care for your health and wellness.  Please take your antiviral in full. Message or call if you have any concerns or issues. Below is information on COVID and the antiviral  Please isolate until the antiviral is completed  Please continue to practice social distancing to keep you, your family, and our community safe.  If you must go out, please wear a mask and practice good handwashing.  Have a wonderful day. With Gratitude, Cherly Beach, DNP, AGNP-BC  To keep from spreading the disease you should: Stay home and limit contact with other people as much as possible. Wash your hands frequently. Cover your coughs and sneezes with a tissue, and throw used tissues in the trash.   Clean and disinfect frequently touched surfaces and objects.     Take care of yourself by: Staying home Resting Drinking fluids Take fever-reducing medications (Tylenol/Acetaminophen)   For more information on the disease go to the Centers for Disease Control and Prevention website      Can take to lessen severity: Vit C '500mg'$  twice daily Quercertin 250-'500mg'$  twice daily Zinc 75-'100mg'$  daily Melatonin 3-6 mg at bedtime Vit D3 1000-2000 IU daily Aspirin 81 mg daily with food Optional: Famotidine '20mg'$  daily Also can add tylenol/ibuprofen as needed for fevers and body aches May add Mucinex or Mucinex DM as needed for cough/congestion     10 Things You Can Do to Manage Your COVID-19 Symptoms at Home If you have possible or confirmed COVID-19: Stay home except to get medical care. Monitor your symptoms carefully. If your symptoms get worse, call your healthcare provider immediately. Get rest and stay hydrated. If you have a medical appointment, call the healthcare provider ahead of time and tell them that you have or may have COVID-19. For medical emergencies, call 911 and notify the dispatch personnel that you have or may have COVID-19. Cover your cough  and sneezes with a tissue or use the inside of your elbow. Wash your hands often with soap and water for at least 20 seconds or clean your hands with an alcohol-based hand sanitizer that contains at least 60% alcohol. As much as possible, stay in a specific room and away from other people in your home. Also, you should use a separate bathroom, if available. If you need to be around other people in or outside of the home, wear a mask. Avoid sharing personal items with other people in your household, like dishes, towels, and bedding. Clean all surfaces that are touched often, like counters, tabletops, and doorknobs. Use household cleaning sprays or wipes according to the label instructions. June 11/29/2019 This information is not intended to replace advice given to you by your health care provider. Make sure you discuss any questions you have with your health care provider. Document Revised: 03/16/2020 Document Reviewed: 03/16/2020 Elsevier Patient Education  2021 [place] Oral Capsules What is this medication? MOLNUPIRAVIR (mol nue pir a vir) treats COVID-19. It is an antiviral medication. It may decrease the risk of developing severe symptoms of COVID-19. It may also decrease the chance of going to the hospital. This medication is not approved by the FDA. The FDA has authorized emergency use of this medication during the COVID-19 pandemic. This medicine may be used for other purposes; ask your health care provider or pharmacist if you have questions. COMMON BRAND NAME(S): LAGEVRIO What should I tell my care team before I take this medication? They need to know if you  have any of these conditions: Any allergies Any serious illness An unusual or allergic reaction to molnupiravir, other medications, foods, dyes, or preservatives Pregnant or trying to get pregnant Breast-feeding How should I use this medication? Take this medication by mouth with water. Take it  as directed on the prescription label at the same time every day. Do not cut, crush or chew this medication. Swallow the capsules whole. You can take it with or without food. If it upsets your stomach, take it with food. Take all of this medication unless your care team tells you to stop it early. Keep taking it even if you think you are better. Talk to your care team about the use of this medication in children. Special care may be needed. Overdosage: If you think you have taken too much of this medicine contact a poison control center or emergency room at once. NOTE: This medicine is only for you. Do not share this medicine with others. What if I miss a dose? If you miss a dose, take it as soon as you can unless it is more than 10 hours late. If it is more than 10 hours late, skip the missed dose. Take the next dose at the normal time. Do not take extra or 2 doses at the same time to make up for the missed dose. What may interact with this medication? Interactions have not been studied. This list may not describe all possible interactions. Give your health care provider a list of all the medicines, herbs, non-prescription drugs, or dietary supplements you use. Also tell them if you smoke, drink alcohol, or use illegal drugs. Some items may interact with your medicine. What should I watch for while using this medication? Your condition will be monitored carefully while you are receiving this medication. Visit your care team for regular checkups. Tell your care team if your symptoms do not start to get better or if they get worse. Do not become pregnant while taking this medication. You may need a pregnancy test before starting this medication. Women must use a reliable form of birth control while taking this medication and for 4 days after stopping the medication. Women should inform their care team if they wish to become pregnant or think they might be pregnant. Men should not father a child while  taking this medication and for 3 months after stopping it. There is potential for serious harm to an unborn child. Talk to your care team for more information. Do not breast-feed an infant while taking this medication and for 4 days after stopping the medication. What side effects may I notice from receiving this medication? Side effects that you should report to your care team as soon as possible: Allergic reactions-skin rash, itching, hives, swelling of the face, lips, tongue, or throat Side effects that usually do not require medical attention (report these to your care team if they continue or are bothersome): Diarrhea Dizziness Nausea This list may not describe all possible side effects. Call your doctor for medical advice about side effects. You may report side effects to FDA at 1-800-FDA-1088. Where should I keep my medication? Keep out of the reach of children and pets. Store at room temperature between 20 and 25 degrees C (68 and 77 degrees F). Get rid of any unused medication after the expiration date. To get rid of medications that are no longer needed or have expired: Take the medication to a medication take-back program. Check with your pharmacy or law enforcement to  find a location. If you cannot return the medication, check the label or package insert to see if the medication should be thrown out in the garbage or flushed down the toilet. If you are not sure, ask your care team. If it is safe to put it in the trash, take the medication out of the container. Mix the medication with cat litter, dirt, coffee grounds, or other unwanted substance. Seal the mixture in a bag or container. Put it in the trash. NOTE: This sheet is a summary. It may not cover all possible information. If you have questions about this medicine, talk to your doctor, pharmacist, or health care provider.  2022 Elsevier/Gold Standard (2020-05-11 16:16:01)

## 2021-01-07 NOTE — Progress Notes (Signed)
Mr. snow, fleeger are scheduled for a virtual visit with your provider today.    Just as we do with appointments in the office, we must obtain your consent to participate.  Your consent will be active for this visit and any virtual visit you may have with one of our providers in the next 365 days.    If you have a MyChart account, I can also send a copy of this consent to you electronically.  All virtual visits are billed to your insurance company just like a traditional visit in the office.  As this is a virtual visit, video technology does not allow for your provider to perform a traditional examination.  This may limit your provider's ability to fully assess your condition.  If your provider identifies any concerns that need to be evaluated in person or the need to arrange testing such as labs, EKG, etc, we will make arrangements to do so.    Although advances in technology are sophisticated, we cannot ensure that it will always work on either your end or our end.  If the connection with a video visit is poor, we may have to switch to a telephone visit.  With either a video or telephone visit, we are not always able to ensure that we have a secure connection.   I need to obtain your verbal consent now.   Are you willing to proceed with your visit today?   ARVILL LIBENGOOD has provided verbal consent on 01/07/2021 for a virtual visit (video or telephone).   Perlie Mayo, NP 01/07/2021  10:14 AM   Date:  01/07/2021   ID:  [name], DOB Sep 17, 1940, MRN [id number]  Patient Location: Home Provider Location: Home Office   Participants: Patient and Provider for Visit and Wrap up  Method of visit: Video  Location of Patient: Home Location of Provider: Home Office Consent was obtain for visit over the video. Services rendered by provider: Visit was performed via video  A video enabled telemedicine application was used and I verified that I am speaking with the correct person using two  identifiers.  PCP:  [name], MD   Chief Complaint:  Rayetta Humphrey +   History of Present Illness:    DEMIAN VOGELE is a 80 y.o. male with history as stated below. Presents video telehealth for an acute care visit due to covid + on Wednesday 01/06/21 Started with sore throat feeling- started up Tuesday, mild cough- related to runny nose/drainage and low grade ever yesterday. Has been around his grandchildren.  Denies having shortness of breath, chest pain, ear pain, or exposure to covid or other sick contacts.  Modifying factors include: tylenol No other aggravating or relieving factors.  No other c/o.  Past Medical, Surgical, Social History, Allergies, and Medications have been Reviewed.  Past Medical History:  Diagnosis Date   Hyperlipidemia    Hypertension    S/P prostatectomy     No outpatient medications have been marked as taking for the 01/07/21 encounter (Appointment) with [place].     Allergies:   Patient has no known allergies.   ROS See HPI for history of present illness.  Physical Exam Constitutional:      Appearance: Normal appearance.  HENT:     Head: Normocephalic.     Nose: Nose normal.  Eyes:     Conjunctiva/sclera: Conjunctivae normal.  Pulmonary:     Effort: Pulmonary effort is normal.     Comments: No shortness of breath  in conversation  Musculoskeletal:        General: Normal range of motion.     Cervical back: Normal range of motion.  Neurological:     General: No focal deficit present.     Mental Status: He is alert and oriented to person, place, and time.  Psychiatric:        Mood and Affect: Mood normal.        Behavior: Behavior normal.        Thought Content: Thought content normal.        Judgment: Judgment normal.              A&P  1. COVID-19 -S&S are consistent with COVID -OTC and symptom management reviewed and provided on AVS - Reviewed side effects, risks and benefits of medication.   Patient acknowledged  agreement and understanding of the plan.    - molnupiravir EUA 200 mg CAPS; Take 4 capsules (800 mg total) by mouth 2 (two) times daily for 5 days.  Dispense: 40 capsule; Refill: 0  2. Cough -OTC measures reviewed and provided on AVS   I discussed the assessment and treatment plan with the patient. The patient was provided an opportunity to ask questions and all were answered. The patient agreed with the plan and demonstrated an understanding of the instructions.   The patient was advised to call back or seek an in-person evaluation if the symptoms worsen or if the condition fails to improve as anticipated.   The above assessment and management plan was discussed with the patient. The patient verbalized understanding of and has agreed to the management plan. Patient is aware to call the clinic if symptoms persist or worsen. Patient is aware when to return to the clinic for a follow-up visit. Patient educated on when it is appropriate to go to the emergency department.    Time:   Today, I have spent 10 minutes with the patient with telehealth technology discussing the above problems, reviewing the chart, previous notes, medications and orders.   Medication Changes: No orders of the defined types were placed in this encounter.    Disposition:  Follow up PRN Signed, Perlie Mayo, NP  01/07/2021 10:14 AM

## 2021-02-17 ENCOUNTER — Other Ambulatory Visit: Payer: Self-pay | Admitting: Internal Medicine

## 2021-02-17 DIAGNOSIS — E559 Vitamin D deficiency, unspecified: Secondary | ICD-10-CM

## 2021-02-23 DIAGNOSIS — Z23 Encounter for immunization: Secondary | ICD-10-CM | POA: Diagnosis not present

## 2021-02-24 ENCOUNTER — Ambulatory Visit: Payer: Medicare Other

## 2021-03-01 ENCOUNTER — Other Ambulatory Visit: Payer: Self-pay | Admitting: Internal Medicine

## 2021-03-01 DIAGNOSIS — I10 Essential (primary) hypertension: Secondary | ICD-10-CM

## 2021-03-01 DIAGNOSIS — I6523 Occlusion and stenosis of bilateral carotid arteries: Secondary | ICD-10-CM

## 2021-03-01 DIAGNOSIS — E785 Hyperlipidemia, unspecified: Secondary | ICD-10-CM

## 2021-03-09 DIAGNOSIS — H25813 Combined forms of age-related cataract, bilateral: Secondary | ICD-10-CM | POA: Diagnosis not present

## 2021-03-09 NOTE — Progress Notes (Signed)
Subjective:    Patient ID: Louis Kent, male    DOB: [birth year], 80 y.o.   MRN: [id number]  This visit occurred during the SARS-CoV-2 public health emergency.  Safety protocols were in place, including screening questions prior to the visit, additional usage of staff PPE, and extensive cleaning of exam room while observing appropriate contact time as indicated for disinfecting solutions.     HPI The patient is here for follow up of their chronic medical problems, including htn, hld, DM  Having increased ankle swelling.  He has had ankle swelling for a while, but he thinks it has gotten little worse.  It does go away overnight and then gets worse as the day progresses.  He does spend a lot of time sitting at his computer.  He denies any shortness of breath.  He is taking his medication daily as prescribed.  Medications and allergies reviewed with patient and updated if appropriate.  Patient Active Problem List   Diagnosis Date Noted   Acute bronchitis due to COVID-19 virus 01/07/2021   Type II diabetes mellitus with manifestations ([place]) 09/09/2020   Carotid atherosclerosis, bilateral 09/09/2020   BPH associated with nocturia 09/08/2020   Need for pneumococcal vaccination 09/08/2020   S/P prostatectomy    Word finding difficulty 09/10/2019   Routine general medical examination at a health care facility 06/26/2018   Mixed conductive and sensorineural hearing loss of both ears 06/25/2018   Hearing loss due to cerumen impaction, bilateral 07/20/2017   Vitamin D deficiency 09/17/2015   Carotid atherosclerosis 01/05/2015   Hyperlipidemia with target LDL less than 130 09/03/2014   Symptomatic cholelithiasis 12/30/2013   Ventral hernia 12/30/2013   Prediabetes 12/12/2013   Osteoarthritis of hip 03/03/2011   B12 nutritional deficiency 09/16/2009   Morbid obesity ([place]) 04/01/2009   Pure hyperglyceridemia 02/07/2007   Essential hypertension 02/07/2007    Current Outpatient  Medications on File Prior to Visit  Medication Sig Dispense Refill   atorvastatin (LIPITOR) 10 MG tablet Take 1 tablet (10 mg total) by mouth daily. 90 tablet 1   Calcium Carb-Cholecalciferol (CALCIUM-VITAMIN D3) 600-400 MG-UNIT CAPS Take 1 capsule by mouth daily.     Cholecalciferol (VITAMIN D3) 50 MCG (2000 UT) TABS TAKE 1 TABLET BY MOUTH EVERY DAY 90 tablet 1   hydrochlorothiazide (HYDRODIURIL) 25 MG tablet Take 1 tablet (25 mg total) by mouth daily. 90 tablet 1   irbesartan (AVAPRO) 300 MG tablet TAKE 1 TABLET BY MOUTH EVERY DAY 90 tablet 1   Multiple Vitamins-Minerals (MULTIVITAMIN WITH MINERALS) tablet Take by mouth.     Omega-3 1400 MG CAPS Take by mouth.     No current facility-administered medications on file prior to visit.    Past Medical History:  Diagnosis Date   Hyperlipidemia    Hypertension    S/P prostatectomy     Past Surgical History:  Procedure Laterality Date   ANKLE ARTHROSCOPY W/ OPEN REPAIR     bilater hip replacements     PROSTATE SURGERY     TONSILLECTOMY      Social History   Socioeconomic History   Marital status: Widowed    Spouse name: Not on file   Number of children: 2   Years of education: Not on file   Highest education level: Not on file  Occupational History   Occupation: retired    Fish farm manager: RETIRED  Tobacco Use   Smoking status: Never   Smokeless tobacco: Never  Vaping Use   Vaping  Use: Never used  Substance and Sexual Activity   Alcohol use: No   Drug use: No   Sexual activity: Not Currently  Other Topics Concern   Not on file  Social History Narrative   Not on file   Social Determinants of Health   Financial Resource Strain: Not on file  Food Insecurity: Not on file  Transportation Needs: Not on file  Physical Activity: Not on file  Stress: Not on file  Social Connections: Not on file    Family History  Problem Relation Age of Onset   Stroke Mother    Diabetes Mother    Lung cancer Father        lung cancer    Lung cancer Other     Review of Systems  Constitutional:  Negative for chills and fever.  Respiratory:  Negative for cough, shortness of breath and wheezing.   Cardiovascular:  Positive for leg swelling. Negative for chest pain and palpitations.  Neurological:  Negative for light-headedness and headaches.      Objective:   Vitals:   03/10/21 1030  BP: 134/70  Pulse: 77  Temp: 98.3 F (36.8 C)  SpO2: 95%   BP Readings from Last 3 Encounters:  03/10/21 134/70  09/08/20 130/74  12/03/19 (!) 166/80   Wt Readings from Last 3 Encounters:  03/10/21 242 lb (109.8 kg)  09/08/20 241 lb 12.8 oz (109.7 kg)  12/03/19 245 lb (111.1 kg)   Body mass index is 33.75 kg/m.   Physical Exam    Constitutional: Appears well-developed and well-nourished. No distress.  HENT:  Head: Normocephalic and atraumatic.  Neck: Neck supple. No tracheal deviation present. No thyromegaly present.  No cervical lymphadenopathy Cardiovascular: Normal rate, regular rhythm and normal heart sounds.   No murmur heard. No carotid bruit .  1+ mildly pitting edema bilateral lower extremities Pulmonary/Chest: Effort normal and breath sounds normal. No respiratory distress. No has no wheezes. No rales.  Skin: Skin is warm and dry. Not diaphoretic.  Psychiatric: Normal mood and affect. Behavior is normal.      Assessment & Plan:    Essential hypertension: Chronic Blood pressure well controlled CMP Continue hydrochlorothiazide 25 mg daily, and irbesartan 300 mg daily  Hyperlipidemia: Chronic Regular exercise and healthy diet encouraged Check lipid panel  Continue atorvastatin 10 mg daily  Diabetes, type II: Chronic Currently diet controlled A1c 6.6% 6 months ago-newly diagnosed diabetic Discussed the importance of low sugar/carb diet, regular exercise and ideally a little weight loss Will check A1c today He deferred a referral for nutrition  Leg edema: Chronic, but has worsened Currently on  hydrochlorothiazide 25 mg daily No evidence of heart failure-denies any shortness of breath Likely venous insufficiency Discussed causes of leg edema Discussed trying compression socks, elevating legs when sitting, decreasing salt intake and continuing regular exercise.  Encouraged him to get up and walk around if he sitting at the computer for a long time Continue hydrochlorothiazide 25 mg daily Discussed that if these conservative measures do not keep leg swelling controlled/improved can consider change in medication CMP

## 2021-03-09 NOTE — Patient Instructions (Addendum)
    Blood work was ordered.      Medications changes include :   none     Please followup in 6 months  

## 2021-03-10 ENCOUNTER — Other Ambulatory Visit: Payer: Self-pay

## 2021-03-10 ENCOUNTER — Other Ambulatory Visit: Payer: Self-pay | Admitting: Internal Medicine

## 2021-03-10 ENCOUNTER — Encounter: Payer: Self-pay | Admitting: Internal Medicine

## 2021-03-10 ENCOUNTER — Ambulatory Visit (INDEPENDENT_AMBULATORY_CARE_PROVIDER_SITE_OTHER): Payer: Medicare Other | Admitting: Internal Medicine

## 2021-03-10 VITALS — BP 134/70 | HR 77 | Temp 98.3°F | Ht 71.0 in | Wt 242.0 lb

## 2021-03-10 DIAGNOSIS — I10 Essential (primary) hypertension: Secondary | ICD-10-CM | POA: Diagnosis not present

## 2021-03-10 DIAGNOSIS — E118 Type 2 diabetes mellitus with unspecified complications: Secondary | ICD-10-CM

## 2021-03-10 DIAGNOSIS — E785 Hyperlipidemia, unspecified: Secondary | ICD-10-CM | POA: Diagnosis not present

## 2021-03-10 DIAGNOSIS — R600 Localized edema: Secondary | ICD-10-CM | POA: Diagnosis not present

## 2021-03-10 DIAGNOSIS — I6523 Occlusion and stenosis of bilateral carotid arteries: Secondary | ICD-10-CM

## 2021-03-10 LAB — COMPREHENSIVE METABOLIC PANEL
ALT: 21 U/L (ref 0–53)
AST: 26 U/L (ref 0–37)
Albumin: 4.3 g/dL (ref 3.5–5.2)
Alkaline Phosphatase: 103 U/L (ref 39–117)
BUN: 17 mg/dL (ref 6–23)
CO2: 31 mEq/L (ref 19–32)
Calcium: 9.3 mg/dL (ref 8.4–10.5)
Chloride: 102 mEq/L (ref 96–112)
Creatinine, Ser: 1.03 mg/dL (ref 0.40–1.50)
GFR: 67.65 mL/min (ref >60.00)
Glucose, Bld: 90 mg/dL (ref 70–99)
Potassium: 4.5 mEq/L (ref 3.5–5.1)
Sodium: 139 mEq/L (ref 135–145)
Total Bilirubin: 0.6 mg/dL (ref 0.2–1.2)
Total Protein: 7.1 g/dL (ref 6.0–8.3)

## 2021-03-10 LAB — LIPID PANEL
Cholesterol: 145 mg/dL (ref 0–200)
HDL: 55.1 mg/dL (ref >39.00)
NonHDL: 90.2
Total CHOL/HDL Ratio: 3
Triglycerides: 203 mg/dL — ABNORMAL HIGH (ref 0.0–149.0)
VLDL: 40.6 mg/dL — ABNORMAL HIGH (ref 0.0–40.0)

## 2021-03-10 LAB — HEMOGLOBIN A1C: Hgb A1c MFr Bld: 6.4 % (ref 4.6–6.5)

## 2021-03-10 LAB — LDL CHOLESTEROL, DIRECT: Direct LDL: 47 mg/dL

## 2021-03-12 ENCOUNTER — Other Ambulatory Visit: Payer: Self-pay | Admitting: Internal Medicine

## 2021-03-12 DIAGNOSIS — I10 Essential (primary) hypertension: Secondary | ICD-10-CM

## 2021-03-12 DIAGNOSIS — I6523 Occlusion and stenosis of bilateral carotid arteries: Secondary | ICD-10-CM

## 2021-03-28 ENCOUNTER — Other Ambulatory Visit: Payer: Self-pay | Admitting: Internal Medicine

## 2021-03-28 DIAGNOSIS — I6523 Occlusion and stenosis of bilateral carotid arteries: Secondary | ICD-10-CM

## 2021-03-28 DIAGNOSIS — E785 Hyperlipidemia, unspecified: Secondary | ICD-10-CM

## 2021-04-19 DIAGNOSIS — Z85828 Personal history of other malignant neoplasm of skin: Secondary | ICD-10-CM | POA: Diagnosis not present

## 2021-04-19 DIAGNOSIS — L570 Actinic keratosis: Secondary | ICD-10-CM | POA: Diagnosis not present

## 2021-04-19 DIAGNOSIS — L738 Other specified follicular disorders: Secondary | ICD-10-CM | POA: Diagnosis not present

## 2021-04-19 DIAGNOSIS — L821 Other seborrheic keratosis: Secondary | ICD-10-CM | POA: Diagnosis not present

## 2021-04-19 DIAGNOSIS — L820 Inflamed seborrheic keratosis: Secondary | ICD-10-CM | POA: Diagnosis not present

## 2021-05-19 DIAGNOSIS — H6123 Impacted cerumen, bilateral: Secondary | ICD-10-CM | POA: Diagnosis not present

## 2021-05-24 ENCOUNTER — Telehealth: Payer: Self-pay

## 2021-05-24 NOTE — Progress Notes (Signed)
° ° °  Chronic Care Management Pharmacy Assistant   Name: Louis Kent  MRN: [id number] DOB: 1941/03/18  Reason for Encounter: Disease State - Hypertension   Recent office visits:  03/10/21 Quay Burow (PCP) - Essential hypertension. D/c Fluticasone Propionate 50 mcg.  Recent consult visits:  None listed  Hospital visits:  None in previous 6 months  Medications: Outpatient Encounter Medications as of 05/24/2021  Medication Sig   atorvastatin (LIPITOR) 10 MG tablet TAKE 1 TABLET BY MOUTH EVERY DAY   Calcium Carb-Cholecalciferol (CALCIUM-VITAMIN D3) 600-400 MG-UNIT CAPS Take 1 capsule by mouth daily.   Cholecalciferol (VITAMIN D3) 50 MCG (2000 UT) TABS TAKE 1 TABLET BY MOUTH EVERY DAY   hydrochlorothiazide (HYDRODIURIL) 25 MG tablet TAKE 1 TABLET (25 MG TOTAL) BY MOUTH DAILY.   irbesartan (AVAPRO) 300 MG tablet TAKE 1 TABLET BY MOUTH EVERY DAY   Multiple Vitamins-Minerals (MULTIVITAMIN WITH MINERALS) tablet Take by mouth.   Omega-3 1400 MG CAPS Take by mouth.   No facility-administered encounter medications on file as of 05/24/2021.  Reviewed chart prior to disease state call. Spoke with patient regarding BP  Recent Office Vitals: BP Readings from Last 3 Encounters:  03/10/21 134/70  09/08/20 130/74  12/03/19 (!) 166/80   Pulse Readings from Last 3 Encounters:  03/10/21 77  09/08/20 75  12/03/19 92    Wt Readings from Last 3 Encounters:  03/10/21 242 lb (109.8 kg)  09/08/20 241 lb 12.8 oz (109.7 kg)  12/03/19 245 lb (111.1 kg)     Kidney Function Lab Results  Component Value Date/Time   CREATININE 1.03 03/10/2021 11:05 AM   CREATININE 1.01 09/08/2020 10:29 AM   GFR 67.65 03/10/2021 11:05 AM   GFRNONAA 68.61 02/04/2010 11:20 AM   GFRAA 95 09/18/2007 11:38 AM    BMP Latest Ref Rng & Units 03/10/2021 09/08/2020 09/10/2019  Glucose 70 - 99 mg/dL 90 96 74  BUN 6 - 23 mg/dL 17 18 17   Creatinine 0.40 - 1.50 mg/dL 1.03 1.01 0.93  Sodium 135 - 145 mEq/L 139 140 137  Potassium  3.5 - 5.1 mEq/L 4.5 4.4 4.1  Chloride 96 - 112 mEq/L 102 103 101  CO2 19 - 32 mEq/L 31 32 31  Calcium 8.4 - 10.5 mg/dL 9.3 9.3 9.0    Current antihypertensive regimen:  HCTZ 25 mg daily AM Irbesartan 300 mg daily HS  How often are you checking your Blood Pressure?  3-5x per week  Current home BP readings:  Patient states his last reading was 138/75.  What recent interventions/DTPs have been made by any provider to improve Blood Pressure control since last CPP Visit:   None noted  Any recent hospitalizations or ED visits since last visit with CPP? No  What diet changes have been made to improve Blood Pressure Control?   Patient states no recent diet changes.  What exercise is being done to improve your Blood Pressure Control?  Patient states he still works and measure his daily steps.  Adherence Review: Is the patient currently on ACE/ARB medication? Yes Does the patient have >5 day gap between last estimated fill dates? No   Care Gaps Colonoscopy - NA Diabetic Foot Exam - NA Mammogram - NA Ophthalmology - NA Dexa Scan - 05/08/2003 Annual Well Visit - 09/08/20 Micro albumin - NA Hemoglobin A1c -  03/10/21  Star Rating Drugs: Atorvastatin - last fill 03/28/21 90D Irbesartan - last fill 03/13/21 [place], [place] Clinical Pharmacists Assistant [phone]

## 2021-08-16 ENCOUNTER — Other Ambulatory Visit: Payer: Self-pay | Admitting: Internal Medicine

## 2021-08-16 DIAGNOSIS — E559 Vitamin D deficiency, unspecified: Secondary | ICD-10-CM

## 2021-08-31 ENCOUNTER — Other Ambulatory Visit: Payer: Self-pay | Admitting: Internal Medicine

## 2021-08-31 DIAGNOSIS — I10 Essential (primary) hypertension: Secondary | ICD-10-CM

## 2021-09-04 ENCOUNTER — Other Ambulatory Visit: Payer: Self-pay | Admitting: Internal Medicine

## 2021-09-04 DIAGNOSIS — I6523 Occlusion and stenosis of bilateral carotid arteries: Secondary | ICD-10-CM

## 2021-09-04 DIAGNOSIS — I10 Essential (primary) hypertension: Secondary | ICD-10-CM

## 2021-09-15 ENCOUNTER — Encounter: Payer: Self-pay | Admitting: Internal Medicine

## 2021-09-15 ENCOUNTER — Ambulatory Visit (INDEPENDENT_AMBULATORY_CARE_PROVIDER_SITE_OTHER): Payer: Medicare Other | Admitting: Internal Medicine

## 2021-09-15 VITALS — BP 142/80 | HR 77 | Temp 98.1°F | Ht 71.0 in | Wt 244.0 lb

## 2021-09-15 DIAGNOSIS — I10 Essential (primary) hypertension: Secondary | ICD-10-CM | POA: Diagnosis not present

## 2021-09-15 DIAGNOSIS — R351 Nocturia: Secondary | ICD-10-CM | POA: Diagnosis not present

## 2021-09-15 DIAGNOSIS — N401 Enlarged prostate with lower urinary tract symptoms: Secondary | ICD-10-CM

## 2021-09-15 DIAGNOSIS — E538 Deficiency of other specified B group vitamins: Secondary | ICD-10-CM

## 2021-09-15 DIAGNOSIS — I6523 Occlusion and stenosis of bilateral carotid arteries: Secondary | ICD-10-CM

## 2021-09-15 DIAGNOSIS — E118 Type 2 diabetes mellitus with unspecified complications: Secondary | ICD-10-CM | POA: Diagnosis not present

## 2021-09-15 LAB — BASIC METABOLIC PANEL
BUN: 22 mg/dL (ref 6–23)
CO2: 29 mEq/L (ref 19–32)
Calcium: 9.4 mg/dL (ref 8.4–10.5)
Chloride: 102 mEq/L (ref 96–112)
Creatinine, Ser: 1.13 mg/dL (ref 0.40–1.50)
GFR: 60.31 mL/min (ref >60.00)
Glucose, Bld: 152 mg/dL — ABNORMAL HIGH (ref 70–99)
Potassium: 4.9 mEq/L (ref 3.5–5.1)
Sodium: 139 mEq/L (ref 135–145)

## 2021-09-15 LAB — PSA: PSA: 0 ng/mL — ABNORMAL LOW (ref 0.10–4.00)

## 2021-09-15 LAB — URINALYSIS, ROUTINE W REFLEX MICROSCOPIC
Bilirubin Urine: NEGATIVE
Hgb urine dipstick: NEGATIVE
Ketones, ur: NEGATIVE
Leukocytes,Ua: NEGATIVE
Nitrite: NEGATIVE
RBC / HPF: NONE SEEN (ref 0–?)
Specific Gravity, Urine: 1.015 (ref 1.000–1.030)
Total Protein, Urine: NEGATIVE
Urine Glucose: NEGATIVE
Urobilinogen, UA: 1 (ref 0.0–1.0)
WBC, UA: NONE SEEN (ref 0–?)
pH: 7 (ref 5.0–8.0)

## 2021-09-15 LAB — CBC WITH DIFFERENTIAL/PLATELET
Basophils Absolute: 0 10*3/uL (ref 0.0–0.1)
Basophils Relative: 0.7 % (ref 0.0–3.0)
Eosinophils Absolute: 0.1 10*3/uL (ref 0.0–0.7)
Eosinophils Relative: 1.7 % (ref 0.0–5.0)
HCT: 43 % (ref 39.0–52.0)
Hemoglobin: 14.2 g/dL (ref 13.0–17.0)
Lymphocytes Relative: 21.2 % (ref 12.0–46.0)
Lymphs Abs: 1.4 10*3/uL (ref 0.7–4.0)
MCHC: 33 g/dL (ref 30.0–36.0)
MCV: 93.3 fl (ref 78.0–100.0)
Monocytes Absolute: 0.6 10*3/uL (ref 0.1–1.0)
Monocytes Relative: 8.9 % (ref 3.0–12.0)
Neutro Abs: 4.4 10*3/uL (ref 1.4–7.7)
Neutrophils Relative %: 67.5 % (ref 43.0–77.0)
Platelets: 185 10*3/uL (ref 150.0–400.0)
RBC: 4.61 Mil/uL (ref 4.22–5.81)
RDW: 13.5 % (ref 11.5–15.5)
WBC: 6.5 10*3/uL (ref 4.0–10.5)

## 2021-09-15 LAB — MICROALBUMIN / CREATININE URINE RATIO
Creatinine,U: 124 mg/dL
Microalb Creat Ratio: 7.6 mg/g (ref 0.0–30.0)
Microalb, Ur: 9.5 mg/dL — ABNORMAL HIGH (ref 0.0–1.9)

## 2021-09-15 LAB — FOLATE: Folate: >23.5 ng/mL (ref >5.9)

## 2021-09-15 LAB — HEMOGLOBIN A1C: Hgb A1c MFr Bld: 6.3 % (ref 4.6–6.5)

## 2021-09-15 LAB — VITAMIN B12: Vitamin B-12: 373 pg/mL (ref 211–911)

## 2021-09-15 NOTE — Progress Notes (Signed)
? ?Subjective:  ?Patient ID: Louis Kent, male    DOB: [birth year]  Age: 81 y.o. MRN: [id number] ? ?CC: Hypertension ? ? ?HPI ?Louis Kent presents for f/up - ? ?He walks about 5000 steps a day.  He does not experience chest pain, shortness of breath, diaphoresis, or edema. ? ?Outpatient Medications Prior to Visit  ?Medication Sig Dispense Refill  ? atorvastatin (LIPITOR) 10 MG tablet TAKE 1 TABLET BY MOUTH EVERY DAY 90 tablet 1  ? Calcium Carb-Cholecalciferol (CALCIUM-VITAMIN D3) 600-400 MG-UNIT CAPS Take 1 capsule by mouth daily.    ? Cholecalciferol (VITAMIN D3) 50 MCG (2000 UT) TABS TAKE 1 TABLET BY MOUTH EVERY DAY 90 tablet 1  ? hydrochlorothiazide (HYDRODIURIL) 25 MG tablet TAKE 1 TABLET (25 MG TOTAL) BY MOUTH DAILY. 90 tablet 0  ? irbesartan (AVAPRO) 300 MG tablet TAKE 1 TABLET BY MOUTH EVERY DAY 90 tablet 0  ? Multiple Vitamins-Minerals (MULTIVITAMIN WITH MINERALS) tablet Take by mouth.    ? Omega-3 1400 MG CAPS Take by mouth.    ? ?No facility-administered medications prior to visit.  ? ? ?ROS ?Review of Systems  ?Constitutional:  Negative for chills, diaphoresis, fatigue and fever.  ?Eyes: Negative.   ?Respiratory:  Negative for cough, chest tightness, shortness of breath and wheezing.   ?Cardiovascular:  Negative for chest pain, palpitations and leg swelling.  ?Gastrointestinal:  Negative for abdominal pain, constipation, diarrhea, nausea and vomiting.  ?Endocrine: Negative.   ?Genitourinary: Negative.  Negative for difficulty urinating.  ?Musculoskeletal: Negative.   ?Skin: Negative.  Negative for color change.  ?Neurological:  Negative for dizziness, weakness, light-headedness and headaches.  ?Hematological:  Negative for adenopathy. Does not bruise/bleed easily.  ?Psychiatric/Behavioral: Negative.    ? ?Objective:  ?BP (!) 142/80 (BP Location: Left Arm, Patient Position: Sitting, Cuff Size: Large)   Pulse 77   Temp 98.1 ?F (36.7 ?C) (Oral)   Ht '5\' 11"'$  (1.803 m)   Wt 244 lb (110.7 kg)   SpO2  95%   BMI 34.03 kg/m?  ? ?BP Readings from Last 3 Encounters:  ?09/15/21 (!) 142/80  ?03/10/21 134/70  ?09/08/20 130/74  ? ? ?Wt Readings from Last 3 Encounters:  ?09/15/21 244 lb (110.7 kg)  ?03/10/21 242 lb (109.8 kg)  ?09/08/20 241 lb 12.8 oz (109.7 kg)  ? ? ?Physical Exam ?Vitals reviewed.  ?HENT:  ?   Nose: Nose normal.  ?   Mouth/Throat:  ?   Mouth: Mucous membranes are moist.  ?Eyes:  ?   General: No scleral icterus. ?   Conjunctiva/sclera: Conjunctivae normal.  ?Cardiovascular:  ?   Rate and Rhythm: Normal rate and regular rhythm.  ?   Heart sounds: No murmur heard. ?Pulmonary:  ?   Effort: Pulmonary effort is normal.  ?   Breath sounds: No stridor. No wheezing, rhonchi or rales.  ?Abdominal:  ?   General: Abdomen is flat and protuberant.  ?   Palpations: There is no mass.  ?   Tenderness: There is no abdominal tenderness. There is no guarding.  ?   Hernia: No hernia is present.  ?Musculoskeletal:     ?   General: Normal range of motion.  ?   Cervical back: Neck supple.  ?   Right lower leg: No edema.  ?   Left lower leg: No edema.  ?Lymphadenopathy:  ?   Cervical: No cervical adenopathy.  ?Skin: ?   General: Skin is warm and dry.  ?Neurological:  ?   General: No focal  deficit present.  ?   Mental Status: He is alert.  ?Psychiatric:     ?   Mood and Affect: Mood normal.     ?   Behavior: Behavior normal.  ? ? ?Lab Results  ?Component Value Date  ? WBC 6.5 09/15/2021  ? HGB 14.2 09/15/2021  ? HCT 43.0 09/15/2021  ? PLT 185.0 09/15/2021  ? GLUCOSE 152 (H) 09/15/2021  ? CHOL 145 03/10/2021  ? TRIG 203.0 (H) 03/10/2021  ? HDL 55.10 03/10/2021  ? LDLDIRECT 47.0 03/10/2021  ? Valentine 62 09/10/2019  ? ALT 21 03/10/2021  ? AST 26 03/10/2021  ? NA 139 09/15/2021  ? K 4.9 09/15/2021  ? CL 102 09/15/2021  ? CREATININE 1.13 09/15/2021  ? BUN 22 09/15/2021  ? CO2 29 09/15/2021  ? TSH 1.87 09/08/2020  ? PSA 0.00 (L) 09/15/2021  ? HGBA1C 6.3 09/15/2021  ? MICROALBUR 9.5 (H) 09/15/2021  ? ? ?No results found. ? ?Assessment  & Plan:  ? ?Sunny was seen today for hypertension. ? ?Diagnoses and all orders for this visit: ? ?Essential hypertension- His blood pressure is adequately well controlled. ?-     Basic metabolic panel; Future ?-     CBC with Differential/Platelet; Future ?-     CBC with Differential/Platelet ?-     Basic metabolic panel ? ?Type II diabetes mellitus with manifestations ([place])- His blood sugar is adequately well controlled. ?-     Basic metabolic panel; Future ?-     Hemoglobin A1c; Future ?-     Microalbumin / creatinine urine ratio; Future ?-     Microalbumin / creatinine urine ratio ?-     Hemoglobin A1c ?-     Basic metabolic panel ? ?B12 nutritional deficiency- B12 and folate are normal. ?-     Vitamin B12; Future ?-     CBC with Differential/Platelet; Future ?-     Folate; Future ?-     Folate ?-     CBC with Differential/Platelet ?-     Vitamin B12 ? ?BPH associated with nocturia- His PSA is undetectable. ?-     PSA; Future ?-     Urinalysis, Routine w reflex microscopic; Future ?-     Urinalysis, Routine w reflex microscopic ?-     PSA ? ? ?I am having Tayt R. Saladin maintain his multivitamin with minerals, Omega-3, Calcium-Vitamin D3, atorvastatin, Vitamin D3, hydrochlorothiazide, and irbesartan. ? ?No orders of the defined types were placed in this encounter. ? ? ? ?Follow-up: Return in about 6 months (around 03/18/2022). ? ?Scarlette Calico, MD ?

## 2021-09-15 NOTE — Patient Instructions (Signed)
Edema ? ?Edema is when you have too much fluid in your body or under your skin. Edema may make your legs, feet, and ankles swell. Swelling often happens in looser tissues, such as around your eyes. This is a common condition. It gets more common as you get older. ?There are many possible causes of edema. These include: ?Eating too much salt (sodium). ?Being on your feet or sitting for a long time. ?Certain medical conditions, such as: ?Pregnancy. ?Heart failure. ?Liver disease. ?Kidney disease. ?Cancer. ?Hot weather may make edema worse. Edema is usually painless. Your skin may look swollen or shiny. ?Follow these instructions at home: ?Medicines ?Take over-the-counter and prescription medicines only as told by your doctor. ?Your doctor may prescribe a medicine to help your body get rid of extra water (diuretic). Take this medicine if you are told to take it. ?Eating and drinking ?Eat a low-salt (low-sodium) diet as told by your doctor. Sometimes, eating less salt may reduce swelling. ?Depending on the cause of your swelling, you may need to limit how much fluid you drink (fluid restriction). ?General instructions ?Raise the injured area above the level of your heart while you are sitting or lying down. ?Do not sit still or stand for a long time. ?Do not wear tight clothes. Do not wear garters on your upper legs. ?Exercise your legs. This can help the swelling go down. ?Wear compression stockings as told by your doctor. It is important that these are the right size. These should be prescribed by your doctor to prevent possible injuries. ?If elastic bandages or wraps are recommended, use them as told by your doctor. ?Contact a doctor if: ?Treatment is not working. ?You have heart, liver, or kidney disease and have symptoms of edema. ?You have sudden and unexplained weight gain. ?Get help right away if: ?You have shortness of breath or chest pain. ?You cannot breathe when you lie down. ?You have pain, redness, or  warmth in the swollen areas. ?You have heart, liver, or kidney disease and get edema all of a sudden. ?You have a fever and your symptoms get worse all of a sudden. ?These symptoms may be an emergency. Get help right away. Call 911. ?Do not wait to see if the symptoms will go away. ?Do not drive yourself to the hospital. ?Summary ?Edema is when you have too much fluid in your body or under your skin. ?Edema may make your legs, feet, and ankles swell. Swelling often happens in looser tissues, such as around your eyes. ?Raise the injured area above the level of your heart while you are sitting or lying down. ?Follow your doctor's instructions about diet and how much fluid you can drink. ?This information is not intended to replace advice given to you by your health care provider. Make sure you discuss any questions you have with your health care provider. ?Document Revised: 01/04/2021 Document Reviewed: 01/04/2021 ?Elsevier Patient Education ? 2023 [employer] ? ?

## 2021-09-16 DIAGNOSIS — H6123 Impacted cerumen, bilateral: Secondary | ICD-10-CM | POA: Diagnosis not present

## 2021-09-23 ENCOUNTER — Other Ambulatory Visit: Payer: Self-pay | Admitting: Internal Medicine

## 2021-09-23 DIAGNOSIS — E785 Hyperlipidemia, unspecified: Secondary | ICD-10-CM

## 2021-09-23 DIAGNOSIS — I6523 Occlusion and stenosis of bilateral carotid arteries: Secondary | ICD-10-CM

## 2021-10-14 ENCOUNTER — Telehealth: Payer: Self-pay

## 2021-10-14 NOTE — Chronic Care Management (AMB) (Unsigned)
Chronic Care Management Pharmacy Assistant   Name: Louis Kent  MRN: [id number] DOB: [birth year]  Reason for Encounter: Disease State-   Conditions to be addressed/monitored: HTN   Recent office visits:  09/15/21 [name], MD-PCP (Essential Hypertension) Blood work ordered; Medication changes: none  Recent consult visits:  None since last coordination call 05/24/21  Hospital visits:  None since last coordination call 05/24/21  Medications: Outpatient Encounter Medications as of 10/14/2021  Medication Sig   atorvastatin (LIPITOR) 10 MG tablet TAKE 1 TABLET BY MOUTH EVERY DAY   Calcium Carb-Cholecalciferol (CALCIUM-VITAMIN D3) 600-400 MG-UNIT CAPS Take 1 capsule by mouth daily.   Cholecalciferol (VITAMIN D3) 50 MCG (2000 UT) TABS TAKE 1 TABLET BY MOUTH EVERY DAY   hydrochlorothiazide (HYDRODIURIL) 25 MG tablet TAKE 1 TABLET (25 MG TOTAL) BY MOUTH DAILY.   irbesartan (AVAPRO) 300 MG tablet TAKE 1 TABLET BY MOUTH EVERY DAY   Multiple Vitamins-Minerals (MULTIVITAMIN WITH MINERALS) tablet Take by mouth.   Omega-3 1400 MG CAPS Take by mouth.   No facility-administered encounter medications on file as of 10/14/2021.   Reviewed chart prior to disease state call. Spoke with patient regarding BP  Recent Office Vitals: BP Readings from Last 3 Encounters:  09/15/21 (!) 142/80  03/10/21 134/70  09/08/20 130/74   Pulse Readings from Last 3 Encounters:  09/15/21 77  03/10/21 77  09/08/20 75    Wt Readings from Last 3 Encounters:  09/15/21 244 lb (110.7 kg)  03/10/21 242 lb (109.8 kg)  09/08/20 241 lb 12.8 oz (109.7 kg)     Kidney Function Lab Results  Component Value Date/Time   CREATININE 1.13 09/15/2021 10:38 AM   CREATININE 1.03 03/10/2021 11:05 AM   GFR 60.31 09/15/2021 10:38 AM   GFRNONAA 68.61 02/04/2010 11:20 AM   GFRAA 95 09/18/2007 11:38 AM       Latest Ref Rng & Units 09/15/2021   10:38 AM 03/10/2021   11:05 AM 09/08/2020   10:29 AM  BMP  Glucose 70 - 99  mg/dL 152   90   96    BUN 6 - 23 mg/dL '22   17   18    '$ Creatinine 0.40 - 1.50 mg/dL 1.13   1.03   1.01    Sodium 135 - 145 mEq/L 139   139   140    Potassium 3.5 - 5.1 mEq/L 4.9   4.5   4.4    Chloride 96 - 112 mEq/L 102   102   103    CO2 19 - 32 mEq/L 29   31   32    Calcium 8.4 - 10.5 mg/dL 9.4   9.3   9.3      Current antihypertensive regimen:  HCTZ 25 mg daily AM Irbesartan 300 mg daily HS  How often are you checking your Blood Pressure? {CHL HP BP Monitoring Frequency:[phone]}  Current home BP readings: ***  What recent interventions/DTPs have been made by any provider to improve Blood Pressure control since last CPP Visit: ***  Any recent hospitalizations or ED visits since last visit with CPP? {yes/no:20286}  What diet changes have been made to improve Blood Pressure Control?  ***  What exercise is being done to improve your Blood Pressure Control?  ***  Adherence Review: Is the patient currently on ACE/ARB medication? {yes/no:20286} Does the patient have >5 day gap between last estimated fill dates? {yes/no:20286}   Care Gaps Colonoscopy - 10/10/07 Diabetic Foot Exam - NA Ophthalmology -  NA Dexa Scan - 05/08/03 Annual Well Visit - 09/08/20 Micro albumin - 09/15/21 Hemoglobin A1c - 09/15/21   Star Rating Drugs: Atorvastatin - last fill 03/28/21 90D Irbesartan - last fill 03/13/21 [place] Pharmacist Assistant [phone]

## 2021-11-18 ENCOUNTER — Telehealth: Payer: Medicare Other

## 2021-11-27 ENCOUNTER — Other Ambulatory Visit: Payer: Self-pay | Admitting: Internal Medicine

## 2021-11-27 DIAGNOSIS — I10 Essential (primary) hypertension: Secondary | ICD-10-CM

## 2021-12-05 ENCOUNTER — Other Ambulatory Visit: Payer: Self-pay | Admitting: Internal Medicine

## 2021-12-05 DIAGNOSIS — I10 Essential (primary) hypertension: Secondary | ICD-10-CM

## 2021-12-05 DIAGNOSIS — I6523 Occlusion and stenosis of bilateral carotid arteries: Secondary | ICD-10-CM

## 2021-12-10 ENCOUNTER — Telehealth: Payer: Medicare Other

## 2021-12-21 DIAGNOSIS — H6123 Impacted cerumen, bilateral: Secondary | ICD-10-CM | POA: Diagnosis not present

## 2022-01-06 ENCOUNTER — Telehealth: Payer: Self-pay

## 2022-01-06 NOTE — Telephone Encounter (Signed)
Pt states he is having difficulty walking due to pain in rt foot into his ankle. He states he needs someone to look at his foot cause the pain is a lot. There is swelling and pain to the touch. Foot is not warm nor any redness.  **States the diuretic is not helping with the swelling in his feet and ankle. Please call the pt with advice.

## 2022-01-07 ENCOUNTER — Ambulatory Visit (INDEPENDENT_AMBULATORY_CARE_PROVIDER_SITE_OTHER): Payer: Medicare Other | Admitting: Internal Medicine

## 2022-01-07 ENCOUNTER — Encounter: Payer: Self-pay | Admitting: Internal Medicine

## 2022-01-07 VITALS — BP 140/80 | HR 92 | Temp 98.3°F | Ht 71.0 in | Wt 245.2 lb

## 2022-01-07 DIAGNOSIS — M7989 Other specified soft tissue disorders: Secondary | ICD-10-CM | POA: Insufficient documentation

## 2022-01-07 DIAGNOSIS — I6523 Occlusion and stenosis of bilateral carotid arteries: Secondary | ICD-10-CM | POA: Diagnosis not present

## 2022-01-07 DIAGNOSIS — R7303 Prediabetes: Secondary | ICD-10-CM | POA: Diagnosis not present

## 2022-01-07 DIAGNOSIS — R0600 Dyspnea, unspecified: Secondary | ICD-10-CM

## 2022-01-07 DIAGNOSIS — I10 Essential (primary) hypertension: Secondary | ICD-10-CM

## 2022-01-07 LAB — COMPREHENSIVE METABOLIC PANEL
ALT: 25 U/L (ref 0–53)
AST: 27 U/L (ref 0–37)
Albumin: 4.1 g/dL (ref 3.5–5.2)
Alkaline Phosphatase: 91 U/L (ref 39–117)
BUN: 20 mg/dL (ref 6–23)
CO2: 29 mEq/L (ref 19–32)
Calcium: 9.3 mg/dL (ref 8.4–10.5)
Chloride: 99 mEq/L (ref 96–112)
Creatinine, Ser: 1.1 mg/dL (ref 0.40–1.50)
GFR: 62.15 mL/min (ref >60.00)
Glucose, Bld: 129 mg/dL — ABNORMAL HIGH (ref 70–99)
Potassium: 3.9 mEq/L (ref 3.5–5.1)
Sodium: 138 mEq/L (ref 135–145)
Total Bilirubin: 0.9 mg/dL (ref 0.2–1.2)
Total Protein: 7.1 g/dL (ref 6.0–8.3)

## 2022-01-07 LAB — CBC
HCT: 39.7 % (ref 39.0–52.0)
Hemoglobin: 13.3 g/dL (ref 13.0–17.0)
MCHC: 33.4 g/dL (ref 30.0–36.0)
MCV: 93.5 fl (ref 78.0–100.0)
Platelets: 221 10*3/uL (ref 150.0–400.0)
RBC: 4.25 Mil/uL (ref 4.22–5.81)
RDW: 13.3 % (ref 11.5–15.5)
WBC: 6.9 10*3/uL (ref 4.0–10.5)

## 2022-01-07 LAB — HEMOGLOBIN A1C: Hgb A1c MFr Bld: 6.7 % — ABNORMAL HIGH (ref 4.6–6.5)

## 2022-01-07 LAB — BRAIN NATRIURETIC PEPTIDE: Pro B Natriuretic peptide (BNP): 47 pg/mL (ref 0.0–100.0)

## 2022-01-07 MED ORDER — FUROSEMIDE 20 MG PO TABS: 20.0000 mg | ORAL_TABLET | Freq: Every day | ORAL | 0 refills | Status: DC

## 2022-01-07 NOTE — Assessment & Plan Note (Signed)
Checking HgA1c. Adjust as needed. 

## 2022-01-07 NOTE — Progress Notes (Signed)
   Subjective:   Patient ID: Louis Kent, male    DOB: 01-30-1941, 81 y.o.   MRN: [id number]  HPI The patient is an 81 YO man coming in for foot/leg swelling going on for a week or so. Recent trip to beach is when this started. No change in diet. No SOB on lying flat. Some more exertional dyspnea with climbing stairs.   Review of Systems  Constitutional: Negative.   HENT: Negative.    Eyes: Negative.   Respiratory:  Positive for shortness of breath. Negative for cough and chest tightness.   Cardiovascular:  Positive for leg swelling. Negative for chest pain and palpitations.  Gastrointestinal:  Negative for abdominal distention, abdominal pain, constipation, diarrhea, nausea and vomiting.  Musculoskeletal: Negative.   Skin: Negative.   Neurological: Negative.   Psychiatric/Behavioral: Negative.      Objective:  Physical Exam Constitutional:      Appearance: He is well-developed. He is obese.  HENT:     Head: Normocephalic and atraumatic.  Cardiovascular:     Rate and Rhythm: Normal rate and regular rhythm.  Pulmonary:     Effort: Pulmonary effort is normal. No respiratory distress.     Breath sounds: Normal breath sounds. No wheezing or rales.  Abdominal:     General: Bowel sounds are normal. There is no distension.     Palpations: Abdomen is soft.     Tenderness: There is no abdominal tenderness. There is no rebound.  Musculoskeletal:     Cervical back: Normal range of motion.     Right lower leg: Edema present.     Left lower leg: Edema present.     Comments: 1-2+ pitting edema to knees bilaterally  Skin:    General: Skin is warm and dry.  Neurological:     Mental Status: He is alert and oriented to person, place, and time.     Coordination: Coordination normal.     Vitals:   01/07/22 1005  BP: (!) 140/80  Pulse: 92  Temp: 98.3 F (36.8 C)  TempSrc: Oral  SpO2: 97%  Weight: 245 lb 3.2 oz (111.2 kg)  Height: '5\' 11"'$  (1.803 m)    Assessment & Plan:   Visit time 20 minutes in face to face communication with patient and coordination of care, additional 10 minutes spent in record review, coordination or care, ordering tests, communicating/referring to other healthcare professionals, documenting in medical records all on the same day of the visit for total time 30 minutes spent on the visit.

## 2022-01-07 NOTE — Assessment & Plan Note (Signed)
Checking BNP, CBC, CMP to assess for cause. Rx lasix 20 mg daily and asked to take 1 pill daily for 3-5 days. Could be related to recent trip to the beach and spends a lot of time on the computer with legs dangling. Counseled about sodium intake.

## 2022-01-07 NOTE — Patient Instructions (Signed)
We have sent in lasix (furosemide) to replace the hydrochlorothiazide to take 1 pill daily for 1 week.

## 2022-01-07 NOTE — Assessment & Plan Note (Signed)
BP borderline today. Advised to add lasix 20 mg daily for 3-5 days for swelling then resume normal meds hctz 25 mg daily and irbesartan 300 mg daily.

## 2022-01-11 NOTE — Telephone Encounter (Signed)
Pt was seen on 01/07/2022 by Dr. Sharlet Salina.

## 2022-01-27 ENCOUNTER — Ambulatory Visit: Payer: Medicare Other | Admitting: Internal Medicine

## 2022-01-29 ENCOUNTER — Other Ambulatory Visit: Payer: Self-pay | Admitting: Internal Medicine

## 2022-02-03 DIAGNOSIS — Z23 Encounter for immunization: Secondary | ICD-10-CM | POA: Diagnosis not present

## 2022-02-15 ENCOUNTER — Other Ambulatory Visit: Payer: Self-pay | Admitting: Internal Medicine

## 2022-02-15 DIAGNOSIS — E559 Vitamin D deficiency, unspecified: Secondary | ICD-10-CM

## 2022-02-24 ENCOUNTER — Other Ambulatory Visit: Payer: Self-pay | Admitting: Internal Medicine

## 2022-02-24 DIAGNOSIS — I10 Essential (primary) hypertension: Secondary | ICD-10-CM

## 2022-03-06 ENCOUNTER — Other Ambulatory Visit: Payer: Self-pay | Admitting: Internal Medicine

## 2022-03-06 DIAGNOSIS — I10 Essential (primary) hypertension: Secondary | ICD-10-CM

## 2022-03-06 DIAGNOSIS — I6523 Occlusion and stenosis of bilateral carotid arteries: Secondary | ICD-10-CM

## 2022-03-08 NOTE — Patient Instructions (Signed)
Health Maintenance, Male Adopting a healthy lifestyle and getting preventive care are important in promoting health and wellness. Ask your health care provider about: The right schedule for you to have regular tests and exams. Things you can do on your own to prevent diseases and keep yourself healthy. What should I know about diet, weight, and exercise? Eat a healthy diet  Eat a diet that includes plenty of vegetables, fruits, low-fat dairy products, and lean protein. Do not eat a lot of foods that are high in solid fats, added sugars, or sodium. Maintain a healthy weight Body mass index (BMI) is a measurement that can be used to identify possible weight problems. It estimates body fat based on height and weight. Your health care provider can help determine your BMI and help you achieve or maintain a healthy weight. Get regular exercise Get regular exercise. This is one of the most important things you can do for your health. Most adults should: Exercise for at least 150 minutes each week. The exercise should increase your heart rate and make you sweat (moderate-intensity exercise). Do strengthening exercises at least twice a week. This is in addition to the moderate-intensity exercise. Spend less time sitting. Even light physical activity can be beneficial. Watch cholesterol and blood lipids Have your blood tested for lipids and cholesterol at 83 years of age, then have this test every 5 years. You may need to have your cholesterol levels checked more often if: Your lipid or cholesterol levels are high. You are older than 83 years of age. You are at high risk for heart disease. What should I know about cancer screening? Many types of cancers can be detected early and may often be prevented. Depending on your health history and family history, you may need to have cancer screening at various ages. This may include screening for: Colorectal cancer. Prostate cancer. Skin cancer. Lung  cancer. What should I know about heart disease, diabetes, and high blood pressure? Blood pressure and heart disease High blood pressure causes heart disease and increases the risk of stroke. This is more likely to develop in people who have high blood pressure readings or are overweight. Talk with your health care provider about your target blood pressure readings. Have your blood pressure checked: Every 3-5 years if you are 18-39 years of age. Every year if you are 40 years old or older. If you are between the ages of 65 and 75 and are a current or former smoker, ask your health care provider if you should have a one-time screening for abdominal aortic aneurysm (AAA). Diabetes Have regular diabetes screenings. This checks your fasting blood sugar level. Have the screening done: Once every three years after age 45 if you are at a normal weight and have a low risk for diabetes. More often and at a younger age if you are overweight or have a high risk for diabetes. What should I know about preventing infection? Hepatitis B If you have a higher risk for hepatitis B, you should be screened for this virus. Talk with your health care provider to find out if you are at risk for hepatitis B infection. Hepatitis C Blood testing is recommended for: Everyone born from 1945 through 1965. Anyone with known risk factors for hepatitis C. Sexually transmitted infections (STIs) You should be screened each year for STIs, including gonorrhea and chlamydia, if: You are sexually active and are younger than 83 years of age. You are older than 83 years of age and your   health care provider tells you that you are at risk for this type of infection. Your sexual activity has changed since you were last screened, and you are at increased risk for chlamydia or gonorrhea. Ask your health care provider if you are at risk. Ask your health care provider about whether you are at high risk for HIV. Your health care provider  may recommend a prescription medicine to help prevent HIV infection. If you choose to take medicine to prevent HIV, you should first get tested for HIV. You should then be tested every 3 months for as long as you are taking the medicine. Follow these instructions at home: Alcohol use Do not drink alcohol if your health care provider tells you not to drink. If you drink alcohol: Limit how much you have to 0-2 drinks a day. Know how much alcohol is in your drink. In the [place] one drink equals one 12 oz bottle of beer (355 mL), one 5 oz glass of Mieko Kneebone (148 mL), or one 1 oz glass of hard liquor (44 mL). Lifestyle Do not use any products that contain nicotine or tobacco. These products include cigarettes, chewing tobacco, and vaping devices, such as e-cigarettes. If you need help quitting, ask your health care provider. Do not use street drugs. Do not share needles. Ask your health care provider for help if you need support or information about quitting drugs. General instructions Schedule regular health, dental, and eye exams. Stay current with your vaccines. Tell your health care provider if: You often feel depressed. You have ever been abused or do not feel safe at home. Summary Adopting a healthy lifestyle and getting preventive care are important in promoting health and wellness. Follow your health care provider's instructions about healthy diet, exercising, and getting tested or screened for diseases. Follow your health care provider's instructions on monitoring your cholesterol and blood pressure. This information is not intended to replace advice given to you by your health care provider. Make sure you discuss any questions you have with your health care provider. Document Revised: 09/21/2020 Document Reviewed: 09/21/2020 Elsevier Patient Education  2023 Elsevier Inc.  

## 2022-03-08 NOTE — Progress Notes (Unsigned)
Subjective:   Louis Kent is a 81 y.o. male who presents for Medicare Annual/Subsequent preventive examination. I connected with  [name] on 03/09/22 by a audio enabled telemedicine application and verified that I am speaking with the correct person using two identifiers.  Patient Location: Home  Provider Location: Home Office  I discussed the limitations of evaluation and management by telemedicine. The patient expressed understanding and agreed to proceed.  Review of Systems    Deferred to PCP Cardiac Risk Factors include: advanced age (>77mn, >>39women);diabetes mellitus;male gender;hypertension;dyslipidemia     Objective:    Today's Vitals   03/09/22 1003  PainSc: 1    There is no height or weight on file to calculate BMI.     03/09/2022   10:14 AM 12/03/2019   11:21 AM 12/26/2017    2:23 PM 12/14/2016    9:21 AM  Advanced Directives  Does Patient Have a Medical Advance Directive? Yes Yes Yes Yes  Type of AParamedicof ASan MiguelLiving will  [place] will [place] will  Does patient want to make changes to medical advance directive? No - Patient declined No - Patient declined    Copy of HHoltin Chart? No - copy requested   No - copy requested    Current Medications (verified) Outpatient Encounter Medications as of 03/09/2022  Medication Sig   atorvastatin (LIPITOR) 10 MG tablet TAKE 1 TABLET BY MOUTH EVERY DAY   Calcium Carb-Cholecalciferol (CALCIUM-VITAMIN D3) 600-400 MG-UNIT CAPS Take 1 capsule by mouth daily.   Cholecalciferol (VITAMIN D3) 50 MCG (2000 UT) TABS TAKE 1 TABLET BY MOUTH EVERY DAY   hydrochlorothiazide (HYDRODIURIL) 25 MG tablet TAKE 1 TABLET (25 MG TOTAL) BY MOUTH DAILY.   irbesartan (AVAPRO) 300 MG tablet TAKE 1 TABLET BY MOUTH EVERY DAY   Multiple Vitamins-Minerals (MULTIVITAMIN WITH MINERALS) tablet Take by mouth.   Omega-3 1400 MG CAPS  Take by mouth.   furosemide (LASIX) 20 MG tablet TAKE 1 TABLET BY MOUTH EVERY DAY (Patient not taking: Reported on 03/09/2022)   No facility-administered encounter medications on file as of 03/09/2022.    Allergies (verified) Patient has no known allergies.   History: Past Medical History:  Diagnosis Date   Hyperlipidemia    Hypertension    S/P prostatectomy    Past Surgical History:  Procedure Laterality Date   ANKLE ARTHROSCOPY W/ OPEN REPAIR     bilater hip replacements     PROSTATE SURGERY     TONSILLECTOMY     Family History  Problem Relation Age of Onset   Stroke Mother    Diabetes Mother    Lung cancer Father        lung cancer   Lung cancer Other    Social History   Socioeconomic History   Marital status: Widowed    Spouse name: Not on file   Number of children: 2   Years of education: master's   Highest education level: Not on file  Occupational History   Occupation: retired    [employer] farm manager RETIRED  Tobacco Use   Smoking status: Never   Smokeless tobacco: Never  Vaping Use   Vaping Use: Never used  Substance and Sexual Activity   Alcohol use: No   Drug use: No   Sexual activity: Not Currently  Other Topics Concern   Not on file  Social History Narrative   Not on file   Social Determinants of Health  Financial Resource Strain: Low Risk  (03/09/2022)   Overall Financial Resource Strain (CARDIA)    Difficulty of Paying Living Expenses: Not hard at all  Food Insecurity: No Food Insecurity (03/09/2022)   Hunger Vital Sign    Worried About Running Out of Food in the Last Year: Never true    Ran Out of Food in the Last Year: Never true  Transportation Needs: No Transportation Needs (03/09/2022)   PRAPARE - Hydrologist (Medical): No    Lack of Transportation (Non-Medical): No  Physical Activity: Sufficiently Active (03/09/2022)   Exercise Vital Sign    Days of Exercise per Week: 6 days    Minutes of Exercise per  Session: 40 min  Stress: No Stress Concern Present (03/09/2022)   [place]    Feeling of Stress : Not at all  Social Connections: Moderately Integrated (03/09/2022)   Social Connection and Isolation Panel [NHANES]    Frequency of Communication with Friends and Family: More than three times a week    Frequency of Social Gatherings with Friends and Family: More than three times a week    Attends Religious Services: More than 4 times per year    Active Member of [employer] or Organizations: Yes    Attends Archivist Meetings: More than 4 times per year    Marital Status: Widowed    Tobacco Counseling Counseling given: Not Answered   Clinical Intake:  Pre-visit preparation completed: Yes  Pain : 0-10 Pain Score: 1  Pain Type: Chronic pain Pain Location: Generalized Pain Descriptors / Indicators: Aching Pain Relieving Factors: stretch  Pain Relieving Factors: stretch  Nutritional Status: BMI > 30  Obese Nutritional Risks: None Diabetes: Yes CBG done?: No Did pt. bring in CBG monitor from home?: No  How often do you need to have someone help you when you read instructions, pamphlets, or other written materials from your doctor or pharmacy?: 1 - Never What is the last grade level you completed in school?: mater's  Diabetic?Yes Nutrition Risk Assessment:  Has the patient had any N/V/D within the last 2 months?  No  Does the patient have any non-healing wounds?  No  Has the patient had any unintentional weight loss or weight gain?  No   Diabetes:  Is the patient diabetic?  No  If diabetic, was a CBG obtained today?  No  Did the patient bring in their glucometer from home?  No  How often do you monitor your CBG's? Reports that he is prediabetic and will discuss with Dr. Ronnald Ramp during his upcoming visit.   Financial Strains and Diabetes Management:  Are you having any financial strains with the  device, your supplies or your medication? No .  Does the patient want to be seen by Chronic Care Management for management of their diabetes?  No  Would the patient like to be referred to a Nutritionist or for Diabetic Management?  No   Diabetic Exams:  Diabetic Eye Exam: Completed 03/10/22 Diabetic Foot Exam: Overdue, Pt has been advised about the importance in completing this exam. Pt is scheduled for diabetic foot exam on deferred to PCP.   Interpreter Needed?: No  Information entered by :: Emelia Loron RN   Activities of Daily Living    03/09/2022   10:12 AM  In your present state of health, do you have any difficulty performing the following activities:  Hearing? 1  Comment has hearing aids  Vision? 1  Difficulty concentrating or making decisions? 0  Walking or climbing stairs? 0  Dressing or bathing? 0  Doing errands, shopping? 0  Preparing Food and eating ? N  Using the Toilet? N  In the past six months, have you accidently leaked urine? N  Do you have problems with loss of bowel control? N  Managing your Medications? N  Managing your Finances? N  Housekeeping or managing your Housekeeping? N    Patient Care Team: [name], MD as PCP - General (Internal Medicine) [place], Darnelle Maffucci, [facility] (Inactive) as Pharmacist (Pharmacist)  Indicate any recent Medical Services you may have received from other than [facility] providers in the past year (date may be approximate).     Assessment:   This is a routine wellness examination for Louis Kent.  Hearing/Vision screen No results found.  Dietary issues and exercise activities discussed: Current Exercise Habits: Home exercise routine, Type of exercise: walking, Time (Minutes): 35, Frequency (Times/Week): 5, Weekly Exercise (Minutes/Week): 175, Intensity: Mild, Exercise limited by: None identified   Goals Addressed             This Visit's Progress    Patient Stated       Lose weight by being active.      Depression  Screen    03/09/2022   10:10 AM 09/15/2021   10:07 AM 09/08/2020    9:43 AM 09/10/2019   11:45 AM 06/26/2018   10:06 AM 12/26/2017    2:53 PM 12/14/2016    9:21 AM  PHQ 2/9 Scores  PHQ - 2 Score 0 0 0 0 0 0 0  PHQ- 9 Score       0    Fall Risk    03/09/2022   10:14 AM 09/15/2021   10:07 AM 09/08/2020    9:43 AM 12/03/2019   11:21 AM 09/10/2019   11:29 AM  Fall Risk   Falls in the past year? 0 0 0 0 0  Number falls in past yr: 0  0 0 0  Injury with Fall? 0  0 0 0  Risk for fall due to : No Fall Risks    No Fall Risks  Follow up Falls evaluation completed    Falls evaluation completed    FALL RISK PREVENTION PERTAINING TO THE HOME:  Any stairs in or around the home? Yes  If so, are there any without handrails? Yes  Home free of loose throw rugs in walkways, pet beds, electrical cords, etc? Yes  Adequate lighting in your home to reduce risk of falls? Yes   ASSISTIVE DEVICES UTILIZED TO PREVENT FALLS:  Life alert? No  Use of a cane, walker or w/c? No  Grab bars in the bathroom? Yes  Shower chair or bench in shower? Yes  Elevated toilet seat or a handicapped toilet? No   Cognitive Function:        03/09/2022   10:15 AM  6CIT Screen  What time? 0 points  Count back from 20 0 points  Months in reverse 0 points  Repeat phrase 0 points    Immunizations Immunization History  Administered Date(s) Administered   Fluad Quad(high Dose 65+) 01/25/2019   Influenza Split 02/17/2011, 03/22/2012, 02/23/2021   Influenza Whole 05/16/2004, 03/16/2007, 01/30/2008, 03/10/2009, 02/04/2010   Influenza, High Dose Seasonal PF 03/05/2013, 04/08/2015, 03/03/2016, 02/28/2017, 02/01/2018, 02/23/2021   Influenza,inj,Quad PF,6+ Mos 03/14/2014   PFIZER Comirnaty(Gray Top)Covid-19 Tri-Sucrose Vaccine 07/16/2019, 02/07/2020   PFIZER(Purple Top)SARS-COV-2 Vaccination 06/23/2019, 07/16/2019, 01/30/2020  Pneumococcal Conjugate-13 12/12/2013   Pneumococcal Polysaccharide-23 05/16/2004, 09/02/2014,  09/08/2020   Td 05/16/2002   Tdap 09/13/2012   Zoster Recombinat (Shingrix) 03/15/2019   Zoster, Live 02/04/2010    TDAP status: Up to date  Flu Vaccine status: Due, Education has been provided regarding the importance of this vaccine. Advised may receive this vaccine at local pharmacy or Health Dept. Aware to provide a copy of the vaccination record if obtained from local pharmacy or Health Dept. Verbalized acceptance and understanding. Patient states he had vaccination  Pneumococcal vaccine status: Up to date  Covid-19 vaccine status: Information provided on how to obtain vaccines.  Patient states he had vaccination  Qualifies for Shingles Vaccine? Yes   Zostavax completed No   Shingrix Completed?: No.    Education has been provided regarding the importance of this vaccine. Patient has been advised to call insurance company to determine out of pocket expense if they have not yet received this vaccine. Advised may also receive vaccine at local pharmacy or Health Dept. Verbalized acceptance and understanding.  Screening Tests Health Maintenance  Topic Date Due   FOOT EXAM  Never done   OPHTHALMOLOGY EXAM  Never done   Zoster Vaccines- Shingrix (2 of 2) 05/10/2019   COVID-19 Vaccine (6 - Pfizer series) 04/03/2020   INFLUENZA VACCINE  08/14/2022 (Originally 12/14/2021)   HEMOGLOBIN A1C  07/10/2022   TETANUS/TDAP  09/14/2022   Diabetic kidney evaluation - Urine ACR  09/16/2022   Diabetic kidney evaluation - GFR measurement  01/08/2023   Medicare Annual Wellness (AWV)  04/09/2023   Pneumonia Vaccine 24+ Years old  Completed   HPV VACCINES  Aged Out    Health Maintenance  Health Maintenance Due  Topic Date Due   FOOT EXAM  Never done   OPHTHALMOLOGY EXAM  Never done   Zoster Vaccines- Shingrix (2 of 2) 05/10/2019   COVID-19 Vaccine (6 - Pfizer series) 04/03/2020    Colorectal cancer screening: No longer required.   Lung Cancer Screening: (Low Dose CT Chest recommended if Age  71-80 years, 30 pack-year currently smoking OR have quit w/in 15years.) does not qualify.   Additional Screening:  Hepatitis C Screening: does qualify; Completed education provided  Vision Screening: Recommended annual ophthalmology exams for early detection of glaucoma and other disorders of the eye. Is the patient up to date with their annual eye exam?  Yes  Who is the provider or what is the name of the office in which the patient attends annual eye exams? Dr. Gershon Crane If pt is not established with a provider, would they like to be referred to a provider to establish care?  N/A .   Dental Screening: Recommended annual dental exams for proper oral hygiene  Community Resource Referral / Chronic Care Management: CRR required this visit?  No   CCM required this visit?  No      Plan:     I have personally reviewed and noted the following in the patient's chart:   Medical and social history Use of alcohol, tobacco or illicit drugs  Current medications and supplements including opioid prescriptions. Patient is not currently taking opioid prescriptions. Functional ability and status Nutritional status Physical activity Advanced directives List of other physicians Hospitalizations, surgeries, and ER visits in previous 12 months Vitals Screenings to include cognitive, depression, and falls Referrals and appointments  In addition, I have reviewed and discussed with patient certain preventive protocols, quality metrics, and best practice recommendations. A written personalized care plan for preventive services  as well as general preventive health recommendations were provided to patient.     Michiel Cowboy, RN   03/09/2022   Nurse Notes:  Louis Kent , Thank you for taking time to come for your Medicare Wellness Visit. I appreciate your ongoing commitment to your health goals. Please review the following plan we discussed and let me know if I can assist you in the future.   These are  the goals we discussed:  Goals      Manage My Medicine     Timeframe:  Long-Range Goal Priority:  Medium Start Date:        11/19/20                     Expected End Date:    11/19/21                   Follow Up Date Jan 2023   - call for medicine refill 2 or 3 days before it runs out - call if I am sick and can't take my medicine - keep a list of all the medicines I take; vitamins and herbals too    Why is this important?   These steps will help you keep on track with your medicines.   Notes:      Patient Stated     Lose weight by being active.        This is a list of the screening recommended for you and due dates:  Health Maintenance  Topic Date Due   Complete foot exam   Never done   Eye exam for diabetics  Never done   Zoster (Shingles) Vaccine (2 of 2) 05/10/2019   COVID-19 Vaccine (6 - Pfizer series) 04/03/2020   Flu Shot  08/14/2022*   Hemoglobin A1C  07/10/2022   Tetanus Vaccine  09/14/2022   Yearly kidney health urinalysis for diabetes  09/16/2022   Yearly kidney function blood test for diabetes  01/08/2023   Medicare Annual Wellness Visit  04/09/2023   Pneumonia Vaccine  Completed   HPV Vaccine  Aged Out  *Topic was postponed. The date shown is not the original due date.

## 2022-03-09 ENCOUNTER — Ambulatory Visit (INDEPENDENT_AMBULATORY_CARE_PROVIDER_SITE_OTHER): Payer: Medicare Other | Admitting: *Deleted

## 2022-03-09 DIAGNOSIS — Z0000 Encounter for general adult medical examination without abnormal findings: Secondary | ICD-10-CM | POA: Diagnosis not present

## 2022-03-10 DIAGNOSIS — H25013 Cortical age-related cataract, bilateral: Secondary | ICD-10-CM | POA: Diagnosis not present

## 2022-03-10 DIAGNOSIS — H2513 Age-related nuclear cataract, bilateral: Secondary | ICD-10-CM | POA: Diagnosis not present

## 2022-03-10 DIAGNOSIS — H40003 Preglaucoma, unspecified, bilateral: Secondary | ICD-10-CM | POA: Diagnosis not present

## 2022-03-19 ENCOUNTER — Other Ambulatory Visit: Payer: Self-pay | Admitting: Internal Medicine

## 2022-03-19 DIAGNOSIS — E785 Hyperlipidemia, unspecified: Secondary | ICD-10-CM

## 2022-03-19 DIAGNOSIS — I6523 Occlusion and stenosis of bilateral carotid arteries: Secondary | ICD-10-CM

## 2022-03-23 DIAGNOSIS — H6123 Impacted cerumen, bilateral: Secondary | ICD-10-CM | POA: Diagnosis not present

## 2022-04-06 ENCOUNTER — Ambulatory Visit (INDEPENDENT_AMBULATORY_CARE_PROVIDER_SITE_OTHER): Payer: Medicare Other | Admitting: Internal Medicine

## 2022-04-06 ENCOUNTER — Encounter: Payer: Self-pay | Admitting: Internal Medicine

## 2022-04-06 VITALS — BP 156/82 | HR 91 | Temp 98.1°F | Resp 16 | Ht 71.0 in | Wt 243.0 lb

## 2022-04-06 DIAGNOSIS — R600 Localized edema: Secondary | ICD-10-CM | POA: Diagnosis not present

## 2022-04-06 DIAGNOSIS — E118 Type 2 diabetes mellitus with unspecified complications: Secondary | ICD-10-CM

## 2022-04-06 DIAGNOSIS — I10 Essential (primary) hypertension: Secondary | ICD-10-CM

## 2022-04-06 DIAGNOSIS — I6523 Occlusion and stenosis of bilateral carotid arteries: Secondary | ICD-10-CM | POA: Diagnosis not present

## 2022-04-06 DIAGNOSIS — E538 Deficiency of other specified B group vitamins: Secondary | ICD-10-CM | POA: Diagnosis not present

## 2022-04-06 LAB — CBC WITH DIFFERENTIAL/PLATELET
Basophils Absolute: 0.1 10*3/uL (ref 0.0–0.1)
Basophils Relative: 0.7 % (ref 0.0–3.0)
Eosinophils Absolute: 0.2 10*3/uL (ref 0.0–0.7)
Eosinophils Relative: 3.1 % (ref 0.0–5.0)
HCT: 43.3 % (ref 39.0–52.0)
Hemoglobin: 14.3 g/dL (ref 13.0–17.0)
Lymphocytes Relative: 22.2 % (ref 12.0–46.0)
Lymphs Abs: 1.8 10*3/uL (ref 0.7–4.0)
MCHC: 33 g/dL (ref 30.0–36.0)
MCV: 93 fl (ref 78.0–100.0)
Monocytes Absolute: 0.8 10*3/uL (ref 0.1–1.0)
Monocytes Relative: 10.1 % (ref 3.0–12.0)
Neutro Abs: 5.2 10*3/uL (ref 1.4–7.7)
Neutrophils Relative %: 63.9 % (ref 43.0–77.0)
Platelets: 207 10*3/uL (ref 150.0–400.0)
RBC: 4.65 Mil/uL (ref 4.22–5.81)
RDW: 13.8 % (ref 11.5–15.5)
WBC: 8.1 10*3/uL (ref 4.0–10.5)

## 2022-04-06 LAB — HEPATIC FUNCTION PANEL
ALT: 27 U/L (ref 0–53)
AST: 28 U/L (ref 0–37)
Albumin: 4.3 g/dL (ref 3.5–5.2)
Alkaline Phosphatase: 92 U/L (ref 39–117)
Bilirubin, Direct: 0.1 mg/dL (ref 0.0–0.3)
Total Bilirubin: 0.7 mg/dL (ref 0.2–1.2)
Total Protein: 7.1 g/dL (ref 6.0–8.3)

## 2022-04-06 LAB — TSH: TSH: 2.36 u[IU]/mL (ref 0.35–5.50)

## 2022-04-06 LAB — URINALYSIS, ROUTINE W REFLEX MICROSCOPIC
Bilirubin Urine: NEGATIVE
Hgb urine dipstick: NEGATIVE
Ketones, ur: NEGATIVE
Leukocytes,Ua: NEGATIVE
Nitrite: NEGATIVE
RBC / HPF: NONE SEEN (ref 0–?)
Specific Gravity, Urine: 1.01 (ref 1.000–1.030)
Total Protein, Urine: NEGATIVE
Urine Glucose: NEGATIVE
Urobilinogen, UA: 0.2 (ref 0.0–1.0)
WBC, UA: NONE SEEN (ref 0–?)
pH: 7 (ref 5.0–8.0)

## 2022-04-06 LAB — HEMOGLOBIN A1C: Hgb A1c MFr Bld: 6.8 % — ABNORMAL HIGH (ref 4.6–6.5)

## 2022-04-06 LAB — BRAIN NATRIURETIC PEPTIDE: Pro B Natriuretic peptide (BNP): 15 pg/mL (ref 0.0–100.0)

## 2022-04-06 LAB — TROPONIN I (HIGH SENSITIVITY): High Sens Troponin I: 8 ng/L (ref 2–17)

## 2022-04-06 MED ORDER — TORSEMIDE 20 MG PO TABS: 20.0000 mg | ORAL_TABLET | Freq: Every day | ORAL | 0 refills | Status: DC

## 2022-04-06 NOTE — Patient Instructions (Signed)
Edema  Edema is an abnormal buildup of fluids in the body tissues and under the skin. Swelling of the legs, feet, and ankles is a common symptom that becomes more likely as you get older. Swelling is also common in looser tissues, such as around the eyes. Pressing on the area may make a temporary dent in your skin (pitting edema). This fluid may also accumulate in your lungs (pulmonary edema). There are many possible causes of edema. Eating too much salt (sodium) and being on your feet or sitting for a long time can cause edema in your legs, feet, and ankles. Common causes of edema include: Certain medical conditions, such as heart failure, liver or kidney disease, and cancer. Weak leg blood vessels. An injury. Pregnancy. Medicines. Being obese. Low protein levels in the blood. Hot weather may make edema worse. Edema is usually painless. Your skin may look swollen or shiny. Follow these instructions at home: Medicines Take over-the-counter and prescription medicines only as told by your health care provider. Your health care provider may prescribe a medicine to help your body get rid of extra water (diuretic). Take this medicine if you are told to take it. Eating and drinking Eat a low-salt (low-sodium) diet to reduce fluid as told by your health care provider. Sometimes, eating less salt may reduce swelling. Depending on the cause of your swelling, you may need to limit how much fluid you drink (fluid restriction). General instructions Raise (elevate) the injured area above the level of your heart while you are sitting or lying down. Do not sit still or stand for long periods of time. Do not wear tight clothing. Do not wear garters on your upper legs. Exercise your legs to get your circulation going. This helps to move the fluid back into your blood vessels, and it may help the swelling go down. Wear compression stockings as told by your health care provider. These stockings help to prevent  blood clots and reduce swelling in your legs. It is important that these are the correct size. These stockings should be prescribed by your health care provider to prevent possible injuries. If elastic bandages or wraps are recommended, use them as told by your health care provider. Contact a health care provider if: Your edema does not get better with treatment. You have heart, liver, or kidney disease and have symptoms of edema. You have sudden and unexplained weight gain. Get help right away if: You develop shortness of breath or chest pain. You cannot breathe when you lie down. You develop pain, redness, or warmth in the swollen areas. You have heart, liver, or kidney disease and suddenly get edema. You have a fever and your symptoms suddenly get worse. These symptoms may be an emergency. Get help right away. Call 911. Do not wait to see if the symptoms will go away. Do not drive yourself to the hospital. Summary Edema is an abnormal buildup of fluids in the body tissues and under the skin. Eating too much salt (sodium)and being on your feet or sitting for a long time can cause edema in your legs, feet, and ankles. Raise (elevate) the injured area above the level of your heart while you are sitting or lying down. Follow your health care provider's instructions about diet and how much fluid you can drink. This information is not intended to replace advice given to you by your health care provider. Make sure you discuss any questions you have with your health care provider. Document Revised: 01/04/2021 Document   Reviewed: 01/04/2021 Elsevier Patient Education  2023 Elsevier Inc.  

## 2022-04-06 NOTE — Progress Notes (Signed)
Subjective:  Louis ID: Louis Kent, male    DOB: [birth year]  Age: 81 y.o. MRN: [id number]  CC: Hypertension and Diabetes   HPI Louis Kent presents for f/up -  He walks up to 7000 steps a day.  His endurance is good.  He denies chest pain, shortness of breath, or diaphoresis.  He complains of persistent lower extremity edema.  He takes hydrochlorothiazide and someone else recently added a loop diuretic which he is no longer Kent.  Outpatient Medications Prior to Visit  Medication Sig Dispense Refill   atorvastatin (LIPITOR) 10 MG tablet TAKE 1 TABLET BY MOUTH EVERY DAY 90 tablet 1   Calcium Carb-Cholecalciferol (CALCIUM-VITAMIN D3) 600-400 MG-UNIT CAPS Take 1 capsule by mouth daily.     Cholecalciferol (VITAMIN D3) 50 MCG (2000 UT) capsule Take 2,000 Units by mouth daily.     Cholecalciferol (VITAMIN D3) 50 MCG (2000 UT) TABS TAKE 1 TABLET BY MOUTH EVERY DAY 90 tablet 1   irbesartan (AVAPRO) 300 MG tablet TAKE 1 TABLET BY MOUTH EVERY DAY 90 tablet 0   Multiple Vitamins-Minerals (MULTIVITAMIN WITH MINERALS) tablet Take by mouth.     Omega-3 1400 MG CAPS Take by mouth.     hydrochlorothiazide (HYDRODIURIL) 25 MG tablet TAKE 1 TABLET (25 MG TOTAL) BY MOUTH DAILY. 90 tablet 0   furosemide (LASIX) 20 MG tablet TAKE 1 TABLET BY MOUTH EVERY DAY (Louis Kent: Reported on 03/09/2022) 90 tablet 0   No facility-administered medications prior to visit.    ROS Review of Systems  Constitutional:  Negative for diaphoresis, fatigue and unexpected weight change.  HENT: Negative.    Eyes: Negative.   Respiratory:  Negative for chest tightness, shortness of breath and wheezing.   Cardiovascular:  Positive for leg swelling. Negative for chest pain and palpitations.  Gastrointestinal: Negative.  Negative for diarrhea, nausea and vomiting.  Endocrine: Negative.   Genitourinary: Negative.  Negative for difficulty urinating.  Musculoskeletal:  Positive for arthralgias. Negative for  myalgias.  Skin: Negative.   Allergic/Immunologic: Negative.   Neurological: Negative.  Negative for dizziness.  Hematological:  Negative for adenopathy. Does not bruise/bleed easily.  Psychiatric/Behavioral: Negative.      Objective:  BP (!) 156/82 (BP Location: Right Arm, Louis Position: Sitting, Cuff Size: Normal)   Pulse 91   Temp 98.1 F (36.7 C) (Oral)   Resp 16   Ht '5\' 11"'$  (1.803 m)   Wt 243 lb (110.2 kg)   SpO2 93%   BMI 33.89 kg/m   BP Readings from Last 3 Encounters:  04/06/22 (!) 156/82  01/07/22 (!) 140/80  09/15/21 (!) 142/80    Wt Readings from Last 3 Encounters:  04/06/22 243 lb (110.2 kg)  01/07/22 245 lb 3.2 oz (111.2 kg)  09/15/21 244 lb (110.7 kg)    Physical Exam Vitals reviewed.  HENT:     Mouth/Throat:     Mouth: Mucous membranes are moist.  Eyes:     General: No scleral icterus.    Conjunctiva/sclera: Conjunctivae normal.  Cardiovascular:     Rate and Rhythm: Normal rate and regular rhythm.     Heart sounds: Normal heart sounds and S2 normal. No murmur heard.    No gallop.     Comments: EKG - NSR, 78 bpm Normal EKG Pulmonary:     Breath sounds: No stridor. No wheezing, rhonchi or rales.  Abdominal:     General: Abdomen is protuberant. Bowel sounds are normal.     Palpations: There is  no hepatomegaly, splenomegaly or mass.     Tenderness: There is no abdominal tenderness.  Musculoskeletal:     Cervical back: Neck supple.     Right lower leg: 1+ Pitting Edema present.     Left lower leg: 1+ Pitting Edema present.  Lymphadenopathy:     Cervical: No cervical adenopathy.  Skin:    General: Skin is warm and dry.  Neurological:     General: No focal deficit present.     Mental Status: He is alert.  Psychiatric:        Mood and Affect: Mood normal.        Behavior: Behavior normal.     Lab Results  Component Value Date   WBC 8.1 04/06/2022   HGB 14.3 04/06/2022   HCT 43.3 04/06/2022   PLT 207.0 04/06/2022   GLUCOSE 129 (H)  01/07/2022   CHOL 145 03/10/2021   TRIG 203.0 (H) 03/10/2021   HDL 55.10 03/10/2021   LDLDIRECT 47.0 03/10/2021   LDLCALC 62 09/10/2019   ALT 27 04/06/2022   AST 28 04/06/2022   NA 138 01/07/2022   K 3.9 01/07/2022   CL 99 01/07/2022   CREATININE 1.10 01/07/2022   BUN 20 01/07/2022   CO2 29 01/07/2022   TSH 2.36 04/06/2022   PSA 0.00 (L) 09/15/2021   HGBA1C 6.8 (H) 04/06/2022   MICROALBUR 9.5 (H) 09/15/2021    No results found.  Assessment & Plan:   Louis Kent was seen today for hypertension and diabetes.  Diagnoses and all orders for this visit:  Bilateral leg edema- Labs and EKG are reassuring.  I think this is caused by obesity.  I recommended that he take a loop diuretic. -     EKG 12-Lead -     Brain natriuretic peptide; Future -     Troponin I (High Sensitivity); Future -     TSH; Future -     Urinalysis, Routine w reflex microscopic; Future -     Hepatic function panel; Future -     Hepatic function panel -     Urinalysis, Routine w reflex microscopic -     TSH -     Troponin I (High Sensitivity) -     Brain natriuretic peptide -     torsemide (DEMADEX) 20 MG tablet; Take 1 tablet (20 mg total) by mouth daily.  Essential hypertension- His blood pressure is not adequately well-controlled.  Will change the thiazide diuretic to a loop diuretic. -     EKG 12-Lead -     TSH; Future -     Urinalysis, Routine w reflex microscopic; Future -     Hepatic function panel; Future -     CBC with Differential/Platelet; Future -     CBC with Differential/Platelet -     Hepatic function panel -     Urinalysis, Routine w reflex microscopic -     TSH -     torsemide (DEMADEX) 20 MG tablet; Take 1 tablet (20 mg total) by mouth daily.  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  I recommended that he start Kent an SGLT2 inhibitor. -     Hemoglobin A1c; Future -     Hemoglobin A1c -     dapagliflozin propanediol (FARXIGA) 10 MG TABS tablet;  Take 1 tablet (10 mg total) by mouth daily before breakfast.  B12 nutritional deficiency -     CBC with Differential/Platelet; Future -     CBC with Differential/Platelet  I have discontinued Louis Kent's furosemide and hydrochlorothiazide. I am also having him start on torsemide and dapagliflozin propanediol. Additionally, I am having him maintain his multivitamin with minerals, Omega-3, Calcium-Vitamin D3, Vitamin D3, irbesartan, atorvastatin, and Vitamin D3.  Meds ordered this encounter  Medications   torsemide (DEMADEX) 20 MG tablet    Sig: Take 1 tablet (20 mg total) by mouth daily.    Dispense:  90 tablet    Refill:  0   dapagliflozin propanediol (FARXIGA) 10 MG TABS tablet    Sig: Take 1 tablet (10 mg total) by mouth daily before breakfast.    Dispense:  90 tablet    Refill:  1     Follow-up: Return in about 3 months (around 07/07/2022).  Scarlette Calico, MD

## 2022-04-07 MED ORDER — DAPAGLIFLOZIN PROPANEDIOL 10 MG PO TABS: 10.0000 mg | ORAL_TABLET | Freq: Every day | ORAL | 1 refills | Status: DC

## 2022-05-23 ENCOUNTER — Other Ambulatory Visit: Payer: Self-pay | Admitting: Internal Medicine

## 2022-05-23 DIAGNOSIS — I10 Essential (primary) hypertension: Secondary | ICD-10-CM

## 2022-06-02 ENCOUNTER — Other Ambulatory Visit: Payer: Self-pay | Admitting: Internal Medicine

## 2022-06-02 DIAGNOSIS — I6523 Occlusion and stenosis of bilateral carotid arteries: Secondary | ICD-10-CM

## 2022-06-02 DIAGNOSIS — I10 Essential (primary) hypertension: Secondary | ICD-10-CM

## 2022-06-17 DIAGNOSIS — H6123 Impacted cerumen, bilateral: Secondary | ICD-10-CM | POA: Diagnosis not present

## 2022-06-29 DIAGNOSIS — Z85828 Personal history of other malignant neoplasm of skin: Secondary | ICD-10-CM | POA: Diagnosis not present

## 2022-06-29 DIAGNOSIS — C4441 Basal cell carcinoma of skin of scalp and neck: Secondary | ICD-10-CM | POA: Diagnosis not present

## 2022-06-29 DIAGNOSIS — L821 Other seborrheic keratosis: Secondary | ICD-10-CM | POA: Diagnosis not present

## 2022-06-29 DIAGNOSIS — D485 Neoplasm of uncertain behavior of skin: Secondary | ICD-10-CM | POA: Diagnosis not present

## 2022-06-29 DIAGNOSIS — L570 Actinic keratosis: Secondary | ICD-10-CM | POA: Diagnosis not present

## 2022-07-14 ENCOUNTER — Encounter: Payer: Self-pay | Admitting: Family Medicine

## 2022-07-14 ENCOUNTER — Ambulatory Visit (INDEPENDENT_AMBULATORY_CARE_PROVIDER_SITE_OTHER): Payer: Medicare Other | Admitting: Family Medicine

## 2022-07-14 VITALS — BP 140/66 | HR 73 | Temp 97.6°F | Ht 71.0 in | Wt 232.6 lb

## 2022-07-14 DIAGNOSIS — M545 Low back pain, unspecified: Secondary | ICD-10-CM | POA: Diagnosis not present

## 2022-07-14 DIAGNOSIS — I6523 Occlusion and stenosis of bilateral carotid arteries: Secondary | ICD-10-CM

## 2022-07-14 NOTE — Progress Notes (Signed)
Established Patient Office Visit   Subjective  Patient ID: Louis Kent, male    DOB: [birth year]  Age: 82 y.o. MRN: [id number]  Chief Complaint  Patient presents with   Back Pain    Patient complains of back pain, x1 week, Tried Advil with little relief     Pt is an 82 yo with  has a past medical history of Hyperlipidemia, Hypertension, and S/P prostatectomy followed by Dr. Ronnald Ramp and seen for acute concern.  Bilateral low back pain x 1 wk.  No midline pain.  Worse in am then better throughout the day.  Currently a dull pain.  Shap in am when trying to roll out of bed.  Sleeping on a pillow top mattress that is at least 84 yrs old.  Tried heat and advil which helps, but pt didn't want to take it several times per day.  States has done extensive research online and feels it is msk.  Saw it could last wks, but wanted to come in as this is new for him.  Denies pain that moves, LE weakness, urinary symptoms, n/v.     Pt was told he could not be seen by pcp until the end of March.         ROS Negative unless stated above    Objective:     BP (!) 140/66 (BP Location: Left Arm, Patient Position: Sitting, Cuff Size: Large)   Pulse 73   Temp 97.6 F (36.4 C) (Oral)   Ht '5\' 11"'$  (1.803 m)   Wt 232 lb 9.6 oz (105.5 kg)   SpO2 98%   BMI 32.44 kg/m    Physical Exam Constitutional:      General: He is not in acute distress.    Appearance: Normal appearance.  HENT:     Head: Normocephalic and atraumatic.     Nose: Nose normal.     Mouth/Throat:     Mouth: Mucous membranes are moist.     Pharynx: No oropharyngeal exudate or posterior oropharyngeal erythema.  Eyes:     General: No scleral icterus.    Extraocular Movements: Extraocular movements intact.     Conjunctiva/sclera: Conjunctivae normal.     Pupils: Pupils are equal, round, and reactive to light.  Neck:     Thyroid: No thyromegaly.  Cardiovascular:     Rate and Rhythm: Normal rate and regular rhythm.     Heart  sounds: No murmur heard.    No friction rub. No gallop.  Pulmonary:     Effort: Pulmonary effort is normal. No respiratory distress.     Breath sounds: Normal breath sounds. No wheezing, rhonchi or rales.  Abdominal:     Tenderness: There is no right CVA tenderness or left CVA tenderness.  Musculoskeletal:        General: No deformity. Normal range of motion.     Comments: No TTP of cervical, thoracic, lumbar, or paraspinal muscles.  Lymphadenopathy:     Cervical: No cervical adenopathy.  Skin:    General: Skin is warm and dry.     Findings: No lesion.  Neurological:     General: No focal deficit present.     Mental Status: He is alert and oriented to person, place, and time.  Psychiatric:        Mood and Affect: Mood normal.        Thought Content: Thought content normal.      No results found for any visits on 07/14/22.    Assessment &  Plan:  Acute bilateral low back pain without sciatica  Symptoms likely 2/2 musculoskeletal cause.  Arthritis could also be contributing.  Per chart review lumbar spine x-ray in 2010 with degenerative disc disease patient advised to try sleeping in a different bedroom/get a new mattress.  Continue supportive care including heat, ice, massage, topical analgesics, Tylenol or NSAIDs as needed.  Follow-up with PCP as needed  Billie Ruddy, MD

## 2022-08-25 ENCOUNTER — Other Ambulatory Visit: Payer: Self-pay | Admitting: Internal Medicine

## 2022-08-25 DIAGNOSIS — E559 Vitamin D deficiency, unspecified: Secondary | ICD-10-CM

## 2022-08-26 ENCOUNTER — Other Ambulatory Visit: Payer: Self-pay | Admitting: Internal Medicine

## 2022-08-26 DIAGNOSIS — I6523 Occlusion and stenosis of bilateral carotid arteries: Secondary | ICD-10-CM

## 2022-08-26 DIAGNOSIS — I10 Essential (primary) hypertension: Secondary | ICD-10-CM

## 2022-08-26 DIAGNOSIS — R600 Localized edema: Secondary | ICD-10-CM

## 2022-08-26 DIAGNOSIS — E785 Hyperlipidemia, unspecified: Secondary | ICD-10-CM

## 2022-09-08 DIAGNOSIS — H40003 Preglaucoma, unspecified, bilateral: Secondary | ICD-10-CM | POA: Diagnosis not present

## 2022-09-13 DIAGNOSIS — L821 Other seborrheic keratosis: Secondary | ICD-10-CM | POA: Diagnosis not present

## 2022-09-13 DIAGNOSIS — Z85828 Personal history of other malignant neoplasm of skin: Secondary | ICD-10-CM | POA: Diagnosis not present

## 2022-09-13 DIAGNOSIS — L570 Actinic keratosis: Secondary | ICD-10-CM | POA: Diagnosis not present

## 2022-09-26 DIAGNOSIS — H6123 Impacted cerumen, bilateral: Secondary | ICD-10-CM | POA: Diagnosis not present

## 2022-11-21 ENCOUNTER — Other Ambulatory Visit: Payer: Self-pay | Admitting: Internal Medicine

## 2022-11-21 DIAGNOSIS — I10 Essential (primary) hypertension: Secondary | ICD-10-CM

## 2022-11-21 DIAGNOSIS — I6523 Occlusion and stenosis of bilateral carotid arteries: Secondary | ICD-10-CM

## 2022-11-21 DIAGNOSIS — R600 Localized edema: Secondary | ICD-10-CM

## 2022-12-23 ENCOUNTER — Ambulatory Visit (INDEPENDENT_AMBULATORY_CARE_PROVIDER_SITE_OTHER): Payer: Medicare Other | Admitting: Family Medicine

## 2022-12-23 ENCOUNTER — Ambulatory Visit (INDEPENDENT_AMBULATORY_CARE_PROVIDER_SITE_OTHER): Payer: Medicare Other

## 2022-12-23 ENCOUNTER — Encounter: Payer: Self-pay | Admitting: Family Medicine

## 2022-12-23 VITALS — BP 130/82 | HR 95 | Temp 97.8°F | Ht 71.0 in | Wt 225.0 lb

## 2022-12-23 DIAGNOSIS — M4316 Spondylolisthesis, lumbar region: Secondary | ICD-10-CM | POA: Diagnosis not present

## 2022-12-23 DIAGNOSIS — M5136 Other intervertebral disc degeneration, lumbar region: Secondary | ICD-10-CM | POA: Diagnosis not present

## 2022-12-23 DIAGNOSIS — M25512 Pain in left shoulder: Secondary | ICD-10-CM | POA: Diagnosis not present

## 2022-12-23 DIAGNOSIS — M545 Low back pain, unspecified: Secondary | ICD-10-CM

## 2022-12-23 DIAGNOSIS — M47816 Spondylosis without myelopathy or radiculopathy, lumbar region: Secondary | ICD-10-CM | POA: Diagnosis not present

## 2022-12-23 DIAGNOSIS — Z96643 Presence of artificial hip joint, bilateral: Secondary | ICD-10-CM | POA: Diagnosis not present

## 2022-12-23 LAB — URINALYSIS, ROUTINE W REFLEX MICROSCOPIC
Bilirubin Urine: NEGATIVE
Hgb urine dipstick: NEGATIVE
Ketones, ur: NEGATIVE
Leukocytes,Ua: NEGATIVE
Nitrite: NEGATIVE
RBC / HPF: NONE SEEN (ref 0–?)
Specific Gravity, Urine: 1.015 (ref 1.000–1.030)
Total Protein, Urine: NEGATIVE
Urine Glucose: NEGATIVE
Urobilinogen, UA: 0.2 (ref 0.0–1.0)
WBC, UA: NONE SEEN (ref 0–?)
pH: 5.5 (ref 5.0–8.0)

## 2022-12-23 MED ORDER — MELOXICAM 7.5 MG PO TABS
7.5000 mg | ORAL_TABLET | Freq: Every day | ORAL | 0 refills | Status: AC
Start: 2022-12-23 — End: ?

## 2022-12-23 NOTE — Patient Instructions (Signed)
Please go downstairs for a urine test and X ray of your lumbar spine.   Take meloxicam daily with food x 1-2 weeks.   You may also take Tylenol 500 or 1,000 mg once or twice daily.   Use a heating pad and topical medication/patch over the counter as well.   Follow up with Dr. Yetta Barre for your chronic health conditions.

## 2022-12-23 NOTE — Progress Notes (Signed)
Subjective:     Patient ID: Louis Kent, male    DOB: 1940/07/11, 82 y.o.   MRN: [id number]  Chief Complaint  Patient presents with   Back Pain    This week on Monday started having lower back pain, thinks it may be muscular. Taking tylenol a couple times a day which helps calm some but still having issues sleeping.   Shoulder Pain    Left arm shoulder pain started around Tuesday this week. Hard to raise his hand and reports he feels a little lump    HPI  Discussed the use of AI scribe software for clinical note transcription with the patient, who gave verbal consent to proceed.  History of Present Illness         C/o low back pain x 5 days. Denies injury. No numbness, tingling or radiation.  He also has left shoulder pain with certain movements, no injury.   Taking Tylenol. Using a heating pad.     Health Maintenance Due  Topic Date Due   OPHTHALMOLOGY EXAM  Never done   Zoster Vaccines- Shingrix (2 of 2) 05/10/2019   COVID-19 Vaccine (5 - 2023-24 season) 01/14/2022   DTaP/Tdap/Td (3 - Td or Tdap) 09/14/2022   Diabetic kidney evaluation - Urine ACR  09/16/2022   HEMOGLOBIN A1C  10/05/2022   INFLUENZA VACCINE  12/15/2022   Diabetic kidney evaluation - eGFR measurement  01/08/2023    Past Medical History:  Diagnosis Date   Hyperlipidemia    Hypertension    S/P prostatectomy     Past Surgical History:  Procedure Laterality Date   ANKLE ARTHROSCOPY W/ OPEN REPAIR     bilater hip replacements     PROSTATE SURGERY     TONSILLECTOMY      Family History  Problem Relation Age of Onset   Stroke Mother    Diabetes Mother    Lung cancer Father        lung cancer   Lung cancer Other     Social History   Socioeconomic History   Marital status: Widowed    Spouse name: Not on file   Number of children: 2   Years of education: master's   Highest education level: Not on file  Occupational History   Occupation: retired    Associate Professor: RETIRED  Tobacco Use    Smoking status: Never   Smokeless tobacco: Never  Vaping Use   Vaping status: Never Used  Substance and Sexual Activity   Alcohol use: No   Drug use: No   Sexual activity: Not Currently  Other Topics Concern   Not on file  Social History Narrative   Not on file   Social Determinants of Health   Financial Resource Strain: Low Risk  (03/09/2022)   Overall Financial Resource Strain (CARDIA)    Difficulty of Paying Living Expenses: Not hard at all  Food Insecurity: No Food Insecurity (03/09/2022)   Hunger Vital Sign    Worried About Running Out of Food in the Last Year: Never true    Ran Out of Food in the Last Year: Never true  Transportation Needs: No Transportation Needs (03/09/2022)   PRAPARE - Administrator, Civil Service (Medical): No    Lack of Transportation (Non-Medical): No  Physical Activity: Sufficiently Active (03/09/2022)   Exercise Vital Sign    Days of Exercise per Week: 6 days    Minutes of Exercise per Session: 40 min  Stress: No Stress Concern Present (03/09/2022)  [employer] of Occupational Health - Occupational Stress Questionnaire    Feeling of Stress : Not at all  Social Connections: Moderately Integrated (03/09/2022)   Social Connection and Isolation Panel [NHANES]    Frequency of Communication with Friends and Family: More than three times a week    Frequency of Social Gatherings with Friends and Family: More than three times a week    Attends Religious Services: More than 4 times per year    Active Member of [employer] or Organizations: Yes    Attends Banker Meetings: More than 4 times per year    Marital Status: Widowed  Intimate Partner Violence: Not At Risk (03/09/2022)   Humiliation, Afraid, Rape, and Kick questionnaire    Fear of Current or Ex-Partner: No    Emotionally Abused: No    Physically Abused: No    Sexually Abused: No    Outpatient Medications Prior to Visit  Medication Sig Dispense Refill    atorvastatin (LIPITOR) 10 MG tablet TAKE 1 TABLET BY MOUTH EVERY DAY 90 tablet 1   Calcium Carb-Cholecalciferol (CALCIUM-VITAMIN D3) 600-400 MG-UNIT CAPS Take 1 capsule by mouth daily.     Cholecalciferol (VITAMIN D3) 50 MCG (2000 UT) capsule Take 2,000 Units by mouth daily.     irbesartan (AVAPRO) 300 MG tablet TAKE 1 TABLET BY MOUTH EVERY DAY 90 tablet 0   Multiple Vitamins-Minerals (MULTIVITAMIN WITH MINERALS) tablet Take by mouth.     Omega-3 1400 MG CAPS Take by mouth.     torsemide (DEMADEX) 20 MG tablet TAKE 1 TABLET BY MOUTH EVERY DAY 90 tablet 0   VITAMIN D3 50 MCG (2000 UT) capsule TAKE 1 CAPSULE BY MOUTH EVERY DAY 90 capsule 1   dapagliflozin propanediol (FARXIGA) 10 MG TABS tablet Take 1 tablet (10 mg total) by mouth daily before breakfast. (Patient not taking: Reported on 12/23/2022) 90 tablet 1   No facility-administered medications prior to visit.    No Known Allergies  Review of Systems  Constitutional:  Negative for chills, fever and malaise/fatigue.  Respiratory:  Negative for cough and shortness of breath.   Cardiovascular:  Negative for chest pain, palpitations and leg swelling.  Gastrointestinal:  Negative for abdominal pain, constipation, diarrhea, nausea and vomiting.  Genitourinary:  Negative for dysuria, flank pain, frequency, hematuria and urgency.  Musculoskeletal:  Positive for back pain and joint pain.  Neurological:  Negative for dizziness, tingling, sensory change and focal weakness.       Objective:    Physical Exam Constitutional:      General: He is not in acute distress.    Appearance: He is not ill-appearing.  Eyes:     Extraocular Movements: Extraocular movements intact.     Conjunctiva/sclera: Conjunctivae normal.  Cardiovascular:     Rate and Rhythm: Normal rate and regular rhythm.  Pulmonary:     Effort: Pulmonary effort is normal.     Breath sounds: Normal breath sounds.  Abdominal:     Tenderness: There is no right CVA tenderness or  left CVA tenderness.  Musculoskeletal:     Left shoulder: Tenderness present. No deformity. Normal strength. Normal pulse.     Cervical back: Normal range of motion and neck supple. No tenderness.     Thoracic back: Normal.     Lumbar back: No spasms, tenderness or bony tenderness. Normal range of motion. Negative right straight leg raise test and negative left straight leg raise test.     Comments: Good motion and strength of back. No  weakness  Lymphadenopathy:     Cervical: No cervical adenopathy.  Skin:    General: Skin is warm and dry.     Findings: No rash.  Neurological:     General: No focal deficit present.     Mental Status: He is alert and oriented to person, place, and time.     Sensory: No sensory deficit.     Motor: No weakness.     Coordination: Coordination normal.     Gait: Gait normal.  Psychiatric:        Mood and Affect: Mood normal.        Behavior: Behavior normal.        Thought Content: Thought content normal.      BP 130/82 (BP Location: Left Arm, Patient Position: Sitting, Cuff Size: Large)   Pulse 95   Temp 97.8 F (36.6 C) (Temporal)   Ht 5\' 11"  (1.803 m)   Wt 225 lb (102.1 kg)   SpO2 98%   BMI 31.38 kg/m  Wt Readings from Last 3 Encounters:  12/23/22 225 lb (102.1 kg)  07/14/22 232 lb 9.6 oz (105.5 kg)  04/06/22 243 lb (110.2 kg)       Assessment & Plan:   Problem List Items Addressed This Visit   None Visit Diagnoses     Acute bilateral low back pain without sciatica    -  Primary   Relevant Medications   meloxicam (MOBIC) 7.5 MG tablet   Other Relevant Orders   DG Lumbar Spine Complete   Urinalysis, Routine w reflex microscopic (Completed)   Acute pain of left shoulder       Relevant Medications   meloxicam (MOBIC) 7.5 MG tablet      History of lumbar spine pain and DDD.  Lumbar spine X ray ordered.  UA negative.  No red flag symptoms.  Try meloxicam x 1-2 weeks. Discussed conservative treatment measures.  Follow up if  worsening or new symptoms.    I am having Azekiel R. Vilchis start on meloxicam. I am also having him maintain his multivitamin with minerals, Omega-3, Calcium-Vitamin D3, Vitamin D3, dapagliflozin propanediol, Vitamin D3, atorvastatin, irbesartan, and torsemide.  Meds ordered this encounter  Medications   meloxicam (MOBIC) 7.5 MG tablet    Sig: Take 1 tablet (7.5 mg total) by mouth daily.    Dispense:  30 tablet    Refill:  0    Order Specific Question:   Supervising Provider    Answer:   Hillard Danker A [[id number]]

## 2022-12-27 DIAGNOSIS — H6123 Impacted cerumen, bilateral: Secondary | ICD-10-CM | POA: Diagnosis not present

## 2023-02-02 DIAGNOSIS — L308 Other specified dermatitis: Secondary | ICD-10-CM | POA: Diagnosis not present

## 2023-02-02 DIAGNOSIS — Z85828 Personal history of other malignant neoplasm of skin: Secondary | ICD-10-CM | POA: Diagnosis not present

## 2023-02-02 DIAGNOSIS — I8311 Varicose veins of right lower extremity with inflammation: Secondary | ICD-10-CM | POA: Diagnosis not present

## 2023-02-02 DIAGNOSIS — L820 Inflamed seborrheic keratosis: Secondary | ICD-10-CM | POA: Diagnosis not present

## 2023-02-02 DIAGNOSIS — I872 Venous insufficiency (chronic) (peripheral): Secondary | ICD-10-CM | POA: Diagnosis not present

## 2023-02-02 DIAGNOSIS — L570 Actinic keratosis: Secondary | ICD-10-CM | POA: Diagnosis not present

## 2023-02-02 DIAGNOSIS — I8312 Varicose veins of left lower extremity with inflammation: Secondary | ICD-10-CM | POA: Diagnosis not present

## 2023-02-13 ENCOUNTER — Encounter: Payer: Self-pay | Admitting: Internal Medicine

## 2023-02-13 ENCOUNTER — Ambulatory Visit (INDEPENDENT_AMBULATORY_CARE_PROVIDER_SITE_OTHER): Payer: Medicare Other | Admitting: Internal Medicine

## 2023-02-13 VITALS — BP 152/86 | HR 87 | Temp 98.4°F | Resp 16 | Ht 71.0 in | Wt 221.0 lb

## 2023-02-13 DIAGNOSIS — I10 Essential (primary) hypertension: Secondary | ICD-10-CM | POA: Diagnosis not present

## 2023-02-13 DIAGNOSIS — M7021 Olecranon bursitis, right elbow: Secondary | ICD-10-CM | POA: Diagnosis not present

## 2023-02-13 DIAGNOSIS — E538 Deficiency of other specified B group vitamins: Secondary | ICD-10-CM

## 2023-02-13 DIAGNOSIS — E781 Pure hyperglyceridemia: Secondary | ICD-10-CM | POA: Diagnosis not present

## 2023-02-13 DIAGNOSIS — N183 Chronic kidney disease, stage 3 unspecified: Secondary | ICD-10-CM | POA: Diagnosis not present

## 2023-02-13 DIAGNOSIS — E1122 Type 2 diabetes mellitus with diabetic chronic kidney disease: Secondary | ICD-10-CM | POA: Diagnosis not present

## 2023-02-13 DIAGNOSIS — E118 Type 2 diabetes mellitus with unspecified complications: Secondary | ICD-10-CM | POA: Diagnosis not present

## 2023-02-13 DIAGNOSIS — Z23 Encounter for immunization: Secondary | ICD-10-CM | POA: Diagnosis not present

## 2023-02-13 DIAGNOSIS — M7022 Olecranon bursitis, left elbow: Secondary | ICD-10-CM

## 2023-02-13 DIAGNOSIS — I6523 Occlusion and stenosis of bilateral carotid arteries: Secondary | ICD-10-CM

## 2023-02-13 DIAGNOSIS — E785 Hyperlipidemia, unspecified: Secondary | ICD-10-CM

## 2023-02-13 LAB — CBC WITH DIFFERENTIAL/PLATELET
Basophils Absolute: 0.1 10*3/uL (ref 0.0–0.1)
Basophils Relative: 0.6 % (ref 0.0–3.0)
Eosinophils Absolute: 0.2 10*3/uL (ref 0.0–0.7)
Eosinophils Relative: 2.2 % (ref 0.0–5.0)
HCT: 41.1 % (ref 39.0–52.0)
Hemoglobin: 13.5 g/dL (ref 13.0–17.0)
Lymphocytes Relative: 25.1 % (ref 12.0–46.0)
Lymphs Abs: 2.2 10*3/uL (ref 0.7–4.0)
MCHC: 32.8 g/dL (ref 30.0–36.0)
MCV: 91.5 fL (ref 78.0–100.0)
Monocytes Absolute: 0.8 10*3/uL (ref 0.1–1.0)
Monocytes Relative: 9.7 % (ref 3.0–12.0)
Neutro Abs: 5.4 10*3/uL (ref 1.4–7.7)
Neutrophils Relative %: 62.4 % (ref 43.0–77.0)
Platelets: 259 10*3/uL (ref 150.0–400.0)
RBC: 4.49 Mil/uL (ref 4.22–5.81)
RDW: 14.2 % (ref 11.5–15.5)
WBC: 8.7 10*3/uL (ref 4.0–10.5)

## 2023-02-13 LAB — HEMOGLOBIN A1C: Hgb A1c MFr Bld: 6.9 % — ABNORMAL HIGH (ref 4.6–6.5)

## 2023-02-13 LAB — FOLATE: Folate: >24.2 ng/mL (ref >5.9)

## 2023-02-13 LAB — TSH: TSH: 2.05 u[IU]/mL (ref 0.35–5.50)

## 2023-02-13 LAB — VITAMIN B12: Vitamin B-12: 572 pg/mL (ref 211–911)

## 2023-02-13 NOTE — Patient Instructions (Signed)
Hypertension, Adult High blood pressure (hypertension) is when the force of blood pumping through the arteries is too strong. The arteries are the blood vessels that carry blood from the heart throughout the body. Hypertension forces the heart to work harder to pump blood and may cause arteries to become narrow or stiff. Untreated or uncontrolled hypertension can lead to a heart attack, heart failure, a stroke, kidney disease, and other problems. A blood pressure reading consists of a higher number over a lower number. Ideally, your blood pressure should be below 120/80. The first ("top") number is called the systolic pressure. It is a measure of the pressure in your arteries as your heart beats. The second ("bottom") number is called the diastolic pressure. It is a measure of the pressure in your arteries as the heart relaxes. What are the causes? The exact cause of this condition is not known. There are some conditions that result in high blood pressure. What increases the risk? Certain factors may make you more likely to develop high blood pressure. Some of these risk factors are under your control, including: Smoking. Not getting enough exercise or physical activity. Being overweight. Having too much fat, sugar, calories, or salt (sodium) in your diet. Drinking too much alcohol. Other risk factors include: Having a personal history of heart disease, diabetes, high cholesterol, or kidney disease. Stress. Having a family history of high blood pressure and high cholesterol. Having obstructive sleep apnea. Age. The risk increases with age. What are the signs or symptoms? High blood pressure may not cause symptoms. Very high blood pressure (hypertensive crisis) may cause: Headache. Fast or irregular heartbeats (palpitations). Shortness of breath. Nosebleed. Nausea and vomiting. Vision changes. Severe chest pain, dizziness, and seizures. How is this diagnosed? This condition is diagnosed by  measuring your blood pressure while you are seated, with your arm resting on a flat surface, your legs uncrossed, and your feet flat on the floor. The cuff of the blood pressure monitor will be placed directly against the skin of your upper arm at the level of your heart. Blood pressure should be measured at least twice using the same arm. Certain conditions can cause a difference in blood pressure between your right and left arms. If you have a high blood pressure reading during one visit or you have normal blood pressure with other risk factors, you may be asked to: Return on a different day to have your blood pressure checked again. Monitor your blood pressure at home for 1 week or longer. If you are diagnosed with hypertension, you may have other blood or imaging tests to help your health care provider understand your overall risk for other conditions. How is this treated? This condition is treated by making healthy lifestyle changes, such as eating healthy foods, exercising more, and reducing your alcohol intake. You may be referred for counseling on a healthy diet and physical activity. Your health care provider may prescribe medicine if lifestyle changes are not enough to get your blood pressure under control and if: Your systolic blood pressure is above 130. Your diastolic blood pressure is above 80. Your personal target blood pressure may vary depending on your medical conditions, your age, and other factors. Follow these instructions at home: Eating and drinking  Eat a diet that is high in fiber and potassium, and low in sodium, added sugar, and fat. An example of this eating plan is called the DASH diet. DASH stands for Dietary Approaches to Stop Hypertension. To eat this way: Eat   plenty of fresh fruits and vegetables. Try to fill one half of your plate at each meal with fruits and vegetables. Eat whole grains, such as whole-wheat pasta, brown rice, or whole-grain bread. Fill about one  fourth of your plate with whole grains. Eat or drink low-fat dairy products, such as skim milk or low-fat yogurt. Avoid fatty cuts of meat, processed or cured meats, and poultry with skin. Fill about one fourth of your plate with lean proteins, such as fish, chicken without skin, beans, eggs, or tofu. Avoid pre-made and processed foods. These tend to be higher in sodium, added sugar, and fat. Reduce your daily sodium intake. Many people with hypertension should eat less than 1,500 mg of sodium a day. Do not drink alcohol if: Your health care provider tells you not to drink. You are pregnant, may be pregnant, or are planning to become pregnant. If you drink alcohol: Limit how much you have to: 0-1 drink a day for women. 0-2 drinks a day for men. Know how much alcohol is in your drink. In the [place] one drink equals one 12 oz bottle of beer (355 mL), one 5 oz glass of wine (148 mL), or one 1 oz glass of hard liquor (44 mL). Lifestyle  Work with your health care provider to maintain a healthy body weight or to lose weight. Ask what an ideal weight is for you. Get at least 30 minutes of exercise that causes your heart to beat faster (aerobic exercise) most days of the week. Activities may include walking, swimming, or biking. Include exercise to strengthen your muscles (resistance exercise), such as Pilates or lifting weights, as part of your weekly exercise routine. Try to do these types of exercises for 30 minutes at least 3 days a week. Do not use any products that contain nicotine or tobacco. These products include cigarettes, chewing tobacco, and vaping devices, such as e-cigarettes. If you need help quitting, ask your health care provider. Monitor your blood pressure at home as told by your health care provider. Keep all follow-up visits. This is important. Medicines Take over-the-counter and prescription medicines only as told by your health care provider. Follow directions carefully. Blood  pressure medicines must be taken as prescribed. Do not skip doses of blood pressure medicine. Doing this puts you at risk for problems and can make the medicine less effective. Ask your health care provider about side effects or reactions to medicines that you should watch for. Contact a health care provider if you: Think you are having a reaction to a medicine you are taking. Have headaches that keep coming back (recurring). Feel dizzy. Have swelling in your ankles. Have trouble with your vision. Get help right away if you: Develop a severe headache or confusion. Have unusual weakness or numbness. Feel faint. Have severe pain in your chest or abdomen. Vomit repeatedly. Have trouble breathing. These symptoms may be an emergency. Get help right away. Call 911. Do not wait to see if the symptoms will go away. Do not drive yourself to the hospital. Summary Hypertension is when the force of blood pumping through your arteries is too strong. If this condition is not controlled, it may put you at risk for serious complications. Your personal target blood pressure may vary depending on your medical conditions, your age, and other factors. For most people, a normal blood pressure is less than 120/80. Hypertension is treated with lifestyle changes, medicines, or a combination of both. Lifestyle changes include losing weight, eating a healthy,   low-sodium diet, exercising more, and limiting alcohol. This information is not intended to replace advice given to you by your health care provider. Make sure you discuss any questions you have with your health care provider. Document Revised: 03/09/2021 Document Reviewed: 03/09/2021 Elsevier Patient Education  2024 Elsevier Inc.  

## 2023-02-13 NOTE — Progress Notes (Unsigned)
Subjective:  Patient ID: Louis Kent, male    DOB: 08/30/1940  Age: 82 y.o. MRN: [id number]  CC: Hypertension, Hyperlipidemia, and Diabetes   HPI Louis Kent presents for f/up ---   Discussed the use of AI scribe software for clinical note transcription with the patient, who gave verbal consent to proceed.  History of Present Illness   The patient, with a history of diabetes and HTN presents with concerns about recent back pain and numbness in the fingers. They report that the back pain was severe enough to limit mobility and cause breathlessness during deep inhalation. An X-ray was performed and revealed a level one issue in the vertebrae and some arterial calcification. The back pain was managed with Meloxicam, which resolved the issue within a week and a half.  The patient also reports numbness in the fingers, which has been disruptive to their sleep. They have been self-managing this symptom with Meloxicam for a few days at a time, which seems to alleviate the issue. They also report occasional joint pain and swelling, particularly in the elbow region, which they suspect may be due to arthritis or bursitis.  The patient has been experiencing some urinary symptoms, which they attribute to their medication, Torsemide. They deny any symptoms of excessive thirst or urination. They have lost approximately 14-15 pounds through dietary changes and regular exercise, which includes 4,000-8,000 steps per day.  The patient has been prescribed Farxiga for DM2 but has not been taking it due to insurance coverage issues and potential side effects. They express a desire to discuss this medication further. They also express concern about potential B12 deficiency, as they have been experiencing symptoms that they believe may be related to this condition.       Outpatient Medications Prior to Visit  Medication Sig Dispense Refill   atorvastatin (LIPITOR) 10 MG tablet TAKE 1 TABLET BY MOUTH EVERY  DAY 90 tablet 1   Calcium Carb-Cholecalciferol (CALCIUM-VITAMIN D3) 600-400 MG-UNIT CAPS Take 1 capsule by mouth daily.     Cholecalciferol (VITAMIN D3) 50 MCG (2000 UT) capsule Take 2,000 Units by mouth daily.     dapagliflozin propanediol (FARXIGA) 10 MG TABS tablet Take 1 tablet (10 mg total) by mouth daily before breakfast. (Patient not taking: Reported on 12/23/2022) 90 tablet 1   irbesartan (AVAPRO) 300 MG tablet TAKE 1 TABLET BY MOUTH EVERY DAY 90 tablet 0   meloxicam (MOBIC) 7.5 MG tablet Take 1 tablet (7.5 mg total) by mouth daily. 30 tablet 0   Multiple Vitamins-Minerals (MULTIVITAMIN WITH MINERALS) tablet Take by mouth.     Omega-3 1400 MG CAPS Take by mouth.     torsemide (DEMADEX) 20 MG tablet TAKE 1 TABLET BY MOUTH EVERY DAY 90 tablet 0   VITAMIN D3 50 MCG (2000 UT) capsule TAKE 1 CAPSULE BY MOUTH EVERY DAY 90 capsule 1   No facility-administered medications prior to visit.    ROS Review of Systems  Objective:  BP (!) 152/86 (BP Location: Right Arm, Patient Position: Sitting, Cuff Size: Large)   Pulse 87   Temp 98.4 F (36.9 C) (Oral)   Resp 16   Ht 5\' 11"  (1.803 m)   Wt 221 lb (100.2 kg)   SpO2 98%   PF 94 L/min   BMI 30.82 kg/m   BP Readings from Last 3 Encounters:  02/13/23 (!) 152/86  12/23/22 130/82  07/14/22 (!) 140/66    Wt Readings from Last 3 Encounters:  02/13/23 221 lb (100.2 kg)  12/23/22 225 lb (102.1 kg)  07/14/22 232 lb 9.6 oz (105.5 kg)    Physical Exam Cardiovascular:     Rate and Rhythm: Normal rate and regular rhythm.     Heart sounds: Normal heart sounds, S1 normal and S2 normal. No murmur heard.    No gallop.     Comments: EKG- NSR, 83 bpm ?LAE No LVH, Q waves, or ST/T waves  Musculoskeletal:     Right lower leg: No edema.     Left lower leg: No edema.     Lab Results  Component Value Date   WBC 8.1 04/06/2022   HGB 14.3 04/06/2022   HCT 43.3 04/06/2022   PLT 207.0 04/06/2022   GLUCOSE 129 (H) 01/07/2022   CHOL 145  03/10/2021   TRIG 203.0 (H) 03/10/2021   HDL 55.10 03/10/2021   LDLDIRECT 47.0 03/10/2021   LDLCALC 62 09/10/2019   ALT 27 04/06/2022   AST 28 04/06/2022   NA 138 01/07/2022   K 3.9 01/07/2022   CL 99 01/07/2022   CREATININE 1.10 01/07/2022   BUN 20 01/07/2022   CO2 29 01/07/2022   TSH 2.36 04/06/2022   PSA 0.00 (L) 09/15/2021   HGBA1C 6.8 (H) 04/06/2022   MICROALBUR 9.5 (H) 09/15/2021    No results found.  Assessment & Plan:  B12 nutritional deficiency -     CBC with Differential/Platelet; Future -     Folate; Future -     Vitamin B12; Future  Pure hyperglyceridemia -     Lipid panel; Future  Essential hypertension -     TSH; Future -     Basic metabolic panel; Future -     EKG 12-Lead  Hyperlipidemia with target LDL less than 130 -     Lipid panel; Future -     TSH; Future -     Hepatic function panel; Future  Type II diabetes mellitus with manifestations (HCC) -     Hepatic function panel; Future -     Hemoglobin A1c; Future -     Microalbumin / creatinine urine ratio; Future -     Basic metabolic panel; Future  Flu vaccine need -     Flu Vaccine Trivalent High Dose (Fluad)  Olecranon bursitis of both elbows -     Ambulatory referral to Sports Medicine     Follow-up: Return in about 3 months (around 05/15/2023).  Sanda Linger, MD

## 2023-02-14 ENCOUNTER — Encounter: Payer: Self-pay | Admitting: Internal Medicine

## 2023-02-14 ENCOUNTER — Other Ambulatory Visit: Payer: Self-pay | Admitting: Internal Medicine

## 2023-02-14 DIAGNOSIS — M1A09X0 Idiopathic chronic gout, multiple sites, without tophus (tophi): Secondary | ICD-10-CM

## 2023-02-14 DIAGNOSIS — E118 Type 2 diabetes mellitus with unspecified complications: Secondary | ICD-10-CM

## 2023-02-14 DIAGNOSIS — E1122 Type 2 diabetes mellitus with diabetic chronic kidney disease: Secondary | ICD-10-CM

## 2023-02-14 LAB — LIPID PANEL
Cholesterol: 150 mg/dL (ref 0–200)
HDL: 68.2 mg/dL (ref >39.00)
LDL Cholesterol: 41 mg/dL (ref 0–99)
NonHDL: 82.25
Total CHOL/HDL Ratio: 2
Triglycerides: 207 mg/dL — ABNORMAL HIGH (ref 0.0–149.0)
VLDL: 41.4 mg/dL — ABNORMAL HIGH (ref 0.0–40.0)

## 2023-02-14 LAB — BASIC METABOLIC PANEL
BUN: 34 mg/dL — ABNORMAL HIGH (ref 6–23)
CO2: 29 meq/L (ref 19–32)
Calcium: 10 mg/dL (ref 8.4–10.5)
Chloride: 97 meq/L (ref 96–112)
Creatinine, Ser: 1.4 mg/dL (ref 0.40–1.50)
GFR: 46.18 mL/min — ABNORMAL LOW (ref >60.00)
Glucose, Bld: 115 mg/dL — ABNORMAL HIGH (ref 70–99)
Potassium: 4.1 meq/L (ref 3.5–5.1)
Sodium: 138 meq/L (ref 135–145)

## 2023-02-14 LAB — HEPATIC FUNCTION PANEL
ALT: 18 U/L (ref 0–53)
AST: 23 U/L (ref 0–37)
Albumin: 4.3 g/dL (ref 3.5–5.2)
Alkaline Phosphatase: 104 U/L (ref 39–117)
Bilirubin, Direct: 0.1 mg/dL (ref 0.0–0.3)
Total Bilirubin: 0.7 mg/dL (ref 0.2–1.2)
Total Protein: 7.7 g/dL (ref 6.0–8.3)

## 2023-02-14 LAB — MICROALBUMIN / CREATININE URINE RATIO
Creatinine,U: 67.2 mg/dL
Microalb Creat Ratio: 1.7 mg/g (ref 0.0–30.0)
Microalb, Ur: 1.2 mg/dL (ref 0.0–1.9)

## 2023-02-14 MED ORDER — DAPAGLIFLOZIN PROPANEDIOL 10 MG PO TABS: 10.0000 mg | ORAL_TABLET | Freq: Every day | ORAL | 0 refills | Status: DC

## 2023-02-14 MED ORDER — DAPAGLIFLOZIN PROPANEDIOL 10 MG PO TABS
10.0000 mg | ORAL_TABLET | Freq: Every day | ORAL | 0 refills | Status: DC
Start: 2023-02-14 — End: 2023-05-18

## 2023-02-14 MED ORDER — IRBESARTAN 300 MG PO TABS
300.0000 mg | ORAL_TABLET | Freq: Every day | ORAL | 0 refills | Status: DC
Start: 2023-02-14 — End: 2023-05-11

## 2023-02-14 MED ORDER — ATORVASTATIN CALCIUM 10 MG PO TABS: 10.0000 mg | ORAL_TABLET | Freq: Every day | ORAL | 0 refills | Status: DC

## 2023-02-15 ENCOUNTER — Ambulatory Visit (INDEPENDENT_AMBULATORY_CARE_PROVIDER_SITE_OTHER): Payer: Medicare Other | Admitting: Sports Medicine

## 2023-02-15 ENCOUNTER — Ambulatory Visit (INDEPENDENT_AMBULATORY_CARE_PROVIDER_SITE_OTHER): Payer: Medicare Other

## 2023-02-15 ENCOUNTER — Other Ambulatory Visit: Payer: Self-pay | Admitting: Internal Medicine

## 2023-02-15 ENCOUNTER — Telehealth: Payer: Self-pay

## 2023-02-15 VITALS — BP 142/90 | HR 86 | Ht 71.0 in | Wt 224.0 lb

## 2023-02-15 DIAGNOSIS — M25522 Pain in left elbow: Secondary | ICD-10-CM | POA: Diagnosis not present

## 2023-02-15 DIAGNOSIS — R200 Anesthesia of skin: Secondary | ICD-10-CM | POA: Diagnosis not present

## 2023-02-15 DIAGNOSIS — M79641 Pain in right hand: Secondary | ICD-10-CM

## 2023-02-15 DIAGNOSIS — M79642 Pain in left hand: Secondary | ICD-10-CM

## 2023-02-15 DIAGNOSIS — Z23 Encounter for immunization: Secondary | ICD-10-CM | POA: Diagnosis not present

## 2023-02-15 DIAGNOSIS — E559 Vitamin D deficiency, unspecified: Secondary | ICD-10-CM

## 2023-02-15 DIAGNOSIS — M25521 Pain in right elbow: Secondary | ICD-10-CM

## 2023-02-15 DIAGNOSIS — M7989 Other specified soft tissue disorders: Secondary | ICD-10-CM | POA: Diagnosis not present

## 2023-02-15 DIAGNOSIS — M1812 Unilateral primary osteoarthritis of first carpometacarpal joint, left hand: Secondary | ICD-10-CM | POA: Diagnosis not present

## 2023-02-15 DIAGNOSIS — M7021 Olecranon bursitis, right elbow: Secondary | ICD-10-CM | POA: Diagnosis not present

## 2023-02-15 LAB — URIC ACID: Uric Acid, Serum: 11.8 mg/dL — ABNORMAL HIGH (ref 4.0–7.8)

## 2023-02-15 MED ORDER — COLCHICINE 0.6 MG PO TABS: 0.6000 mg | ORAL_TABLET | Freq: Every day | ORAL | 0 refills | Status: DC

## 2023-02-15 MED ORDER — ALLOPURINOL 100 MG PO TABS
50.0000 mg | ORAL_TABLET | Freq: Every day | ORAL | 0 refills | Status: DC
Start: 2023-02-15 — End: 2023-04-17

## 2023-02-15 NOTE — Progress Notes (Signed)
[facility] [address] [place] [address] Phone: [phone]   Assessment and Plan:     1. Bilateral elbow joint pain 2. Bilateral hand pain -Chronic with exacerbation, initial sports medicine visit - Bilateral hand and elbow pain, worse on right, with right elbow redness and olecranon bursitis. - Findings would be consistent with gout based on red swollen joints, polyarthralgia, patient age and comorbidities.  No uric acid level on file, so we will obtain uric acid level today. - Recommend discontinuing NSAIDs due to recent lab work showing CKD with GFR <50 - May start colchicine 0.6 mg.  Recommend taking 2 tablets of colchicine tonight and then continuing colchicine 0.6 mg daily until redness, swelling, joint pain improves - Start Tylenol 500 to 1000 mg tablets 2-3 times a day for day-to-day pain relief     Pertinent previous records reviewed include lab work from 02/13/2023   Follow Up: I will call patient with results of uric acid.  If elevated, could start allopurinol.  If uric acid level WNL, would discontinue colchicine and suspect arthritic changes as patient's primary pathology   Subjective:   I, Louis Kent, am serving as a Neurosurgeon for Doctor Richardean Sale  Chief Complaint: bilat elbow pain   HPI:   02/15/23 Patient is a 82 year old male complaining of bilat elbow pain. Patient states that he had bilat hand pain and the pain has radiated to his elbows . Decreased grip strength and numbness and tingling. Pain for the last 2-3 month that have increased over the last week. Meloxicam did help with his hand and elbow pain but it didn't take the pain away. Pain is worse at night and once he get them moving the pain eases.   Relevant Historical Information: Hypertension, CKD, DM type II  Additional pertinent review of systems negative.   Current Outpatient Medications:    atorvastatin (LIPITOR) 10 MG tablet,  Take 1 tablet (10 mg total) by mouth daily., Disp: 90 tablet, Rfl: 0   Calcium Carb-Cholecalciferol (CALCIUM-VITAMIN D3) 600-400 MG-UNIT CAPS, Take 1 capsule by mouth daily., Disp: , Rfl:    colchicine 0.6 MG tablet, Take 1 tablet (0.6 mg total) by mouth daily., Disp: 30 tablet, Rfl: 0   dapagliflozin propanediol (FARXIGA) 10 MG TABS tablet, Take 1 tablet (10 mg total) by mouth daily before breakfast., Disp: 90 tablet, Rfl: 0   irbesartan (AVAPRO) 300 MG tablet, Take 1 tablet (300 mg total) by mouth daily., Disp: 90 tablet, Rfl: 0   Multiple Vitamins-Minerals (MULTIVITAMIN WITH MINERALS) tablet, Take by mouth., Disp: , Rfl:    Omega-3 1400 MG CAPS, Take by mouth., Disp: , Rfl:    torsemide (DEMADEX) 20 MG tablet, TAKE 1 TABLET BY MOUTH EVERY DAY, Disp: 90 tablet, Rfl: 0   VITAMIN D3 50 MCG (2000 UT) capsule, TAKE 1 CAPSULE BY MOUTH EVERY DAY, Disp: 90 capsule, Rfl: 1   Objective:     Vitals:   02/15/23 1106  BP: (!) 142/90  Pulse: 86  SpO2: 98%  Weight: 224 lb (101.6 kg)  Height: 5\' 11"  (1.803 m)      Body mass index is 31.24 kg/m.    Physical Exam:    General: Appears well, no acute distress, nontoxic and pleasant Neck: FROM, no pain Neuro: sensation is intact distally with no deficits, strenghth is 5/5 in elbow flexors/extenders/supinator/pronators and wrist flexors/extensors Psych: no evidence of anxiety or depression  Bilateral elbow: No deformity, swelling or  muscle wasting Mild swelling over left olecranon with redness Normal Carrying angle ROM:0-140, supination and pronation 90 TTP olecranon NTTP over triceps, ticeps tendon,  at epicondyle, medial epicondyle, antecubital fossa, biceps tendon, supinator, pronator Negative tinnels over cubital tunnel  General: Appears well, nad, nontoxic and pleasant Neuro:sensation intact, strength is 5/5 in upper extremities, muscle tone wnl Skin:no susupicious lesions or rashes  Bilateral hand/wrist:   No deformity or swelling  appreciated. ROM  Ext 90, flexion70, radial/ulnar deviation 30 nttp over the snuff box, dorsal carpals, volar carpals, radial styloid, ulnar styloid, 1st mcp, tfcc Negative Tinel's Negative finklestein Neg tfcc bounce test No pain with resisted ext, flex or deviation   Electronically signed by:  [facility] 11:38 AM 02/15/23

## 2023-02-15 NOTE — Patient Instructions (Addendum)
Labs on the way out Colchicine  0.6 mg 2 pills tonight and then one a day until pain resolves Tylenol [phone] mg 2-3 times a day for pain relief   We will call with lab results and future plans

## 2023-02-15 NOTE — Progress Notes (Signed)
Care Guide Note  02/15/2023 Name: Louis Kent MRN: [id number] DOB: 1940-11-13  Referred by: Etta Grandchild, MD Reason for referral : Care Coordination (Outreach to schedule pharm d )   Louis Kent is a 81 y.o. year old male who is a primary care patient of Etta Grandchild, MD. Louis Kent was referred to the pharmacist for assistance related to DM.    Successful contact was made with the patient to discuss pharmacy services including being ready for the pharmacist to call at least 5 minutes before the scheduled appointment time, to have medication bottles and any blood sugar or blood pressure readings ready for review. The patient agreed to meet with the pharmacist via with the pharmacist via telephone visit on (date/time).  02/16/2023  Penne Lash, RMA Care Guide [facility]  [place], [place] [address] Direct Dial: [phone] [email or web address]

## 2023-02-16 ENCOUNTER — Other Ambulatory Visit: Payer: Medicare Other

## 2023-02-16 DIAGNOSIS — R600 Localized edema: Secondary | ICD-10-CM

## 2023-02-16 DIAGNOSIS — I10 Essential (primary) hypertension: Secondary | ICD-10-CM

## 2023-02-16 MED ORDER — TORSEMIDE 10 MG PO TABS
10.0000 mg | ORAL_TABLET | Freq: Every day | ORAL | 0 refills | Status: DC
Start: 2023-02-16 — End: 2023-05-11

## 2023-02-16 NOTE — Patient Instructions (Addendum)
It was a pleasure speaking with you today!  Decrease torsemide to 10 mg daily when you start Farxiga. New prescription for reduced dose of torsemide [facility].  Feel free to call me if there are any questions or concerns.  Arbutus Leas, PharmD, BCPS [facility] [phone]

## 2023-02-16 NOTE — Progress Notes (Signed)
02/16/2023 Name: Louis Kent MRN: [id number] DOB: [birth year]  Chief Complaint  Patient presents with   Medication Management    JEPSON GAZZOLA is a 82 y.o. year old male who presented for a telephone visit.   They were referred to the pharmacist by their PCP for assistance in managing medication access.    Subjective:  Care Team: Primary Care Provider: Etta Grandchild, MD ; Next Scheduled Visit: 05/23/2023   Medication Access/Adherence  Current Pharmacy:  [id number] [place], [place] - [address] AT [facility] [address] [place] [address] Phone: [phone] Fax: [phone]   Patient reports affordability concerns with their medications: Yes  Patient reports access/transportation concerns to their pharmacy: No  Patient reports adherence concerns with their medications:  No     Diabetes/CKD:  Current medications: none (prescribed Marcelline Deist but has not yet started) Pt had questions regarding side effects and how Marcelline Deist may interact with his other medications, specifically torsemide   Marcelline Deist was $800 for 90 DS when the pharmacy tried to fill it per patient. Looked up cost through [employer].gov with his insurance and cost should be $140 for 30 DS or $412 for 90 DS.  Uric acid: Pt was just tested for gout/elevated uric acid at sports medicine consult.  He was started on colchicine and allopurinol for elevated uric acid.   Objective:  Lab Results  Component Value Date   HGBA1C 6.9 (H) 02/13/2023    Lab Results  Component Value Date   CREATININE 1.40 02/13/2023   BUN 34 (H) 02/13/2023   NA 138 02/13/2023   K 4.1 02/13/2023   CL 97 02/13/2023   CO2 29 02/13/2023    Lab Results  Component Value Date   CHOL 150 02/13/2023   HDL 68.20 02/13/2023   LDLCALC 41 02/13/2023   LDLDIRECT 47.0 03/10/2021   TRIG 207.0 (H) 02/13/2023   CHOLHDL 2 02/13/2023    Medications Reviewed Today     Reviewed by  Bonita Quin, RPH (Pharmacist) on 02/16/23 at 1542  Med List Status: <None>   Medication Order Taking? Sig Documenting Provider Last Dose Status Informant  allopurinol (ZYLOPRIM) 100 MG tablet [id number] Yes Take 0.5 tablets (50 mg total) by mouth daily. Richardean Sale, DO Taking Active   atorvastatin (LIPITOR) 10 MG tablet [id number] Yes Take 1 tablet (10 mg total) by mouth daily. Etta Grandchild, MD Taking Active   Calcium Carb-Cholecalciferol (CALCIUM-VITAMIN D3) 600-400 MG-UNIT CAPS [id number] Yes Take 1 capsule by mouth daily. [provider] Taking Active Self  colchicine 0.6 MG tablet [id number] Yes Take 1 tablet (0.6 mg total) by mouth daily. Richardean Sale, DO Taking Active   dapagliflozin propanediol (FARXIGA) 10 MG TABS tablet [id number] No Take 1 tablet (10 mg total) by mouth daily before breakfast.  Patient not taking: Reported on 02/16/2023   Etta Grandchild, MD Not Taking Active   irbesartan (AVAPRO) 300 MG tablet [id number] Yes Take 1 tablet (300 mg total) by mouth daily. Etta Grandchild, MD Taking Active   Multiple Vitamins-Minerals (MULTIVITAMIN WITH MINERALS) tablet [id number] Yes Take by mouth. [provider] Taking Active   Omega-3 1400 MG CAPS [id number] Yes Take by mouth. [provider] Taking Active   torsemide (DEMADEX) 20 MG tablet [id number] Yes TAKE 1 TABLET BY MOUTH EVERY DAY Etta Grandchild, MD Taking Active   VITAMIN D3 50 MCG (2000 UT) capsule [id number] Yes TAKE 1 CAPSULE BY MOUTH EVERY DAY Yetta Barre,  Bernadene Bell, MD Taking Active               Assessment/Plan:   Diabetes/CKD: - Currently controlled, goal A1c <7.0% - Reviewed indication/benefit of Farxiga and also reviewed side effects/possible interactions with other medications. - Pt would like to start it and states he is able to afford the copay - Due to risk of volume depletion when starting [place] with daily torsemide use in addition to patient's reduced kidney function,  recommend reducing torsemide to 10 mg daily when he starts [place]. Patient to monitor for worsened leg edema. - He has PCP appt scheduled for 05/23/2023 - recommend BMP at that time after ~3 months on Farxiga  Uric acid: - Continue colchicine plus allopurinol. May be able to stop colchicine after 3-6 months and remain on allopurinol. - Recommend checking uric acid at PCP appt in 3 months if sports medicine has not done so - Pt confirmed he has handout on dietary considerations for elevated uric acid   Follow Up Plan: 11/14 at 2 PM telephone appt  Arbutus Leas, PharmD, BCPS [facility] [phone]

## 2023-02-17 ENCOUNTER — Other Ambulatory Visit: Payer: Self-pay | Admitting: Internal Medicine

## 2023-02-17 DIAGNOSIS — I10 Essential (primary) hypertension: Secondary | ICD-10-CM

## 2023-02-17 DIAGNOSIS — R600 Localized edema: Secondary | ICD-10-CM

## 2023-03-09 ENCOUNTER — Other Ambulatory Visit: Payer: Self-pay | Admitting: Sports Medicine

## 2023-03-13 ENCOUNTER — Other Ambulatory Visit: Payer: Self-pay | Admitting: Sports Medicine

## 2023-03-29 DIAGNOSIS — H6123 Impacted cerumen, bilateral: Secondary | ICD-10-CM | POA: Diagnosis not present

## 2023-03-30 ENCOUNTER — Other Ambulatory Visit: Payer: Medicare Other | Admitting: Pharmacist

## 2023-03-30 DIAGNOSIS — M1A09X0 Idiopathic chronic gout, multiple sites, without tophus (tophi): Secondary | ICD-10-CM

## 2023-03-30 DIAGNOSIS — E118 Type 2 diabetes mellitus with unspecified complications: Secondary | ICD-10-CM

## 2023-03-30 NOTE — Progress Notes (Signed)
   03/30/2023 Name: Louis Kent MRN: [id number] DOB: 1940-11-24  No chief complaint on file.   Louis Kent is a 82 y.o. year old male who presented for a telephone visit.   They were referred to the pharmacist by their PCP for assistance in managing medication access.     Subjective:  Care Team: Primary Care Provider: Etta Grandchild, MD ; Next Scheduled Visit: 05/23/2023   Medication Access/Adherence  Current Pharmacy:  [id number] - [place], [place] - [address] AT [facility] [address] [place] [address] Phone: [phone] Fax: [phone]   Patient reports affordability concerns with their medications: Yes  Patient reports access/transportation concerns to their pharmacy: No  Patient reports adherence concerns with their medications:  No     Diabetes/CKD:  Current medications: Farxiga 10 mg daily  Marcelline Deist was $800 for 90 DS when the pharmacy tried to fill it per patient. Looked up cost through [employer] with his insurance and cost should be $140 for 30 DS or $412 for 90 DS.  Uric acid: Pt was just tested for gout/elevated uric acid at [facility].  He was started on colchicine and allopurinol for elevated uric acid. Current medications: allopurinol 100 mg 1/2 tablet daily. Pt states he stopped colchicine after about 3 weeks.   Objective:  Lab Results  Component Value Date   HGBA1C 6.9 (H) 02/13/2023    Lab Results  Component Value Date   CREATININE 1.40 02/13/2023   BUN 34 (H) 02/13/2023   NA 138 02/13/2023   K 4.1 02/13/2023   CL 97 02/13/2023   CO2 29 02/13/2023    Lab Results  Component Value Date   CHOL 150 02/13/2023   HDL 68.20 02/13/2023   LDLCALC 41 02/13/2023   LDLDIRECT 47.0 03/10/2021   TRIG 207.0 (H) 02/13/2023   CHOLHDL 2 02/13/2023    Medications Reviewed Today   Medications were not reviewed in this encounter       Assessment/Plan:    Diabetes/CKD: - Currently controlled, goal A1c <7.0% - Pt states he is able to afford the copay - Due to risk of volume depletion when starting [place] with daily torsemide use in addition to patient's reduced kidney function, recommend reducing torsemide to 10 mg daily when he starts [place]. Patient to monitor for worsened leg edema. - He has PCP appt scheduled for 05/23/2023 - recommend BMP at that time after ~3 months on Farxiga  Uric acid: - Continue allopurinol. Pt was meant to stop colchicine after 3-6 months and remain on allopurinol however he already stopped colchicine - Pt would like to get uric acid level checked as he is still having joint pain. Recommended to check uric acid 2-4 weeks after allopurinol initiation - Pt confirmed he has handout on dietary considerations for elevated uric acid  Follow Up Plan: lab results  Arbutus Leas, PharmD, BCPS [facility] [phone]

## 2023-03-31 ENCOUNTER — Other Ambulatory Visit (INDEPENDENT_AMBULATORY_CARE_PROVIDER_SITE_OTHER): Payer: Medicare Other

## 2023-03-31 DIAGNOSIS — M1A09X0 Idiopathic chronic gout, multiple sites, without tophus (tophi): Secondary | ICD-10-CM

## 2023-03-31 LAB — URIC ACID: Uric Acid, Serum: 7.8 mg/dL (ref 4.0–7.8)

## 2023-04-06 NOTE — Progress Notes (Signed)
Aleen Sells D.[facility] [address] [place] [address] Phone: [phone]   Assessment and Plan:     1. Bilateral hand pain 2. Chronic gout of multiple sites, unspecified cause 3. Bilateral hand numbness  -Chronic with exacerbation, subsequent sports medicine visit - Patient continues to have bilateral hand pain as well as bilateral hand numbness and tingling.  We discussed that there are multiple potential causes for his numbness and tingling which include suboptimally controlled gout, diabetic neuropathy, nerve impingement. - Uric acid has decreased from 11.8 on 02/15/2023 to uric acid 7.8 03/31/2023, however goal of treatment to prevent gout flare should be uric acid <6.0.  Patient has been taking allopurinol 50 mg daily.  Recommend increasing allopurinol 100 mg daily.  GFR 46.18 on 02/13/2023.  Would recommend recheck uric acid level and kidney function in 2 to 4 weeks. -Recommend using topical Voltaren gel over areas of pain - Discussed with patient that his A1c has been in a diabetic range for the past 1 year on 3 separate checks.  Patient was under the impression that he was still in a prediabetic range.  We discussed that based on A1c patient has diabetes mellitus type 2 and can further discuss with his primary care physician.  Thankfully, patient has been well-controlled without medication with most recent A1c 6.9.  Low-dose metformin versus no medical treatment would be reasonable for patient. PCP to dictate care. - Start Tylenol 500 to 1000 mg tablets 2-3 times a day for day-to-day pain relief   Pertinent previous records reviewed include left hand x-ray 02/15/2023, right hand x-ray 02/15/2023, uric acid 03/31/2023, uric acid 02/15/2023, CBC/CMP/A1c 02/13/2023  Follow Up: 2 months after evaluated by PCP to review benefit.  Could consider CSI versus nerve conduction study versus further adjustments to allopurinol if needed   Subjective:   I,  Moenique Parris, am serving as a Neurosurgeon for Doctor Richardean Sale   Chief Complaint: bilat elbow pain    HPI:    02/15/23 Patient is a 82 year old male complaining of bilat elbow pain. Patient states that he had bilat hand pain and the pain has radiated to his elbows . Decreased grip strength and numbness and tingling. Pain for the last 2-3 month that have increased over the last week. Meloxicam did help with his hand and elbow pain but it didn't take the pain away. Pain is worse at night and once he get them moving the pain eases.   04/07/2023 Patient states that he still has pain and numbness in his hands    Relevant Historical Information: Hypertension, CKD, DM type II  Additional pertinent review of systems negative.   Current Outpatient Medications:    allopurinol (ZYLOPRIM) 100 MG tablet, Take 0.5 tablets (50 mg total) by mouth daily., Disp: 60 tablet, Rfl: 0   atorvastatin (LIPITOR) 10 MG tablet, Take 1 tablet (10 mg total) by mouth daily., Disp: 90 tablet, Rfl: 0   Calcium Carb-Cholecalciferol (CALCIUM-VITAMIN D3) 600-400 MG-UNIT CAPS, Take 1 capsule by mouth daily., Disp: , Rfl:    Cholecalciferol (VITAMIN D3) 50 MCG (2000 UT) capsule, TAKE 1 CAPSULE BY MOUTH EVERY DAY, Disp: 90 capsule, Rfl: 1   colchicine 0.6 MG tablet, TAKE 1 TABLET BY MOUTH EVERY DAY, Disp: 30 tablet, Rfl: 0   dapagliflozin propanediol (FARXIGA) 10 MG TABS tablet, Take 1 tablet (10 mg total) by mouth daily before breakfast., Disp: 90 tablet, Rfl: 0   irbesartan (AVAPRO) 300 MG tablet, Take  1 tablet (300 mg total) by mouth daily., Disp: 90 tablet, Rfl: 0   Multiple Vitamins-Minerals (MULTIVITAMIN WITH MINERALS) tablet, Take by mouth., Disp: , Rfl:    Omega-3 1400 MG CAPS, Take by mouth., Disp: , Rfl:    torsemide (DEMADEX) 10 MG tablet, Take 1 tablet (10 mg total) by mouth daily., Disp: 90 tablet, Rfl: 0   Objective:     Vitals:   04/07/23 1038  BP: 130/70  Pulse: 95  SpO2: 97%  Weight: 224 lb (101.6  kg)  Height: 5\' 11"  (1.803 m)      Body mass index is 31.24 kg/m.    Physical Exam:    General: Appears well, no acute distress, nontoxic and pleasant Neck: FROM, no pain Neuro: sensation is intact distally with no deficits, strenghth is 5/5 in elbow flexors/extenders/supinator/pronators and wrist flexors/extensors Psych: no evidence of anxiety or depression   Bilateral elbow: No deformity, swelling or muscle wasting Mild swelling over left olecranon with redness Normal Carrying angle ROM:0-140, supination and pronation 90 TTP olecranon NTTP over triceps, ticeps tendon,  at epicondyle, medial epicondyle, antecubital fossa, biceps tendon, supinator, pronator Negative tinnels over cubital tunnel   General: Appears well, nad, nontoxic and pleasant Neuro:sensation intact, strength is 5/5 in upper extremities, muscle tone wnl Skin:no susupicious lesions or rashes   Bilateral hand/wrist:   No deformity or swelling appreciated. ROM  Ext 90, flexion70, radial/ulnar deviation 30 nttp over the snuff box, dorsal carpals, volar carpals, radial styloid, ulnar styloid, 1st mcp, tfcc Negative Tinel's Negative finklestein Neg tfcc bounce test No pain with resisted ext, flex or deviation    Electronically signed by:  Aleen Sells D.[facility] 12:27 PM 04/07/23

## 2023-04-07 ENCOUNTER — Ambulatory Visit (INDEPENDENT_AMBULATORY_CARE_PROVIDER_SITE_OTHER): Payer: Medicare Other | Admitting: Sports Medicine

## 2023-04-07 VITALS — BP 130/70 | HR 95 | Ht 71.0 in | Wt 224.0 lb

## 2023-04-07 DIAGNOSIS — R200 Anesthesia of skin: Secondary | ICD-10-CM

## 2023-04-07 DIAGNOSIS — M79641 Pain in right hand: Secondary | ICD-10-CM

## 2023-04-07 DIAGNOSIS — M1A09X0 Idiopathic chronic gout, multiple sites, without tophus (tophi): Secondary | ICD-10-CM

## 2023-04-07 DIAGNOSIS — M79642 Pain in left hand: Secondary | ICD-10-CM | POA: Diagnosis not present

## 2023-04-07 NOTE — Patient Instructions (Signed)
Increase allopurinol 100 mg daily and call if you need a refill  Recommend getting topical Voltaren gel over the counter Recommend talking to primary care about diabetes 2 month follow up after visit with primary care

## 2023-04-16 ENCOUNTER — Other Ambulatory Visit: Payer: Self-pay | Admitting: Sports Medicine

## 2023-04-16 DIAGNOSIS — M1A09X0 Idiopathic chronic gout, multiple sites, without tophus (tophi): Secondary | ICD-10-CM

## 2023-05-11 ENCOUNTER — Other Ambulatory Visit: Payer: Self-pay | Admitting: Internal Medicine

## 2023-05-11 DIAGNOSIS — I6523 Occlusion and stenosis of bilateral carotid arteries: Secondary | ICD-10-CM

## 2023-05-11 DIAGNOSIS — R600 Localized edema: Secondary | ICD-10-CM

## 2023-05-11 DIAGNOSIS — I10 Essential (primary) hypertension: Secondary | ICD-10-CM

## 2023-05-18 ENCOUNTER — Other Ambulatory Visit: Payer: Self-pay | Admitting: Internal Medicine

## 2023-05-18 DIAGNOSIS — E118 Type 2 diabetes mellitus with unspecified complications: Secondary | ICD-10-CM

## 2023-05-18 DIAGNOSIS — E1122 Type 2 diabetes mellitus with diabetic chronic kidney disease: Secondary | ICD-10-CM

## 2023-05-18 MED ORDER — DAPAGLIFLOZIN PROPANEDIOL 10 MG PO TABS
10.0000 mg | ORAL_TABLET | Freq: Every day | ORAL | 0 refills | Status: DC
Start: 2023-05-18 — End: 2023-08-07

## 2023-05-23 ENCOUNTER — Encounter: Payer: Self-pay | Admitting: Internal Medicine

## 2023-05-23 ENCOUNTER — Ambulatory Visit (INDEPENDENT_AMBULATORY_CARE_PROVIDER_SITE_OTHER): Payer: Medicare Other

## 2023-05-23 ENCOUNTER — Ambulatory Visit (INDEPENDENT_AMBULATORY_CARE_PROVIDER_SITE_OTHER): Payer: Medicare Other | Admitting: Internal Medicine

## 2023-05-23 VITALS — BP 136/84 | HR 77 | Temp 97.6°F | Ht 71.0 in | Wt 218.4 lb

## 2023-05-23 DIAGNOSIS — R059 Cough, unspecified: Secondary | ICD-10-CM | POA: Diagnosis not present

## 2023-05-23 DIAGNOSIS — R052 Subacute cough: Secondary | ICD-10-CM | POA: Insufficient documentation

## 2023-05-23 DIAGNOSIS — N183 Chronic kidney disease, stage 3 unspecified: Secondary | ICD-10-CM | POA: Diagnosis not present

## 2023-05-23 DIAGNOSIS — E118 Type 2 diabetes mellitus with unspecified complications: Secondary | ICD-10-CM

## 2023-05-23 DIAGNOSIS — E1122 Type 2 diabetes mellitus with diabetic chronic kidney disease: Secondary | ICD-10-CM | POA: Diagnosis not present

## 2023-05-23 DIAGNOSIS — E538 Deficiency of other specified B group vitamins: Secondary | ICD-10-CM | POA: Diagnosis not present

## 2023-05-23 DIAGNOSIS — J22 Unspecified acute lower respiratory infection: Secondary | ICD-10-CM | POA: Insufficient documentation

## 2023-05-23 DIAGNOSIS — I771 Stricture of artery: Secondary | ICD-10-CM | POA: Diagnosis not present

## 2023-05-23 DIAGNOSIS — J9811 Atelectasis: Secondary | ICD-10-CM | POA: Diagnosis not present

## 2023-05-23 LAB — BASIC METABOLIC PANEL
BUN: 34 mg/dL — ABNORMAL HIGH (ref 6–23)
CO2: 30 meq/L (ref 19–32)
Calcium: 9.3 mg/dL (ref 8.4–10.5)
Chloride: 97 meq/L (ref 96–112)
Creatinine, Ser: 1.32 mg/dL (ref 0.40–1.50)
GFR: 49.46 mL/min — ABNORMAL LOW (ref >60.00)
Glucose, Bld: 87 mg/dL (ref 70–99)
Potassium: 3.9 meq/L (ref 3.5–5.1)
Sodium: 139 meq/L (ref 135–145)

## 2023-05-23 LAB — URINALYSIS, ROUTINE W REFLEX MICROSCOPIC
Bilirubin Urine: NEGATIVE
Hgb urine dipstick: NEGATIVE
Ketones, ur: NEGATIVE
Leukocytes,Ua: NEGATIVE
Nitrite: NEGATIVE
RBC / HPF: NONE SEEN (ref 0–?)
Specific Gravity, Urine: 1.01 (ref 1.000–1.030)
Total Protein, Urine: NEGATIVE
Urine Glucose: 250 — AB
Urobilinogen, UA: 0.2 (ref 0.0–1.0)
pH: 6 (ref 5.0–8.0)

## 2023-05-23 LAB — CBC WITH DIFFERENTIAL/PLATELET
Basophils Absolute: 0.1 10*3/uL (ref 0.0–0.1)
Basophils Relative: 0.6 % (ref 0.0–3.0)
Eosinophils Absolute: 0.2 10*3/uL (ref 0.0–0.7)
Eosinophils Relative: 2.1 % (ref 0.0–5.0)
HCT: 44.3 % (ref 39.0–52.0)
Hemoglobin: 14.5 g/dL (ref 13.0–17.0)
Lymphocytes Relative: 21.8 % (ref 12.0–46.0)
Lymphs Abs: 1.8 10*3/uL (ref 0.7–4.0)
MCHC: 32.8 g/dL (ref 30.0–36.0)
MCV: 93.9 fL (ref 78.0–100.0)
Monocytes Absolute: 0.6 10*3/uL (ref 0.1–1.0)
Monocytes Relative: 7.7 % (ref 3.0–12.0)
Neutro Abs: 5.7 10*3/uL (ref 1.4–7.7)
Neutrophils Relative %: 67.8 % (ref 43.0–77.0)
Platelets: 265 10*3/uL (ref 150.0–400.0)
RBC: 4.71 Mil/uL (ref 4.22–5.81)
RDW: 14.9 % (ref 11.5–15.5)
WBC: 8.4 10*3/uL (ref 4.0–10.5)

## 2023-05-23 LAB — URIC ACID: Uric Acid, Serum: 7.5 mg/dL (ref 4.0–7.8)

## 2023-05-23 LAB — HEMOGLOBIN A1C: Hgb A1c MFr Bld: 7.1 % — ABNORMAL HIGH (ref 4.6–6.5)

## 2023-05-23 MED ORDER — AMOXICILLIN-POT CLAVULANATE 875-125 MG PO TABS: 1.0000 | ORAL_TABLET | Freq: Two times a day (BID) | ORAL | 0 refills | Status: AC

## 2023-05-23 NOTE — Patient Instructions (Signed)

## 2023-05-23 NOTE — Progress Notes (Signed)
 Subjective:  Patient ID: Louis Kent, male    DOB: [birth year]  Age: 83 y.o. MRN: [id number]  CC: Diabetes   HPI Louis Kent presents for f/up ----  Discussed the use of AI scribe software for clinical note transcription with the patient, who gave verbal consent to proceed.  History of Present Illness   The patient, with a history of kidney disease, presents with symptoms consistent with an upper respiratory tract infection that started approximately 10 days ago. He initially experienced a runny nose, which he described as 'running like a faucet,' and later developed a cough. The patient denies any production of phlegm or blood with the cough. He also denies any associated fever, chills, or night sweats. Over the last three days, the patient reports an improvement in his nasal symptoms, but he continues to experience some residual symptoms.  For symptom management, the patient has been taking over-the-counter cold medications, which include a combination of anti-cough and anti-runny nose agents, along with acetaminophen. He has also been taking acetaminophen regularly for hand problems. The patient has been avoiding Advil due to his kidney disease.  In addition to his respiratory symptoms, the patient mentions that he has been experiencing issues with his toenails, which he believes need trimming. He has an upcoming eye exam scheduled, as his last one was approximately a year ago.       Outpatient Medications Prior to Visit  Medication Sig Dispense Refill   allopurinol  (ZYLOPRIM ) 100 MG tablet Take 1 tablet (100 mg total) by mouth daily. 45 tablet 1   atorvastatin  (LIPITOR) 10 MG tablet Take 1 tablet (10 mg total) by mouth daily. 90 tablet 0   Calcium  Carb-Cholecalciferol  (CALCIUM -VITAMIN D3) 600-400 MG-UNIT CAPS Take 1 capsule by mouth daily.     Cholecalciferol  (VITAMIN D3) 50 MCG (2000 UT) capsule TAKE 1 CAPSULE BY MOUTH EVERY DAY 90 capsule 1   dapagliflozin   propanediol (FARXIGA ) 10 MG TABS tablet Take 1 tablet (10 mg total) by mouth daily before breakfast. 90 tablet 0   irbesartan  (AVAPRO ) 300 MG tablet TAKE 1 TABLET BY MOUTH EVERY DAY 90 tablet 0   Multiple Vitamins-Minerals (MULTIVITAMIN WITH MINERALS) tablet Take by mouth.     Omega-3 1400 MG CAPS Take by mouth.     torsemide  (DEMADEX ) 10 MG tablet TAKE 1 TABLET BY MOUTH EVERY DAY 90 tablet 0   colchicine  0.6 MG tablet TAKE 1 TABLET BY MOUTH EVERY DAY 30 tablet 0   No facility-administered medications prior to visit.    ROS Review of Systems  Constitutional:  Negative for activity change, appetite change, chills, diaphoresis, fatigue, fever and unexpected weight change.  HENT:  Positive for congestion, postnasal drip and rhinorrhea. Negative for facial swelling, nosebleeds, sinus pressure, sinus pain, sore throat, trouble swallowing and voice change.   Respiratory:  Positive for cough. Negative for chest tightness, shortness of breath and wheezing.   Cardiovascular:  Negative for chest pain, palpitations and leg swelling.  Gastrointestinal:  Negative for abdominal pain, constipation, diarrhea, nausea and vomiting.  Genitourinary:  Negative for difficulty urinating and dysuria.  Musculoskeletal:  Positive for arthralgias. Negative for back pain and myalgias.  Skin: Negative.   Neurological:  Negative for dizziness and weakness.  Hematological:  Negative for adenopathy. Does not bruise/bleed easily.  Psychiatric/Behavioral: Negative.      Objective:  BP 136/84 (BP Location: Left Arm, Patient Position: Sitting, Cuff Size: Normal)   Pulse 77   Temp  97.6 F (36.4 C) (Oral)   Ht 5' 11 (1.803 m)   Wt 218 lb 6.4 oz (99.1 kg)   SpO2 93%   BMI 30.46 kg/m   BP Readings from Last 3 Encounters:  05/23/23 136/84  04/07/23 130/70  02/15/23 (!) 142/90    Wt Readings from Last 3 Encounters:  05/23/23 218 lb 6.4 oz (99.1 kg)  04/07/23 224 lb (101.6 kg)  02/15/23 224 lb (101.6 kg)     Physical Exam Vitals reviewed.  HENT:     Nose: Nose normal.     Mouth/Throat:     Mouth: Mucous membranes are moist.  Eyes:     General: No scleral icterus.    Conjunctiva/sclera: Conjunctivae normal.  Cardiovascular:     Rate and Rhythm: Normal rate and regular rhythm.     Heart sounds: No murmur heard.    No friction rub. No gallop.  Pulmonary:     Effort: Pulmonary effort is normal.     Breath sounds: No stridor or decreased air movement. Examination of the right-middle field reveals rhonchi. Examination of the left-middle field reveals rhonchi. Examination of the right-lower field reveals rhonchi. Rhonchi present. No decreased breath sounds, wheezing or rales.  Abdominal:     General: Abdomen is flat.     Palpations: There is no mass.     Tenderness: There is no abdominal tenderness. There is no guarding.     Hernia: No hernia is present.  Musculoskeletal:        General: Normal range of motion.     Cervical back: Neck supple.     Right lower leg: No edema.     Left lower leg: No edema.  Lymphadenopathy:     Cervical: No cervical adenopathy.  Skin:    General: Skin is warm and dry.  Neurological:     General: No focal deficit present.     Mental Status: He is alert. Mental status is at baseline.  Psychiatric:        Mood and Affect: Mood normal.        Behavior: Behavior normal.     Lab Results  Component Value Date   WBC 8.4 05/23/2023   HGB 14.5 05/23/2023   HCT 44.3 05/23/2023   PLT 265.0 05/23/2023   GLUCOSE 87 05/23/2023   CHOL 150 02/13/2023   TRIG 207.0 (H) 02/13/2023   HDL 68.20 02/13/2023   LDLDIRECT 47.0 03/10/2021   LDLCALC 41 02/13/2023   ALT 18 02/13/2023   AST 23 02/13/2023   NA 139 05/23/2023   K 3.9 05/23/2023   CL 97 05/23/2023   CREATININE 1.32 05/23/2023   BUN 34 (H) 05/23/2023   CO2 30 05/23/2023   TSH 2.05 02/13/2023   PSA 0.00 (L) 09/15/2021   HGBA1C 7.1 (H) 05/23/2023   MICROALBUR 1.2 02/13/2023    DG Chest 2  View Result Date: 05/23/2023 CLINICAL DATA:  Cough. EXAM: CHEST - 2 VIEW COMPARISON:  None Available. FINDINGS: Shallow inspiration with minimal bibasilar atelectasis. No focal consolidation, pleural effusion, or pneumothorax. The cardiac silhouette is within normal limits. The aorta is tortuous. Degenerative changes of the spine. No acute osseous pathology. IMPRESSION: No active cardiopulmonary disease. Electronically Signed   By: Vanetta Chou M.D.   On: 05/23/2023 11:55       Assessment & Plan:  B12 nutritional deficiency -     CBC with Differential/Platelet; Future  CKD stage 3 due to type 2 diabetes mellitus (HCC) - Will avoid nephrotoxic agents  -  Cystatin C with Glomerular Filtration Rate, Estimated (eGFR); Future -     Uric acid; Future -     Urinalysis, Routine w reflex microscopic; Future -     Basic metabolic panel; Future  Type II diabetes mellitus with manifestations (HCC)- Blood sugar is well controlled. -     HM Diabetes Foot Exam -     Urinalysis, Routine w reflex microscopic; Future -     Hemoglobin A1c; Future  Subacute cough- CXR is negative for mass/infiltrate. -     DG Chest 2 View; Future  LRTI (lower respiratory tract infection) -     Amoxicillin -Pot Clavulanate; Take 1 tablet by mouth 2 (two) times daily for 7 days.  Dispense: 14 tablet; Refill: 0     Follow-up: Return in about 6 months (around 11/20/2023).  Debby Molt, MD

## 2023-05-26 LAB — CYSTATIN C WITH GLOMERULAR FILTRATION RATE, ESTIMATED (EGFR)
CYSTATIN C: 2.22 mg/L — ABNORMAL HIGH (ref 0.52–1.13)
eGFR: 24 mL/min/{1.73_m2} — ABNORMAL LOW (ref >=60)

## 2023-05-30 LAB — HM DIABETES EYE EXAM

## 2023-05-30 NOTE — Progress Notes (Signed)
 Ben Ashlan Dignan D.[facility] [address] [place] [address] Phone: [phone]   Assessment and Plan:     1. Bilateral hand pain 2. Bilateral hand numbness 3. Localized osteoarthritis of hands, bilateral -Chronic with exacerbation, subsequent visit - Overall no change in symptoms with patient continuing to have bilateral hand pain, bilateral hand numbness and tingling, typically worse at night.  Symptoms are likely multifactorial including flares of osteoarthritis, neurologic cause of numbness and tingling - Patient's numbness and tingling could be due to neuropathy (potentially diabetic) versus carpal tunnel.  HPI and numbness and tingling are consistent with carpal tunnel, however negative Tinel's, Phalen's, Prayer tests on physical exam.  Patient elected to trial carpal tunnel CSI to right hand to see if this improves symptoms.  Tolerated well per note below.  Corticosteroid could temporarily increase blood glucose in patient with past medical history of diabetes type 2 - Discussed trialing gabapentin, though patient is wary of starting this medication due to potential side effects.  Elected to not start gabapentin at this time  Procedure: Ultrasound Guided Carpal Tunnel Injection Side: Right Diagnosis: Right hand pain and numbness and tingling US  Indication:  - accuracy is paramount for diagnosis - to ensure therapeutic efficacy or procedural success - to reduce procedural risk  After explaining the procedure, viable alternatives, risks, and answering any questions, consent was given verbally. The site was cleaned with chlorhexidine prep. An ultrasound transducer was placed on the volar aspect of the wrist.  The median nerve was identified.   An injection was performed under ultrasound guidance from the ulnar approach with care used to avoid ulnar artery.  1ml of 1% lidocaine without epinephrine was used to hydrodissect the median nerve from the  retinaculum.  40 mg of triamcinolone  (KENALOG) 40mg /ml was then deposited adjacent to but not into the median nerve.  This was well tolerated and resulted in  relief.  Needle was removed and dressing placed and post injection instructions were given including  a discussion of likely return of pain today after the anesthetic wears off (with the possibility of worsened pain) until the steroid starts to work in 1-3 days.   Pt was advised to call or return to clinic if these symptoms worsen or fail to improve as anticipated.  15 additional minutes spent for educating Therapeutic Home Exercise Program.  This included exercises focusing on stretching, strengthening, with focus on eccentric aspects.   Long term goals include an improvement in range of motion, strength, endurance as well as avoiding reinjury. Patient's frequency would include in 1-2 times a day, 3-5 times a week for a duration of 6-12 weeks. Proper technique shown and discussed handout in great detail with ATC.  All questions were discussed and answered.    4. Chronic gout of multiple sites, unspecified cause - Chronic, improving, subsequent visit - After increasing to allopurinol  100 mg, patient's uric acid further decreased from 7.8 on 03/31/2023 to 7.5 on 05/23/2023.  GFR stayed stable at 49. - We discussed that ideally we would decrease uric acid to <6.0 in patients with chronic gout, however patient's current symptoms are not consistent with gout.  Denies redness, swelling, warmth to joints.  With history of CKD, I do not feel that the potential benefit of increasing allopurinol  at this time outweighs the risk of worsening CKD.  Will plan on continuing allopurinol  at 100 mg daily and continue regular monitoring of kidney function   Pertinent previous records reviewed include lab  work from 05/23/2023  Follow Up: 3 weeks for reevaluation after carpal tunnel CSI.  If right hand as improvement, we could consider left hand CSI.  If no improvement,  could discuss gabapentin   Subjective:   I, Moenique Parris, am serving as a Neurosurgeon for Doctor Ulysees Gander   Chief Complaint: bilat elbow pain    HPI:    02/15/23 Patient is a 83 year old male complaining of bilat elbow pain. Patient states that he had bilat hand pain and the pain has radiated to his elbows . Decreased grip strength and numbness and tingling. Pain for the last 2-3 month that have increased over the last week. Meloxicam  did help with his hand and elbow pain but it didn't take the pain away. Pain is worse at night and once he get them moving the pain eases.    04/07/2023 Patient states that he still has pain and numbness in his hands   05/31/2023 Patient states that he is the same    Relevant Historical Information: Hypertension, CKD, DM type II Additional pertinent review of systems negative.   Current Outpatient Medications:    allopurinol  (ZYLOPRIM ) 100 MG tablet, Take 1 tablet (100 mg total) by mouth daily., Disp: 45 tablet, Rfl: 1   atorvastatin  (LIPITOR) 10 MG tablet, Take 1 tablet (10 mg total) by mouth daily., Disp: 90 tablet, Rfl: 0   Calcium  Carb-Cholecalciferol  (CALCIUM -VITAMIN D3) 600-400 MG-UNIT CAPS, Take 1 capsule by mouth daily., Disp: , Rfl:    Cholecalciferol  (VITAMIN D3) 50 MCG (2000 UT) capsule, TAKE 1 CAPSULE BY MOUTH EVERY DAY, Disp: 90 capsule, Rfl: 1   dapagliflozin  propanediol (FARXIGA ) 10 MG TABS tablet, Take 1 tablet (10 mg total) by mouth daily before breakfast., Disp: 90 tablet, Rfl: 0   irbesartan  (AVAPRO ) 300 MG tablet, TAKE 1 TABLET BY MOUTH EVERY DAY, Disp: 90 tablet, Rfl: 0   Multiple Vitamins-Minerals (MULTIVITAMIN WITH MINERALS) tablet, Take by mouth., Disp: , Rfl:    Omega-3 1400 MG CAPS, Take by mouth., Disp: , Rfl:    torsemide  (DEMADEX ) 10 MG tablet, TAKE 1 TABLET BY MOUTH EVERY DAY, Disp: 90 tablet, Rfl: 0   Objective:     Vitals:   05/31/23 1002  Pulse: 96  SpO2: 97%  Weight: 221 lb (100.2 kg)  Height: 5\' 11"  (1.803  m)      Body mass index is 30.82 kg/m.    Physical Exam:    General: Appears well, nad, nontoxic and pleasant Neuro:sensation intact, strength is 5/5 in upper extremities, muscle tone wnl Skin:no susupicious lesions or rashes   Bilateral hand/wrist:   No deformity or swelling appreciated. ROM  Ext 90, flexion70, radial/ulnar deviation 30 nttp over the snuff box, dorsal carpals, volar carpals, radial styloid, ulnar styloid, 1st mcp, tfcc Negative Tinel's, Phalen's, Prayer Tests Negative finklestein Neg tfcc bounce test No pain with resisted ext, flex or deviation       Electronically signed by:  Marshall Skeeter D.[facility] 10:37 AM 05/31/23

## 2023-05-31 ENCOUNTER — Other Ambulatory Visit: Payer: Self-pay

## 2023-05-31 ENCOUNTER — Ambulatory Visit: Payer: Medicare Other | Admitting: Sports Medicine

## 2023-05-31 VITALS — HR 96 | Ht 71.0 in | Wt 221.0 lb

## 2023-05-31 DIAGNOSIS — R200 Anesthesia of skin: Secondary | ICD-10-CM | POA: Diagnosis not present

## 2023-05-31 DIAGNOSIS — M19042 Primary osteoarthritis, left hand: Secondary | ICD-10-CM | POA: Diagnosis not present

## 2023-05-31 DIAGNOSIS — M79642 Pain in left hand: Secondary | ICD-10-CM | POA: Diagnosis not present

## 2023-05-31 DIAGNOSIS — M79641 Pain in right hand: Secondary | ICD-10-CM

## 2023-05-31 DIAGNOSIS — M19041 Primary osteoarthritis, right hand: Secondary | ICD-10-CM

## 2023-05-31 DIAGNOSIS — M1A09X0 Idiopathic chronic gout, multiple sites, without tophus (tophi): Secondary | ICD-10-CM | POA: Diagnosis not present

## 2023-05-31 NOTE — Patient Instructions (Signed)
 Wrist HEP  3 week follow up

## 2023-06-01 DIAGNOSIS — H2513 Age-related nuclear cataract, bilateral: Secondary | ICD-10-CM | POA: Diagnosis not present

## 2023-06-01 DIAGNOSIS — H33302 Unspecified retinal break, left eye: Secondary | ICD-10-CM | POA: Diagnosis not present

## 2023-06-01 DIAGNOSIS — H43813 Vitreous degeneration, bilateral: Secondary | ICD-10-CM | POA: Diagnosis not present

## 2023-06-01 DIAGNOSIS — H524 Presbyopia: Secondary | ICD-10-CM | POA: Diagnosis not present

## 2023-06-01 DIAGNOSIS — H25013 Cortical age-related cataract, bilateral: Secondary | ICD-10-CM | POA: Diagnosis not present

## 2023-06-01 DIAGNOSIS — H353132 Nonexudative age-related macular degeneration, bilateral, intermediate dry stage: Secondary | ICD-10-CM | POA: Diagnosis not present

## 2023-06-05 ENCOUNTER — Other Ambulatory Visit: Payer: Self-pay | Admitting: Internal Medicine

## 2023-06-05 DIAGNOSIS — E785 Hyperlipidemia, unspecified: Secondary | ICD-10-CM

## 2023-06-05 DIAGNOSIS — I6523 Occlusion and stenosis of bilateral carotid arteries: Secondary | ICD-10-CM

## 2023-06-06 DIAGNOSIS — Z85828 Personal history of other malignant neoplasm of skin: Secondary | ICD-10-CM | POA: Diagnosis not present

## 2023-06-06 DIAGNOSIS — L570 Actinic keratosis: Secondary | ICD-10-CM | POA: Diagnosis not present

## 2023-06-06 DIAGNOSIS — I872 Venous insufficiency (chronic) (peripheral): Secondary | ICD-10-CM | POA: Diagnosis not present

## 2023-06-06 DIAGNOSIS — L309 Dermatitis, unspecified: Secondary | ICD-10-CM | POA: Diagnosis not present

## 2023-06-06 DIAGNOSIS — L308 Other specified dermatitis: Secondary | ICD-10-CM | POA: Diagnosis not present

## 2023-06-06 DIAGNOSIS — I8311 Varicose veins of right lower extremity with inflammation: Secondary | ICD-10-CM | POA: Diagnosis not present

## 2023-06-06 DIAGNOSIS — L820 Inflamed seborrheic keratosis: Secondary | ICD-10-CM | POA: Diagnosis not present

## 2023-06-06 DIAGNOSIS — L821 Other seborrheic keratosis: Secondary | ICD-10-CM | POA: Diagnosis not present

## 2023-06-06 DIAGNOSIS — I8312 Varicose veins of left lower extremity with inflammation: Secondary | ICD-10-CM | POA: Diagnosis not present

## 2023-06-20 NOTE — Progress Notes (Signed)
 [facility] [address] [place] [address] Phone: [phone]   Assessment and Plan:     1. Bilateral hand pain 2. Bilateral hand numbness 3. Localized osteoarthritis of hands, bilateral 4. Chronic gout of multiple sites, unspecified cause  -Chronic with exacerbation, subsequent visit - Overall significant change in bilateral hand pain.  Symptoms were most likely due to flare of osteoarthritis causing localized pain and tingling in bilateral hands - Patient did not feel significantly different after right hand carpal tunnel CSI, and currently both hands are improved, so I do not feel additional carpal tunnel CSI would be beneficial. - Patient does have past medical history of gout with most recent uric acid 7.5 on 05/23/2023.  Ideally we would like uric acid to be <6.0 in patients with chronic gout, however patient does not appear to have had acute gout flares.  With history of CKD, we must weigh risk-benefit of increasing allopurinol .  Recommend continuing same dose of allopurinol  100 mg daily - Continue Tylenol for day-to-day pain relief - Recommend warm hand baths, Voltaren gel use for hand pain  Pertinent previous records reviewed include none  Follow Up: As needed   Subjective:   I, Louis Kent, am serving as a neurosurgeon for Doctor [employer]   Chief Complaint: bilat elbow pain    HPI:    02/15/23 Patient is a 83 year old male complaining of bilat elbow pain. Patient states that he had bilat hand pain and the pain has radiated to his elbows . Decreased grip strength and numbness and tingling. Pain for the last 2-3 month that have increased over the last week. Meloxicam  did help with his hand and elbow pain but it didn't take the pain away. Pain is worse at night and once he get them moving the pain eases.    04/07/2023 Patient states that he still has pain and numbness in his hands    05/31/2023 Patient states  that he is the same   06/21/2023 Patient states that he has been doing much better. He has been able to sleep at night now. Stiff in the morning but not getting the pain that was keeping him up in the morning.    Relevant Historical Information: Hypertension, CKD, DM type II  Additional pertinent review of systems negative.   Current Outpatient Medications:    allopurinol  (ZYLOPRIM ) 100 MG tablet, Take 1 tablet (100 mg total) by mouth daily., Disp: 45 tablet, Rfl: 1   atorvastatin  (LIPITOR) 10 MG tablet, TAKE 1 TABLET BY MOUTH EVERY DAY, Disp: 90 tablet, Rfl: 0   Calcium  Carb-Cholecalciferol  (CALCIUM -VITAMIN D3) 600-400 MG-UNIT CAPS, Take 1 capsule by mouth daily., Disp: , Rfl:    Cholecalciferol  (VITAMIN D3) 50 MCG (2000 UT) capsule, TAKE 1 CAPSULE BY MOUTH EVERY DAY, Disp: 90 capsule, Rfl: 1   dapagliflozin  propanediol (FARXIGA ) 10 MG TABS tablet, Take 1 tablet (10 mg total) by mouth daily before breakfast., Disp: 90 tablet, Rfl: 0   irbesartan  (AVAPRO ) 300 MG tablet, TAKE 1 TABLET BY MOUTH EVERY DAY, Disp: 90 tablet, Rfl: 0   Multiple Vitamins-Minerals (MULTIVITAMIN WITH MINERALS) tablet, Take by mouth., Disp: , Rfl:    Omega-3 1400 MG CAPS, Take by mouth., Disp: , Rfl:    torsemide  (DEMADEX ) 10 MG tablet, TAKE 1 TABLET BY MOUTH EVERY DAY, Disp: 90 tablet, Rfl: 0   Objective:     Vitals:   06/21/23 0948  BP: 138/70  Pulse: 91  SpO2:  94%  Weight: 218 lb 9.6 oz (99.2 kg)  Height: 5' 11 (1.803 m)      Body mass index is 30.49 kg/m.    Physical Exam:    General: Appears well, nad, nontoxic and pleasant Neuro:sensation intact, strength is 5/5 in upper extremities, muscle tone wnl Skin:no susupicious lesions or rashes   Bilateral hand/wrist:   No deformity or swelling appreciated. ROM  Ext 90, flexion70, radial/ulnar deviation 30 nttp over the snuff box, dorsal carpals, volar carpals, radial styloid, ulnar styloid, 1st mcp, tfcc Negative Tinel's, Phalen's, Prayer  Tests Negative finklestein Neg tfcc bounce test No pain with resisted ext, flex or deviation       Electronically signed by:  [facility] 10:04 AM 06/21/23

## 2023-06-21 ENCOUNTER — Ambulatory Visit (INDEPENDENT_AMBULATORY_CARE_PROVIDER_SITE_OTHER): Payer: Medicare Other | Admitting: Sports Medicine

## 2023-06-21 VITALS — BP 138/70 | HR 91 | Ht 71.0 in | Wt 218.6 lb

## 2023-06-21 DIAGNOSIS — M1A09X0 Idiopathic chronic gout, multiple sites, without tophus (tophi): Secondary | ICD-10-CM

## 2023-06-21 DIAGNOSIS — M79642 Pain in left hand: Secondary | ICD-10-CM

## 2023-06-21 DIAGNOSIS — M79641 Pain in right hand: Secondary | ICD-10-CM

## 2023-06-21 DIAGNOSIS — M19042 Primary osteoarthritis, left hand: Secondary | ICD-10-CM

## 2023-06-21 DIAGNOSIS — R200 Anesthesia of skin: Secondary | ICD-10-CM

## 2023-06-21 DIAGNOSIS — M19041 Primary osteoarthritis, right hand: Secondary | ICD-10-CM

## 2023-06-28 DIAGNOSIS — H33302 Unspecified retinal break, left eye: Secondary | ICD-10-CM | POA: Diagnosis not present

## 2023-06-28 DIAGNOSIS — H2512 Age-related nuclear cataract, left eye: Secondary | ICD-10-CM | POA: Diagnosis not present

## 2023-06-28 DIAGNOSIS — H43812 Vitreous degeneration, left eye: Secondary | ICD-10-CM | POA: Diagnosis not present

## 2023-06-29 DIAGNOSIS — H6123 Impacted cerumen, bilateral: Secondary | ICD-10-CM | POA: Diagnosis not present

## 2023-07-13 ENCOUNTER — Other Ambulatory Visit: Payer: Self-pay | Admitting: Sports Medicine

## 2023-07-13 DIAGNOSIS — M1A09X0 Idiopathic chronic gout, multiple sites, without tophus (tophi): Secondary | ICD-10-CM

## 2023-08-06 ENCOUNTER — Other Ambulatory Visit: Payer: Self-pay | Admitting: Internal Medicine

## 2023-08-06 DIAGNOSIS — E1122 Type 2 diabetes mellitus with diabetic chronic kidney disease: Secondary | ICD-10-CM

## 2023-08-06 DIAGNOSIS — N183 Chronic kidney disease, stage 3 unspecified: Secondary | ICD-10-CM

## 2023-08-06 DIAGNOSIS — E118 Type 2 diabetes mellitus with unspecified complications: Secondary | ICD-10-CM

## 2023-08-07 ENCOUNTER — Other Ambulatory Visit: Payer: Self-pay | Admitting: Internal Medicine

## 2023-08-07 DIAGNOSIS — I10 Essential (primary) hypertension: Secondary | ICD-10-CM

## 2023-08-07 DIAGNOSIS — R600 Localized edema: Secondary | ICD-10-CM

## 2023-08-07 DIAGNOSIS — I6523 Occlusion and stenosis of bilateral carotid arteries: Secondary | ICD-10-CM

## 2023-09-05 ENCOUNTER — Other Ambulatory Visit: Payer: Self-pay | Admitting: Internal Medicine

## 2023-09-05 DIAGNOSIS — I6523 Occlusion and stenosis of bilateral carotid arteries: Secondary | ICD-10-CM

## 2023-09-05 DIAGNOSIS — E785 Hyperlipidemia, unspecified: Secondary | ICD-10-CM

## 2023-09-26 ENCOUNTER — Telehealth: Payer: Self-pay | Admitting: Internal Medicine

## 2023-09-26 ENCOUNTER — Other Ambulatory Visit: Payer: Self-pay | Admitting: Internal Medicine

## 2023-09-26 DIAGNOSIS — I6523 Occlusion and stenosis of bilateral carotid arteries: Secondary | ICD-10-CM

## 2023-09-26 DIAGNOSIS — E785 Hyperlipidemia, unspecified: Secondary | ICD-10-CM

## 2023-09-26 DIAGNOSIS — H6123 Impacted cerumen, bilateral: Secondary | ICD-10-CM | POA: Diagnosis not present

## 2023-09-26 NOTE — Telephone Encounter (Signed)
 Patient called and said the pharmacy has not received the refill of his atorvastatin  (LIPITOR) 10 MG tablet. It was sent yesterday, but says the transmission failed. Pleas resend medication to patient's pharmacy.

## 2023-09-27 ENCOUNTER — Other Ambulatory Visit: Payer: Self-pay

## 2023-09-27 ENCOUNTER — Telehealth: Payer: Self-pay | Admitting: Internal Medicine

## 2023-09-27 DIAGNOSIS — E785 Hyperlipidemia, unspecified: Secondary | ICD-10-CM

## 2023-09-27 DIAGNOSIS — I6523 Occlusion and stenosis of bilateral carotid arteries: Secondary | ICD-10-CM

## 2023-09-27 MED ORDER — ATORVASTATIN CALCIUM 10 MG PO TABS: 10.0000 mg | ORAL_TABLET | Freq: Every day | ORAL | 1 refills | Status: DC

## 2023-09-27 NOTE — Telephone Encounter (Signed)
 Medication has been resent to his pharmacy. Spoke with the pharmacy and the patient.

## 2023-09-27 NOTE — Telephone Encounter (Signed)
 Medication has been refilled and sent to his local pharmacy. Patient has been made aware VIA me leaving a voice message on his phone.

## 2023-09-27 NOTE — Telephone Encounter (Signed)
 Copied from CRM [id number]. Topic: Clinical - Prescription Issue >> Sep 26, 2023  4:44 PM Tiffany S wrote: Reason for CRM: Advised caller CMA will resubmit prescription >> Sep 27, 2023  9:32 AM Howard Macho wrote: Patient called stating he spoke to someone in regards to a medication refill for atorvastatin  on yesterday.  The patient stated someone told him yesterday that the prescription had been canceled and he stated he know the doctor would not do that. The patient stated he went to the pharmacy four times and they keep telling him the doctor has not responded. Patient wanted to know if the doctor was working. I did let the patient know that he was and I will send this message over to him. CB [phone]

## 2023-10-31 ENCOUNTER — Other Ambulatory Visit: Payer: Self-pay | Admitting: Internal Medicine

## 2023-10-31 DIAGNOSIS — I10 Essential (primary) hypertension: Secondary | ICD-10-CM

## 2023-10-31 DIAGNOSIS — R600 Localized edema: Secondary | ICD-10-CM

## 2023-11-09 ENCOUNTER — Encounter: Payer: Self-pay | Admitting: Internal Medicine

## 2023-11-22 ENCOUNTER — Other Ambulatory Visit: Payer: Self-pay | Admitting: Internal Medicine

## 2023-11-22 DIAGNOSIS — I6523 Occlusion and stenosis of bilateral carotid arteries: Secondary | ICD-10-CM

## 2023-11-22 DIAGNOSIS — I10 Essential (primary) hypertension: Secondary | ICD-10-CM

## 2023-11-30 ENCOUNTER — Ambulatory Visit: Payer: Self-pay | Admitting: Internal Medicine

## 2023-11-30 ENCOUNTER — Encounter: Payer: Self-pay | Admitting: Internal Medicine

## 2023-11-30 ENCOUNTER — Ambulatory Visit: Admitting: Internal Medicine

## 2023-11-30 VITALS — BP 148/76 | HR 79 | Temp 98.4°F | Resp 16 | Ht 71.0 in | Wt 222.0 lb

## 2023-11-30 DIAGNOSIS — Z7985 Long-term (current) use of injectable non-insulin antidiabetic drugs: Secondary | ICD-10-CM

## 2023-11-30 DIAGNOSIS — E118 Type 2 diabetes mellitus with unspecified complications: Secondary | ICD-10-CM | POA: Diagnosis not present

## 2023-11-30 DIAGNOSIS — N183 Chronic kidney disease, stage 3 unspecified: Secondary | ICD-10-CM | POA: Diagnosis not present

## 2023-11-30 DIAGNOSIS — I10 Essential (primary) hypertension: Secondary | ICD-10-CM | POA: Diagnosis not present

## 2023-11-30 DIAGNOSIS — N401 Enlarged prostate with lower urinary tract symptoms: Secondary | ICD-10-CM

## 2023-11-30 DIAGNOSIS — E538 Deficiency of other specified B group vitamins: Secondary | ICD-10-CM

## 2023-11-30 DIAGNOSIS — E785 Hyperlipidemia, unspecified: Secondary | ICD-10-CM | POA: Diagnosis not present

## 2023-11-30 DIAGNOSIS — E1122 Type 2 diabetes mellitus with diabetic chronic kidney disease: Secondary | ICD-10-CM | POA: Diagnosis not present

## 2023-11-30 DIAGNOSIS — E781 Pure hyperglyceridemia: Secondary | ICD-10-CM | POA: Diagnosis not present

## 2023-11-30 DIAGNOSIS — Z7984 Long term (current) use of oral hypoglycemic drugs: Secondary | ICD-10-CM | POA: Diagnosis not present

## 2023-11-30 DIAGNOSIS — R351 Nocturia: Secondary | ICD-10-CM | POA: Diagnosis not present

## 2023-11-30 LAB — CBC WITH DIFFERENTIAL/PLATELET
Basophils Absolute: 0.1 K/uL (ref 0.0–0.1)
Basophils Relative: 0.6 % (ref 0.0–3.0)
Eosinophils Absolute: 0.1 K/uL (ref 0.0–0.7)
Eosinophils Relative: 1.8 % (ref 0.0–5.0)
HCT: 45.9 % (ref 39.0–52.0)
Hemoglobin: 15.1 g/dL (ref 13.0–17.0)
Lymphocytes Relative: 15.3 % (ref 12.0–46.0)
Lymphs Abs: 1.3 K/uL (ref 0.7–4.0)
MCHC: 32.8 g/dL (ref 30.0–36.0)
MCV: 91.7 fl (ref 78.0–100.0)
Monocytes Absolute: 0.7 K/uL (ref 0.1–1.0)
Monocytes Relative: 8.7 % (ref 3.0–12.0)
Neutro Abs: 6.2 K/uL (ref 1.4–7.7)
Neutrophils Relative %: 73.6 % (ref 43.0–77.0)
Platelets: 202 K/uL (ref 150.0–400.0)
RBC: 5.01 Mil/uL (ref 4.22–5.81)
RDW: 15.5 % (ref 11.5–15.5)
WBC: 8.5 K/uL (ref 4.0–10.5)

## 2023-11-30 LAB — URINALYSIS, ROUTINE W REFLEX MICROSCOPIC
Bilirubin Urine: NEGATIVE
Hgb urine dipstick: NEGATIVE
Ketones, ur: NEGATIVE
Leukocytes,Ua: NEGATIVE
Nitrite: NEGATIVE
RBC / HPF: NONE SEEN (ref 0–?)
Specific Gravity, Urine: <=1.005 — AB (ref 1.000–1.030)
Total Protein, Urine: NEGATIVE
Urine Glucose: >=1000 — AB
Urobilinogen, UA: 0.2 (ref 0.0–1.0)
WBC, UA: NONE SEEN (ref 0–?)
pH: 6.5 (ref 5.0–8.0)

## 2023-11-30 LAB — BASIC METABOLIC PANEL WITH GFR
BUN: 26 mg/dL — ABNORMAL HIGH (ref 6–23)
CO2: 28 meq/L (ref 19–32)
Calcium: 9.3 mg/dL (ref 8.4–10.5)
Chloride: 100 meq/L (ref 96–112)
Creatinine, Ser: 1.16 mg/dL (ref 0.40–1.50)
GFR: 57.55 mL/min — ABNORMAL LOW (ref >60.00)
Glucose, Bld: 148 mg/dL — ABNORMAL HIGH (ref 70–99)
Potassium: 4.2 meq/L (ref 3.5–5.1)
Sodium: 139 meq/L (ref 135–145)

## 2023-11-30 LAB — HEMOGLOBIN A1C: Hgb A1c MFr Bld: 7.4 % — ABNORMAL HIGH (ref 4.6–6.5)

## 2023-11-30 LAB — HEPATIC FUNCTION PANEL
ALT: 23 U/L (ref 0–53)
AST: 25 U/L (ref 0–37)
Albumin: 4.3 g/dL (ref 3.5–5.2)
Alkaline Phosphatase: 126 U/L — ABNORMAL HIGH (ref 39–117)
Bilirubin, Direct: 0.2 mg/dL (ref 0.0–0.3)
Total Bilirubin: 0.9 mg/dL (ref 0.2–1.2)
Total Protein: 6.8 g/dL (ref 6.0–8.3)

## 2023-11-30 LAB — LIPID PANEL
Cholesterol: 157 mg/dL (ref 0–200)
HDL: 65.2 mg/dL (ref >39.00)
LDL Cholesterol: 54 mg/dL (ref 0–99)
NonHDL: 92.21
Total CHOL/HDL Ratio: 2
Triglycerides: 189 mg/dL — ABNORMAL HIGH (ref 0.0–149.0)
VLDL: 37.8 mg/dL (ref 0.0–40.0)

## 2023-11-30 LAB — URIC ACID: Uric Acid, Serum: 6.6 mg/dL (ref 4.0–7.8)

## 2023-11-30 LAB — PSA: PSA: 0.01 ng/mL — ABNORMAL LOW (ref 0.10–4.00)

## 2023-11-30 LAB — MICROALBUMIN / CREATININE URINE RATIO
Creatinine,U: 14.7 mg/dL
Microalb Creat Ratio: UNDETERMINED mg/g (ref 0.0–30.0)
Microalb, Ur: <0.7 mg/dL

## 2023-11-30 MED ORDER — DAPAGLIFLOZIN PROPANEDIOL 10 MG PO TABS: 10.0000 mg | ORAL_TABLET | Freq: Every day | ORAL | 1 refills | Status: DC

## 2023-11-30 NOTE — Progress Notes (Signed)
 Subjective:  Patient ID: Louis Kent, male    DOB: [birth year]  Age: 83 y.o. MRN: [id number]  CC: Hypertension, Hyperlipidemia, and Diabetes   HPI Louis Kent presents for f/up ----  Discussed the use of AI scribe software for clinical note transcription with the patient, who gave verbal consent to proceed.  History of Present Illness   Louis Kent is an 83 year old male with chronic kidney disease who presents for a follow-up visit.  He feels generally well but is anxious about recent blood work, particularly concerning uric acid levels. He experiences stiffness in his hands in the morning, though the pain he previously experienced has resolved. No pain, shortness of breath, dizziness, or lightheadedness during physical activity, except for a recent episode of lightheadedness while working in the heat, attributed to dehydration.  He has stage three chronic kidney disease and is currently taking Farxiga . He is unsure about the implications of stage three kidney disease and inquires about the possibility of the condition improving or retreating. He has a history of elevated BUN levels, which he associates with the diuretic he is taking. He experienced issues with refilling his Farxiga  prescription due to a pharmacy switch and a missed request, but managed to obtain a temporary supply through a weekend nurse. He is on a 90-day cycle for his medications and tries to refill them in advance to avoid running out.  He mentions a past diagnosis of cancer and is interested in monitoring his PSA levels, which were zero two years ago.       Outpatient Medications Prior to Visit  Medication Sig Dispense Refill   allopurinol  (ZYLOPRIM ) 100 MG tablet TAKE 1 TABLET BY MOUTH EVERY DAY 90 tablet 1   atorvastatin  (LIPITOR) 10 MG tablet Take 1 tablet (10 mg total) by mouth daily. 90 tablet 1   Cholecalciferol  (VITAMIN D3) 50 MCG (2000 UT) capsule TAKE 1 CAPSULE BY MOUTH EVERY DAY 90 capsule 1    irbesartan  (AVAPRO ) 300 MG tablet TAKE 1 TABLET BY MOUTH EVERY DAY 90 tablet 0   Multiple Vitamins-Minerals (MULTIVITAMIN WITH MINERALS) tablet Take by mouth.     Omega-3 1400 MG CAPS Take by mouth.     torsemide  (DEMADEX ) 10 MG tablet TAKE 1 TABLET BY MOUTH EVERY DAY 90 tablet 0   FARXIGA  10 MG TABS tablet TAKE 1 TABLET BY MOUTH DAILY BEFORE BREAKFAST. 90 tablet 0   Calcium  Carb-Cholecalciferol  (CALCIUM -VITAMIN D3) 600-400 MG-UNIT CAPS Take 1 capsule by mouth daily.     No facility-administered medications prior to visit.    ROS Review of Systems  Constitutional:  Negative for appetite change, chills, diaphoresis, fatigue and fever.  HENT: Negative.    Eyes: Negative.   Respiratory: Negative.  Negative for cough, chest tightness, shortness of breath and wheezing.   Cardiovascular:  Negative for chest pain, palpitations and leg swelling.  Gastrointestinal:  Negative for abdominal pain, constipation, diarrhea, nausea and vomiting.  Endocrine: Negative.   Genitourinary: Negative.  Negative for difficulty urinating.  Musculoskeletal:  Positive for arthralgias. Negative for myalgias.  Skin: Negative.   Neurological: Negative.  Negative for dizziness, weakness and light-headedness.  Hematological:  Negative for adenopathy. Does not bruise/bleed easily.  Psychiatric/Behavioral: Negative.      Objective:  BP (!) 148/76 (BP Location: Left Arm, Patient Position: Sitting, Cuff Size: Normal)   Pulse 79   Temp 98.4 F (36.9 C) (Oral)   Resp 16   Ht 5' 11 (1.803 m)   Wt 222  lb (100.7 kg)   SpO2 96%   BMI 30.96 kg/m   BP Readings from Last 3 Encounters:  11/30/23 (!) 148/76  06/21/23 138/70  05/23/23 136/84    Wt Readings from Last 3 Encounters:  11/30/23 222 lb (100.7 kg)  06/21/23 218 lb 9.6 oz (99.2 kg)  05/31/23 221 lb (100.2 kg)    Physical Exam Vitals reviewed.  Constitutional:      Appearance: Normal appearance.  HENT:     Mouth/Throat:     Mouth: Mucous membranes  are moist.  Eyes:     General: No scleral icterus.    Conjunctiva/sclera: Conjunctivae normal.  Cardiovascular:     Rate and Rhythm: Normal rate and regular rhythm.     Heart sounds: No murmur heard.    No friction rub. No gallop.     Comments: EKG--- NSR with SA, 79 bpm No LVH, Q waves, or ST/T wave changes  Pulmonary:     Effort: Pulmonary effort is normal.     Breath sounds: No stridor. No wheezing, rhonchi or rales.  Abdominal:     General: Abdomen is flat. Bowel sounds are normal. There is no distension.     Palpations: There is no hepatomegaly, splenomegaly or mass.     Tenderness: There is no abdominal tenderness. There is no guarding.     Hernia: A hernia is present. Hernia is present in the ventral area.  Musculoskeletal:        General: Normal range of motion.     Cervical back: Neck supple.     Right lower leg: No edema.     Left lower leg: No edema.  Lymphadenopathy:     Cervical: No cervical adenopathy.  Skin:    General: Skin is warm and dry.  Neurological:     General: No focal deficit present.     Mental Status: He is alert.  Psychiatric:        Mood and Affect: Mood normal.        Behavior: Behavior normal.     Lab Results  Component Value Date   WBC 8.5 11/30/2023   HGB 15.1 11/30/2023   HCT 45.9 11/30/2023   PLT 202.0 11/30/2023   GLUCOSE 148 (H) 11/30/2023   CHOL 157 11/30/2023   TRIG 189.0 (H) 11/30/2023   HDL 65.20 11/30/2023   LDLDIRECT 47.0 03/10/2021   LDLCALC 54 11/30/2023   ALT 23 11/30/2023   AST 25 11/30/2023   NA 139 11/30/2023   K 4.2 11/30/2023   CL 100 11/30/2023   CREATININE 1.16 11/30/2023   BUN 26 (H) 11/30/2023   CO2 28 11/30/2023   TSH 2.05 02/13/2023   PSA 0.01 (L) 11/30/2023   HGBA1C 7.4 (H) 11/30/2023   MICROALBUR <0.7 11/30/2023    No results found.  Assessment & Plan:  Hyperlipidemia with target LDL less than 130 -     Lipid panel; Future -     Hepatic function panel; Future -     AMB Referral [facility]BCI Care  Management  B12 nutritional deficiency -     CBC with Differential/Platelet; Future  Pure hyperglyceridemia -     Lipid panel; Future -     Hepatic function panel; Future  Type II diabetes mellitus with manifestations (HCC)- A1C is up to 7.4%. Will add a GLP-1 agonist. -     Urinalysis, Routine w reflex microscopic; Future -     Hemoglobin A1c; Future -     Microalbumin / creatinine urine ratio; Future -  Basic metabolic panel with GFR; Future -     Dapagliflozin  Propanediol; Take 1 tablet (10 mg total) by mouth daily before breakfast.  Dispense: 90 tablet; Refill: 1 -     Ozempic  (0.25 or 0.5 MG/DOSE); Inject 0.25 mg into the skin once a week.  Dispense: 3 mL; Refill: 0 -     Insulin  Pen Needle; 1 Act by Does not apply route once a week.  Dispense: 100 each; Refill: 0 -     AMB Referral VBCI Care Management  CKD stage 3 due to type 2 diabetes mellitus (HCC)- Renal function has improved. -     Urinalysis, Routine w reflex microscopic; Future -     Microalbumin / creatinine urine ratio; Future -     Basic metabolic panel with GFR; Future -     Uric acid; Future -     Dapagliflozin  Propanediol; Take 1 tablet (10 mg total) by mouth daily before breakfast.  Dispense: 90 tablet; Refill: 1  BPH associated with nocturia -     PSA; Future  Essential hypertension- BP is well controlled. EKG is negative for LVH. -     EKG 12-Lead -     AMB Referral [facility]BCI Care Management     Follow-up: Return in about 6 months (around 06/01/2024).  Debby Molt, MD

## 2023-11-30 NOTE — Patient Instructions (Signed)
 Chronic Kidney Disease in Adults: What to Know Chronic kidney disease (CKD) is when lasting damage happens to the kidneys slowly over time. The kidneys are two organs that do many important things in the body. These include: Taking waste and extra fluid out of the blood to make pee (urine). Making hormones. Keeping the right amount of fluids and chemicals in the body. A small amount of kidney damage may not cause problems. You must take steps to help keep the kidney damage from getting worse. A lot of damage may cause kidney failure. Kidney failure means the kidneys can no longer work right. What are the causes? Diabetes. High blood pressure. Diseases that affect the heart and blood vessels. Other kidney diseases. Diseases that affect the body's defense system (immune system). A problem with the flow of pee. This may be caused by: Kidney stones. Cancer. An enlarged prostate, in males. A kidney infection or urinary tract infection (UTI) that keeps coming back. What increases the risk? Getting older. The chances of having CKD increase with age. A family history of kidney disease or kidney failure. Having a disease caused by genes. Taking medicines that can harm the kidneys. Being near or having contact with harmful substances. Being very overweight. Using tobacco now or in the past. What are the signs or symptoms? Common symptoms of CKD include: Feeling very tired and having less energy. Swelling of the face, legs, ankles, or feet. Throwing up or feeling like you may throw up. Not wanting to eat as much as normal. Being confused or not able to focus. Twitches and cramps in the leg muscles or other muscles. Dry, itchy skin. Other symptoms may include: Shortness of breath. Trouble sleeping. Making less pee, or making more pee, especially at night. A taste of metal in your mouth. You may also become anemic. Anemia means there's not enough red blood cells in your blood. You may get  symptoms slowly. You may not notice them until the kidney damage gets very bad. How is this diagnosed? CKD may be diagnosed based on: Tests on your blood or pee. Imaging tests, like an ultrasound or a CT scan. A kidney biopsy. For this test, a sample of kidney tissue is removed to be looked at under a microscope. These tests will help to find out how serious the CKD is. How is this treated? Often, there's no cure for CKD. Treatment can help with symptoms and help keep the disease from getting worse. Treatment may include: Treating other problems that are causing your CKD or making it worse. Diet changes. You may need to: Avoid alcohol. Avoid foods that are high in salt, potassium, phosphorous, and protein. Taking medicines for symptoms and to help control other conditions. Dialysis. This treatment gets harmful waste out of your body. It may be needed if you have kidney failure. Follow these instructions at home: Medicines Take your medicines only as told. The amount of some medicines you take may need to be changed. Do not take any new medicines, vitamins, or supplements unless your health care provider says it's okay. These may make kidney damage worse. Lifestyle Do not smoke, vape, or use nicotine or tobacco. If you drink alcohol: Limit how much you have to: 0-1 drink a day if you're male. 0-2 drinks a day if you're male. Know how much alcohol is in your drink. In the [place] one drink is one 12 oz bottle of beer (355 mL), one 5 oz glass of wine (148 mL), or one 1 oz  glass of hard liquor (44 mL). Stay at a healthy weight. If you need help, ask your provider. General instructions  Eat and drink as told. Track your blood pressure at home. Tell your provider about any changes. If you have diabetes, track your blood sugar as told. Exercise at least 30 minutes a day, 5 days a week. Keep your shots (vaccinations) up to date. Keep all follow-up visits. Your provider may need to change  your treatments over time. Where to find support American Kidney Fund: [place].com.au Kidney School: kidneyschool.org American Association of Kidney Patients: https://www.miller-montoya.com/ Where to find more information National Kidney Foundation: kidney.org Centers for Disease Control and Prevention. To learn more: Go to DiningCalendar.de. Click "Search". Type "chronic kidney disease" in the search box. Contact a health care provider if: You have new symptoms. You get symptoms of end-stage kidney disease. These include: Headaches. Numbness in your hands or feet. Leg cramps. Easy bruising. Get help right away if: You have a fever. You make less pee than usual. You have pain or bleeding when you pee or poop. You have chest pain. You have shortness of breath. These symptoms may be an emergency. Call 911 right away. Do not wait to see if the symptoms will go away. Do not drive yourself to the hospital. This information is not intended to replace advice given to you by your health care provider. Make sure you discuss any questions you have with your health care provider. Document Revised: 03/14/2023 Document Reviewed: 11/04/2022 Elsevier Patient Education  2024 [employer].

## 2023-12-01 ENCOUNTER — Other Ambulatory Visit: Payer: Self-pay | Admitting: Medical Genetics

## 2023-12-04 ENCOUNTER — Other Ambulatory Visit (HOSPITAL_COMMUNITY)
Admission: RE | Admit: 2023-12-04 | Discharge: 2023-12-04 | Disposition: A | Discharge status: Home / Self Care | Payer: Self-pay | Source: Ambulatory Visit | Attending: Medical Genetics | Admitting: Medical Genetics

## 2023-12-05 DIAGNOSIS — L308 Other specified dermatitis: Secondary | ICD-10-CM | POA: Diagnosis not present

## 2023-12-05 DIAGNOSIS — I8311 Varicose veins of right lower extremity with inflammation: Secondary | ICD-10-CM | POA: Diagnosis not present

## 2023-12-05 DIAGNOSIS — L570 Actinic keratosis: Secondary | ICD-10-CM | POA: Diagnosis not present

## 2023-12-05 DIAGNOSIS — I872 Venous insufficiency (chronic) (peripheral): Secondary | ICD-10-CM | POA: Diagnosis not present

## 2023-12-05 DIAGNOSIS — I8312 Varicose veins of left lower extremity with inflammation: Secondary | ICD-10-CM | POA: Diagnosis not present

## 2023-12-05 DIAGNOSIS — Z85828 Personal history of other malignant neoplasm of skin: Secondary | ICD-10-CM | POA: Diagnosis not present

## 2023-12-05 MED ORDER — OZEMPIC (0.25 OR 0.5 MG/DOSE) 2 MG/3ML ~~LOC~~ SOPN: 0.2500 mg | PEN_INJECTOR | SUBCUTANEOUS | 0 refills | Status: DC

## 2023-12-05 MED ORDER — INSULIN PEN NEEDLE 32G X 6 MM MISC: 1.0000 | 0 refills | Status: DC

## 2023-12-13 ENCOUNTER — Telehealth: Payer: Self-pay | Admitting: *Deleted

## 2023-12-13 NOTE — Progress Notes (Signed)
 Care Guide Pharmacy Note  12/13/2023 Name: Louis Kent MRN: [id number] DOB: 11/08/1940  Referred By: Joshua Debby CROME, MD Reason for referral: Complex Care Management (Outreach to schedule referral with pharmacist )   Louis Kent is a 83 y.o. year old male who is a primary care patient of Joshua Debby CROME, MD.  Louis Kent was referred to the pharmacist for assistance related to: DMII  Successful contact was made with the patient to discuss pharmacy services including being ready for the pharmacist to call at least 5 minutes before the scheduled appointment time and to have medication bottles and any blood pressure readings ready for review. The patient agreed to meet with the pharmacist via telephone visit on 12/26/2023  [name], CMA [place]  [facility], [place] [facility] Guide Direct Dial: [phone]  Fax: [phone] Website: [email or web address]

## 2023-12-15 LAB — GENECONNECT MOLECULAR SCREEN: Genetic Analysis Overall Interpretation: NEGATIVE

## 2023-12-21 ENCOUNTER — Other Ambulatory Visit: Payer: Self-pay | Admitting: Internal Medicine

## 2023-12-21 DIAGNOSIS — I6523 Occlusion and stenosis of bilateral carotid arteries: Secondary | ICD-10-CM

## 2023-12-21 DIAGNOSIS — I10 Essential (primary) hypertension: Secondary | ICD-10-CM

## 2023-12-26 ENCOUNTER — Other Ambulatory Visit

## 2023-12-27 DIAGNOSIS — H6123 Impacted cerumen, bilateral: Secondary | ICD-10-CM | POA: Diagnosis not present

## 2023-12-28 DIAGNOSIS — H33302 Unspecified retinal break, left eye: Secondary | ICD-10-CM | POA: Diagnosis not present

## 2023-12-28 DIAGNOSIS — H25013 Cortical age-related cataract, bilateral: Secondary | ICD-10-CM | POA: Diagnosis not present

## 2023-12-28 DIAGNOSIS — H43812 Vitreous degeneration, left eye: Secondary | ICD-10-CM | POA: Diagnosis not present

## 2023-12-28 DIAGNOSIS — H2513 Age-related nuclear cataract, bilateral: Secondary | ICD-10-CM | POA: Diagnosis not present

## 2024-01-06 ENCOUNTER — Other Ambulatory Visit: Payer: Self-pay | Admitting: Internal Medicine

## 2024-01-06 DIAGNOSIS — E118 Type 2 diabetes mellitus with unspecified complications: Secondary | ICD-10-CM

## 2024-01-08 ENCOUNTER — Other Ambulatory Visit: Payer: Self-pay | Admitting: Internal Medicine

## 2024-01-08 DIAGNOSIS — E118 Type 2 diabetes mellitus with unspecified complications: Secondary | ICD-10-CM

## 2024-01-08 MED ORDER — SEMAGLUTIDE (1 MG/DOSE) 4 MG/3ML ~~LOC~~ SOPN: 1.0000 mg | PEN_INJECTOR | SUBCUTANEOUS | 0 refills | Status: DC

## 2024-01-09 ENCOUNTER — Other Ambulatory Visit: Payer: Self-pay | Admitting: Sports Medicine

## 2024-01-09 DIAGNOSIS — M1A09X0 Idiopathic chronic gout, multiple sites, without tophus (tophi): Secondary | ICD-10-CM

## 2024-01-12 ENCOUNTER — Other Ambulatory Visit: Admitting: Pharmacist

## 2024-01-12 DIAGNOSIS — E118 Type 2 diabetes mellitus with unspecified complications: Secondary | ICD-10-CM

## 2024-01-12 DIAGNOSIS — I10 Essential (primary) hypertension: Secondary | ICD-10-CM

## 2024-01-12 DIAGNOSIS — Z7984 Long term (current) use of oral hypoglycemic drugs: Secondary | ICD-10-CM

## 2024-01-12 NOTE — Patient Instructions (Signed)
 It was a pleasure speaking with you today!  Continue focusing on diet changes to lower blood sugars. We will recheck your A1c on 03/01/2024, then determine the best plan moving forward based on the result.  Feel free to call with any questions or concerns!  Darrelyn Drum, PharmD, BCPS, CPP Clinical Pharmacist Practitioner [facility] at [facility] [phone]

## 2024-01-12 NOTE — Progress Notes (Signed)
 01/12/2024 Name: Louis Kent MRN: [id number] DOB: 1940-11-21  Chief Complaint  Patient presents with   Diabetes   Hypertension   Medication Management    Louis Kent is a 83 y.o. year old male who presented for a telephone visit.   They were referred to the pharmacist by their PCP for assistance in managing diabetes and hypertension.    Subjective:  Care Team: Primary Care Provider: Joshua Debby CROME, MD ; Next Scheduled Visit: 05/01/2024  Medication Access/Adherence  Current Pharmacy:  [id number] - [place], [place] - [address] AT [facility] [address] [place] [address] Phone: [phone] Fax: [phone]  [id number] [place], [place] - [address] AT [facility] [address] [place] [address] Phone: [phone] Fax: [phone]  [id number] - [place], [place] - [address] AT CORNER OF [facility] [address] [place]  [address] Phone: [phone] Fax: [phone]   Patient reports affordability concerns with their medications: No  Patient reports access/transportation concerns to their pharmacy: No  Patient reports adherence concerns with their medications:  No     Diabetes:  Current medications: Farxiga  10 mg daily (also for CKD) Medications tried in the past: none *Pt was prescribed Ozempic  0.25 mg 11/30/23, however he has not started due to receiving no counseling on the medication. He notes he did purchase it and has had it stored in the refrigerator.  Current meal patterns: Pt notes he was often having ice cream or cookies, which he has cut back on. Has also been watching portion sizes of carbohydrates/starchy foods. Does report drinking orange juice daily (about 1 cup w/ breakfast)   Hypertension:  Current medications: irbesartan  300 mg daily, torsemide  10 mg daily Medications previously  tried: hydrochlorothiazide   Patient has a validated, automated, upper arm home BP cuff Current blood pressure readings readings: Typically 128-135/70s  Objective:  Lab Results  Component Value Date   HGBA1C 7.4 (H) 11/30/2023    Lab Results  Component Value Date   CREATININE 1.16 11/30/2023   BUN 26 (H) 11/30/2023   NA 139 11/30/2023   K 4.2 11/30/2023   CL 100 11/30/2023   CO2 28 11/30/2023    Lab Results  Component Value Date   CHOL 157 11/30/2023   HDL 65.20 11/30/2023   LDLCALC 54 11/30/2023   LDLDIRECT 47.0 03/10/2021   TRIG 189.0 (H) 11/30/2023   CHOLHDL 2 11/30/2023    Medications Reviewed Today     Reviewed by Louis Kent, RPH (Pharmacist) on 01/12/24 at 1651  Med List Status: <None>   Medication Order Taking? Sig Documenting Provider Last Dose Status Informant  allopurinol  (ZYLOPRIM ) 100 MG tablet [id number] Yes TAKE 1 TABLET BY MOUTH EVERY DAY Louis Katz, DO  Active   atorvastatin  (LIPITOR) 10 MG tablet [id number] Yes Take 1 tablet (10 mg total) by mouth daily. Louis Debby CROME, MD  Active   Cholecalciferol  (VITAMIN D3) 50 MCG (2000 UT) capsule [id number]  TAKE 1 CAPSULE BY MOUTH EVERY DAY Louis Debby CROME, MD  Active   dapagliflozin  propanediol (FARXIGA ) 10 MG TABS tablet [id number] Yes Take 1 tablet (10 mg total) by mouth daily before breakfast. Louis Debby CROME, MD  Active   Insulin  Pen Needle 32G X 6 MM MISC [id number]  1 Act by Does not apply route once a week. Louis Debby CROME, MD  Active   irbesartan  (AVAPRO ) 300 MG  tablet [id number] Yes TAKE 1 TABLET BY MOUTH EVERY DAY Louis Debby CROME, MD  Active   Multiple Vitamins-Minerals (MULTIVITAMIN WITH MINERALS) tablet [id number]  Take by mouth. [provider]  Active   Omega-3 1400 MG CAPS [id number]  Take by mouth. [provider]  Active    Patient not taking:   Discontinued 01/12/24 1651 (Entry Error)   Semaglutide ,0.25 or 0.5MG /DOS, (OZEMPIC , 0.25 OR 0.5 MG/DOSE,) 2 MG/3ML SOPN  [id number]  Inject 0.25 mg into the skin once a week.  Patient not taking: Reported on 01/12/2024   [provider]  Active   torsemide  (DEMADEX ) 10 MG tablet [id number] Yes TAKE 1 TABLET BY MOUTH EVERY DAY Louis Debby CROME, MD  Active             Assessment/Plan:   Diabetes: - Currently uncontrolled, Goal A1c <7% - Reviewed long term cardiovascular and renal outcomes of uncontrolled blood sugar - Reviewed dietary modifications including carbs in moderation, balanced meals/snacks (increased protein/fiber), reduce or eliminate fruit juice - Discussed side effects and risks associated with Ozempic  - Shared decision making was used to decide to continue focusing on diet changes for now prior to checking A1c on 03/01/2024.  - Will hold off on starting Ozempic . If A1c remains elevated, will discuss medication options in more detail.   Hypertension: - Currently controlled, BP goal <130/80 - Reviewed long term cardiovascular and renal outcomes of uncontrolled blood pressure - Recommend to continue current regimen    Follow Up Plan: 10/17 walk-in A1c  Louis Kent, PharmD, BCPS, CPP Clinical Pharmacist Practitioner [facility] [phone]

## 2024-01-26 ENCOUNTER — Other Ambulatory Visit: Payer: Self-pay | Admitting: Internal Medicine

## 2024-01-26 DIAGNOSIS — I10 Essential (primary) hypertension: Secondary | ICD-10-CM

## 2024-01-26 DIAGNOSIS — R600 Localized edema: Secondary | ICD-10-CM

## 2024-02-13 DIAGNOSIS — Z23 Encounter for immunization: Secondary | ICD-10-CM | POA: Diagnosis not present

## 2024-02-25 ENCOUNTER — Other Ambulatory Visit: Payer: Self-pay | Admitting: Internal Medicine

## 2024-02-25 DIAGNOSIS — I6523 Occlusion and stenosis of bilateral carotid arteries: Secondary | ICD-10-CM

## 2024-02-25 DIAGNOSIS — I10 Essential (primary) hypertension: Secondary | ICD-10-CM

## 2024-02-29 ENCOUNTER — Other Ambulatory Visit

## 2024-02-29 DIAGNOSIS — E118 Type 2 diabetes mellitus with unspecified complications: Secondary | ICD-10-CM | POA: Diagnosis not present

## 2024-02-29 LAB — HEMOGLOBIN A1C
Hemoglobin A1C: 7.3
Hgb A1c MFr Bld: 7.3 % — ABNORMAL HIGH (ref 4.6–6.5)

## 2024-03-01 ENCOUNTER — Ambulatory Visit: Payer: Self-pay | Admitting: Internal Medicine

## 2024-03-14 ENCOUNTER — Other Ambulatory Visit: Payer: Self-pay | Admitting: Internal Medicine

## 2024-03-14 DIAGNOSIS — I6523 Occlusion and stenosis of bilateral carotid arteries: Secondary | ICD-10-CM

## 2024-03-14 DIAGNOSIS — E785 Hyperlipidemia, unspecified: Secondary | ICD-10-CM

## 2024-03-28 DIAGNOSIS — H6123 Impacted cerumen, bilateral: Secondary | ICD-10-CM | POA: Diagnosis not present

## 2024-03-28 DIAGNOSIS — Z974 Presence of external hearing-aid: Secondary | ICD-10-CM | POA: Diagnosis not present

## 2024-04-02 DIAGNOSIS — Z85828 Personal history of other malignant neoplasm of skin: Secondary | ICD-10-CM | POA: Diagnosis not present

## 2024-04-02 DIAGNOSIS — L570 Actinic keratosis: Secondary | ICD-10-CM | POA: Diagnosis not present

## 2024-04-02 DIAGNOSIS — L821 Other seborrheic keratosis: Secondary | ICD-10-CM | POA: Diagnosis not present

## 2024-04-03 ENCOUNTER — Ambulatory Visit

## 2024-04-03 VITALS — BP 138/80 | HR 83 | Ht 70.5 in | Wt 213.2 lb

## 2024-04-03 DIAGNOSIS — Z0000 Encounter for general adult medical examination without abnormal findings: Secondary | ICD-10-CM | POA: Diagnosis not present

## 2024-04-03 NOTE — Progress Notes (Signed)
 Chief Complaint  Patient presents with   Medicare Wellness     Subjective:   Louis Kent is a 83 y.o. male who presents for a Medicare Annual Wellness Visit.  Allergies (verified) Patient has no known allergies.   History: Past Medical History:  Diagnosis Date   Hyperlipidemia    Hypertension    S/P prostatectomy    Past Surgical History:  Procedure Laterality Date   ANKLE ARTHROSCOPY W/ OPEN REPAIR     bilater hip replacements     PROSTATE SURGERY     TONSILLECTOMY     Family History  Problem Relation Age of Onset   Stroke Mother    Diabetes Mother    Lung cancer Father        lung cancer   Lung cancer Other    Social History   Occupational History   Occupation: retired    Associate Professor: RETIRED  Tobacco Use   Smoking status: Never   Smokeless tobacco: Never  Vaping Use   Vaping status: Never Used  Substance and Sexual Activity   Alcohol use: No   Drug use: No   Sexual activity: Not Currently   Tobacco Counseling Counseling given: Not Answered  SDOH Screenings   Food Insecurity: No Food Insecurity (04/03/2024)  Housing: Low Risk  (04/03/2024)  Transportation Needs: No Transportation Needs (04/03/2024)  Utilities: Not At Risk (04/03/2024)  Alcohol Screen: Low Risk  (02/10/2023)  Depression (PHQ2-9): Low Risk  (04/03/2024)  Financial Resource Strain: Low Risk  (04/01/2024)  Physical Activity: Insufficiently Active (04/03/2024)  Social Connections: Moderately Isolated (04/03/2024)  Stress: No Stress Concern Present (04/03/2024)  Tobacco Use: Low Risk  (04/03/2024)  Health Literacy: Adequate Health Literacy (04/03/2024)   See flowsheets for full screening details  Depression Screen PHQ 2 & 9 Depression Scale- Over the past 2 weeks, how often have you been bothered by any of the following problems? Little interest or pleasure in doing things: 0 Feeling down, depressed, or hopeless (PHQ Adolescent also includes...irritable): 0 PHQ-2 Total Score:  0 Trouble falling or staying asleep, or sleeping too much: 0 Feeling tired or having little energy: 0 Poor appetite or overeating (PHQ Adolescent also includes...weight loss): 0 Feeling bad about yourself - or that you are a failure or have let yourself or your family down: 0 Trouble concentrating on things, such as reading the newspaper or watching television (PHQ Adolescent also includes...like school work): 0 Moving or speaking so slowly that other people could have noticed. Or the opposite - being so fidgety or restless that you have been moving around a lot more than usual: 0 Thoughts that you would be better off dead, or of hurting yourself in some way: 0 PHQ-9 Total Score: 0 If you checked off any problems, how difficult have these problems made it for you to do your work, take care of things at home, or get along with other people?: Not difficult at all  Depression Treatment Depression Interventions/Treatment : EYV7-0 Score <4 Follow-up Not Indicated     Goals Addressed               This Visit's Progress     Patient Stated (pt-stated)        Patient stated he plans to continue to stay active and lose weight.       Visit info / Clinical Intake: Medicare Wellness Visit Type:: Subsequent Annual Wellness Visit Persons participating in visit:: patient Medicare Wellness Visit Mode:: In-person (required for WTM) Information given by:: patient  Interpreter Needed?: No Pre-visit prep was completed: yes AWV questionnaire completed by patient prior to visit?: yes Date:: 04/01/24 Living arrangements:: (!) lives alone Patient's Overall Health Status Rating: good Typical amount of pain: none Does pain affect daily life?: no Are you currently prescribed opioids?: no  Dietary Habits and Nutritional Risks How many meals a day?: 3 Eats fruit and vegetables daily?: yes Most meals are obtained by: preparing own meals; eating out In the last 2 weeks, have you had any of the  following?: none Diabetic:: (!) yes Any non-healing wounds?: no How often do you check your BS?: 0 Would you like to be referred to a Nutritionist or for Diabetic Management? : no  Functional Status Activities of Daily Living (to include ambulation/medication): Independent Ambulation: Independent with device- listed below Home Assistive Devices/Equipment: Eyeglasses (wears hearing aids) Medication Administration: Independent Home Management: Independent Manage your own finances?: yes Primary transportation is: driving Concerns about vision?: no *vision screening is required for WTM* Concerns about hearing?: (!) yes Uses hearing aids?: (!) yes Hear whispered voice?: (!) no *in-person visit only*  Fall Screening Falls in the past year?: 0 Number of falls in past year: 0 Was there an injury with Fall?: 0 Fall Risk Category Calculator: 0 Patient Fall Risk Level: Low Fall Risk  Fall Risk Patient at Risk for Falls Due to: No Fall Risks Fall risk Follow up: Falls evaluation completed; Falls prevention discussed  Home and Transportation Safety: All rugs have non-skid backing?: yes All stairs or steps have railings?: yes Grab bars in the bathtub or shower?: yes Have non-skid surface in bathtub or shower?: yes Good home lighting?: yes Regular seat belt use?: yes Hospital stays in the last year:: no  Cognitive Assessment Difficulty concentrating, remembering, or making decisions? : no Will 6CIT or Mini Cog be Completed: yes What year is it?: 0 points What month is it?: 0 points Give patient an address phrase to remember (5 components): [address] [place] About what time is it?: 0 points Count backwards from 20 to 1: 0 points Say the months of the year in reverse: 0 points Repeat the address phrase from earlier: 0 points 6 CIT Score: 0 points  Advance Directives (For Healthcare) Does Patient Have a Medical Advance Directive?: Yes Does patient want to make changes to  medical advance directive?: Yes (Inpatient - patient requests chaplain consult to change a medical advance directive) Type of Advance Directive: Healthcare Power of [place]; Living will Copy of Healthcare Power of Attorney in Chart?: No - copy requested Copy of Living Will in Chart?: No - copy requested  Reviewed/Updated  Reviewed/Updated: Reviewed All (Medical, Surgical, Family, Medications, Allergies, Care Teams, Patient Goals)        Objective:    Today's Vitals   04/03/24 1235  BP: 138/80  Pulse: 83  SpO2: 98%  Weight: 213 lb 3.2 oz (96.7 kg)  Height: 5' 10.5 (1.791 m)   Body mass index is 30.16 kg/m.  Current Medications (verified) Outpatient Encounter Medications as of 04/03/2024  Medication Sig   allopurinol  (ZYLOPRIM ) 100 MG tablet TAKE 1 TABLET BY MOUTH EVERY DAY   atorvastatin  (LIPITOR) 10 MG tablet TAKE 1 TABLET BY MOUTH EVERY DAY   Cholecalciferol  (VITAMIN D3) 50 MCG (2000 UT) capsule TAKE 1 CAPSULE BY MOUTH EVERY DAY   dapagliflozin  propanediol (FARXIGA ) 10 MG TABS tablet Take 1 tablet (10 mg total) by mouth daily before breakfast.   Insulin  Pen Needle 32G X 6 MM MISC 1 Act by  Does not apply route once a week.   irbesartan  (AVAPRO ) 300 MG tablet TAKE 1 TABLET BY MOUTH EVERY DAY   Multiple Vitamins-Minerals (MULTIVITAMIN WITH MINERALS) tablet Take by mouth.   Omega-3 1400 MG CAPS Take by mouth.   torsemide  (DEMADEX ) 10 MG tablet TAKE 1 TABLET BY MOUTH EVERY DAY   No facility-administered encounter medications on file as of 04/03/2024.   Hearing/Vision screen Hearing Screening - Comments:: Wears hearing aids Vision Screening - Comments:: Wears rx glasses - up to date with routine eye exams with Ozell Bertin Immunizations and Health Maintenance Health Maintenance  Topic Date Due   Zoster Vaccines- Shingrix (2 of 2) 05/10/2019   DTaP/Tdap/Td (3 - Td or Tdap) 09/14/2022   COVID-19 Vaccine (6 - 2025-26 season) 01/15/2024   FOOT EXAM  05/22/2024    OPHTHALMOLOGY EXAM  05/29/2024   HEMOGLOBIN A1C  08/29/2024   Diabetic kidney evaluation - eGFR measurement  11/29/2024   Diabetic kidney evaluation - Urine ACR  11/29/2024   Medicare Annual Wellness (AWV)  04/03/2025   Pneumococcal Vaccine: 50+ Years  Completed   Influenza Vaccine  Completed   Meningococcal B Vaccine  Aged Out        Assessment/Plan:  This is a routine wellness examination for Louis Kent.  Patient Care Team: Joshua Debby CROME, MD as PCP - General (Internal Medicine) Merceda Lela SAUNDERS, [facility] as Pharmacist (Pharmacist) Bertin Ozell, MD as Consulting Physician (Ophthalmology)  I have personally reviewed and noted the following in the patient's chart:   Medical and social history Use of alcohol, tobacco or illicit drugs  Current medications and supplements including opioid prescriptions. Functional ability and status Nutritional status Physical activity Advanced directives List of other physicians Hospitalizations, surgeries, and ER visits in previous 12 months Vitals Screenings to include cognitive, depression, and falls Referrals and appointments  No orders of the defined types were placed in this encounter.  In addition, I have reviewed and discussed with patient certain preventive protocols, quality metrics, and best practice recommendations. A written personalized care plan for preventive services as well as general preventive health recommendations were provided to patient.   Louis Kent, CMA   04/03/2024   Return in 1 year (on 04/03/2025).  After Visit Summary: (In Person-Declined) Patient declined AVS at this time.  Nurse Notes: Scheduled 2026 AWV/CPE appts.

## 2024-04-03 NOTE — Patient Instructions (Addendum)
 Louis Kent,  Thank you for taking the time for your Medicare Wellness Visit. I appreciate your continued commitment to your health goals. Please review the care plan we discussed, and feel free to reach out if I can assist you further.  Please note that Annual Wellness Visits do not include a physical exam. Some assessments may be limited, especially if the visit was conducted virtually. If needed, we may recommend an in-person follow-up with your provider.  Ongoing Care Seeing your primary care provider every 3 to 6 months helps us  monitor your health and provide consistent, personalized care.   Referrals If a referral was made during today's visit and you haven't received any updates within two weeks, please contact the referred provider directly to check on the status.  Recommended Screenings:  Health Maintenance  Topic Date Due   Zoster (Shingles) Vaccine (2 of 2) 05/10/2019   DTaP/Tdap/Td vaccine (3 - Td or Tdap) 09/14/2022   COVID-19 Vaccine (6 - 2025-26 season) 01/15/2024   Complete foot exam   05/22/2024   Eye exam for diabetics  05/29/2024   Hemoglobin A1C  08/29/2024   Yearly kidney function blood test for diabetes  11/29/2024   Yearly kidney health urinalysis for diabetes  11/29/2024   Medicare Annual Wellness Visit  04/03/2025   Pneumococcal Vaccine for age over 22  Completed   Flu Shot  Completed   Meningitis B Vaccine  Aged Out       04/03/2024   12:28 PM  Advanced Directives  Does Patient Have a Medical Advance Directive? Yes  Type of Estate Agent of [place];Living will  Does patient want to make changes to medical advance directive? Yes (Inpatient - patient requests chaplain consult to change a medical advance directive)  Copy of Healthcare Power of Attorney in Chart? No - copy requested    Vision: Annual vision screenings are recommended for early detection of glaucoma, cataracts, and diabetic retinopathy. These exams can also reveal  signs of chronic conditions such as diabetes and high blood pressure.  Dental: Annual dental screenings help detect early signs of oral cancer, gum disease, and other conditions linked to overall health, including heart disease and diabetes.

## 2024-05-01 ENCOUNTER — Ambulatory Visit: Admitting: Internal Medicine

## 2024-05-01 ENCOUNTER — Ambulatory Visit: Payer: Self-pay | Admitting: Internal Medicine

## 2024-05-01 ENCOUNTER — Encounter: Payer: Self-pay | Admitting: Internal Medicine

## 2024-05-01 VITALS — BP 128/78 | HR 90 | Temp 98.1°F | Resp 16 | Ht 70.5 in | Wt 205.8 lb

## 2024-05-01 DIAGNOSIS — Z7984 Long term (current) use of oral hypoglycemic drugs: Secondary | ICD-10-CM | POA: Diagnosis not present

## 2024-05-01 DIAGNOSIS — Z23 Encounter for immunization: Secondary | ICD-10-CM

## 2024-05-01 DIAGNOSIS — E1122 Type 2 diabetes mellitus with diabetic chronic kidney disease: Secondary | ICD-10-CM

## 2024-05-01 DIAGNOSIS — N1831 Chronic kidney disease, stage 3a: Secondary | ICD-10-CM | POA: Diagnosis not present

## 2024-05-01 DIAGNOSIS — E785 Hyperlipidemia, unspecified: Secondary | ICD-10-CM

## 2024-05-01 DIAGNOSIS — E538 Deficiency of other specified B group vitamins: Secondary | ICD-10-CM | POA: Diagnosis not present

## 2024-05-01 DIAGNOSIS — I10 Essential (primary) hypertension: Secondary | ICD-10-CM

## 2024-05-01 LAB — URINALYSIS, ROUTINE W REFLEX MICROSCOPIC
Bilirubin Urine: NEGATIVE
Hgb urine dipstick: NEGATIVE
Ketones, ur: NEGATIVE
Leukocytes,Ua: NEGATIVE
Nitrite: NEGATIVE
RBC / HPF: NONE SEEN (ref 0–?)
Specific Gravity, Urine: <=1.005 — AB (ref 1.000–1.030)
Total Protein, Urine: NEGATIVE
Urine Glucose: >=1000 — AB
Urobilinogen, UA: 0.2 (ref 0.0–1.0)
pH: 6 (ref 5.0–8.0)

## 2024-05-01 LAB — HEPATIC FUNCTION PANEL
ALT: 33 U/L (ref 3–53)
AST: 30 U/L (ref 5–37)
Albumin: 4.3 g/dL (ref 3.5–5.2)
Alkaline Phosphatase: 142 U/L — ABNORMAL HIGH (ref 39–117)
Bilirubin, Direct: 0.3 mg/dL (ref 0.1–0.3)
Total Bilirubin: 1 mg/dL (ref 0.2–1.2)
Total Protein: 7.2 g/dL (ref 6.0–8.3)

## 2024-05-01 LAB — CBC WITH DIFFERENTIAL/PLATELET
Basophils Absolute: 0 K/uL (ref 0.0–0.1)
Basophils Relative: 0.5 % (ref 0.0–3.0)
Eosinophils Absolute: 0.1 K/uL (ref 0.0–0.7)
Eosinophils Relative: 1.2 % (ref 0.0–5.0)
HCT: 44.7 % (ref 39.0–52.0)
Hemoglobin: 15.1 g/dL (ref 13.0–17.0)
Lymphocytes Relative: 13.2 % (ref 12.0–46.0)
Lymphs Abs: 1.2 K/uL (ref 0.7–4.0)
MCHC: 33.7 g/dL (ref 30.0–36.0)
MCV: 93.4 fl (ref 78.0–100.0)
Monocytes Absolute: 0.8 K/uL (ref 0.1–1.0)
Monocytes Relative: 8.4 % (ref 3.0–12.0)
Neutro Abs: 7.1 K/uL (ref 1.4–7.7)
Neutrophils Relative %: 76.7 % (ref 43.0–77.0)
Platelets: 207 K/uL (ref 150.0–400.0)
RBC: 4.79 Mil/uL (ref 4.22–5.81)
RDW: 13.7 % (ref 11.5–15.5)
WBC: 9.3 K/uL (ref 4.0–10.5)

## 2024-05-01 LAB — BASIC METABOLIC PANEL WITH GFR
BUN: 21 mg/dL (ref 6–23)
CO2: 26 meq/L (ref 19–32)
Calcium: 9.3 mg/dL (ref 8.4–10.5)
Chloride: 101 meq/L (ref 96–112)
Creatinine, Ser: 1.21 mg/dL (ref 0.40–1.50)
GFR: 54.54 mL/min — ABNORMAL LOW (ref >60.00)
Glucose, Bld: 238 mg/dL — ABNORMAL HIGH (ref 70–99)
Potassium: 3.9 meq/L (ref 3.5–5.1)
Sodium: 139 meq/L (ref 135–145)

## 2024-05-01 LAB — LIPID PANEL
Cholesterol: 116 mg/dL (ref 28–200)
HDL: 63.2 mg/dL (ref >39.00)
LDL Cholesterol: 23 mg/dL (ref 10–99)
NonHDL: 52.72
Total CHOL/HDL Ratio: 2
Triglycerides: 149 mg/dL (ref 10.0–149.0)
VLDL: 29.8 mg/dL (ref 0.0–40.0)

## 2024-05-01 LAB — TSH: TSH: 2.27 u[IU]/mL (ref 0.35–5.50)

## 2024-05-01 LAB — FOLATE: Folate: >23.7 ng/mL (ref >5.9)

## 2024-05-01 LAB — VITAMIN B12: Vitamin B-12: 521 pg/mL (ref 211–911)

## 2024-05-01 MED ORDER — SHINGRIX 50 MCG/0.5ML IM SUSR: 0.5000 mL | Freq: Once | INTRAMUSCULAR | 0 refills | Status: AC

## 2024-05-01 MED ORDER — COVID-19 MRNA VAC-TRIS(PFIZER) 30 MCG/0.3ML IM SUSY: 0.3000 mL | PREFILLED_SYRINGE | Freq: Once | INTRAMUSCULAR | 0 refills | Status: AC

## 2024-05-01 NOTE — Progress Notes (Unsigned)
 Subjective:  Patient ID: Louis Kent, male    DOB: 14-Oct-1940  Age: 83 y.o. MRN: [id number]  CC: Hyperlipidemia, Hypertension, and Diabetes   HPI Louis Kent presents for f/up ---  Discussed the use of AI scribe software for clinical note transcription with the patient, who gave verbal consent to proceed.  History of Present Illness Louis Kent is an 83 year old male with diabetes who presents with loose stools and increased gas.  He has been experiencing loose stools and increased gas over the last several weeks. The stools are not formed, and he had a day of significant diarrhea about three to four days ago, accompanied by urgency. No cramping, blood in the stool, nausea, or vomiting. He has not taken Imodium, expecting the symptoms to resolve on his own.  He has a history of gallstones, with a large stone detected some time ago. No abdominal pain or right upper quadrant pain. He questions whether the gallstone could impact food absorption and contribute to his weight loss.  He has intentionally lost over 30 pounds in the last year and a half, reducing his weight from 234 pounds to 200 pounds. He attributes this to dietary changes and increased physical activity, avoiding sweets, and reducing intake of orange juice and bananas to manage his diabetes.  He has diabetes with a recent A1c of 7.3%, managed through diet and weight loss. He is not currently taking Ozempic  and is exploring alternatives. He takes a multivitamin and vitamin D  but does not take a B12 supplement.  No weakness, dizziness, or lightheadedness. He mentions frequent urination, particularly in the morning, attributed to diuretic use. His daughter has Crohn's disease.     Outpatient Medications Prior to Visit  Medication Sig Dispense Refill   allopurinol  (ZYLOPRIM ) 100 MG tablet TAKE 1 TABLET BY MOUTH EVERY DAY 90 tablet 1   atorvastatin  (LIPITOR) 10 MG tablet TAKE 1 TABLET BY MOUTH EVERY DAY 90 tablet 1    Cholecalciferol  (VITAMIN D3) 50 MCG (2000 UT) capsule TAKE 1 CAPSULE BY MOUTH EVERY DAY 90 capsule 1   Insulin  Pen Needle 32G X 6 MM MISC 1 Act by Does not apply route once a week. 100 each 0   irbesartan  (AVAPRO ) 300 MG tablet TAKE 1 TABLET BY MOUTH EVERY DAY 90 tablet 0   Multiple Vitamins-Minerals (MULTIVITAMIN WITH MINERALS) tablet Take by mouth.     Omega-3 1400 MG CAPS Take by mouth.     torsemide  (DEMADEX ) 10 MG tablet TAKE 1 TABLET BY MOUTH EVERY DAY 90 tablet 1   dapagliflozin  propanediol (FARXIGA ) 10 MG TABS tablet Take 1 tablet (10 mg total) by mouth daily before breakfast. 90 tablet 1   No facility-administered medications prior to visit.    ROS Review of Systems  Constitutional:  Negative for appetite change, chills, diaphoresis, fatigue and fever.  HENT: Negative.    Respiratory: Negative.  Negative for cough, chest tightness, shortness of breath and wheezing.   Cardiovascular:  Positive for leg swelling. Negative for chest pain and palpitations.  Gastrointestinal:  Positive for diarrhea. Negative for abdominal pain, blood in stool, constipation, nausea, rectal pain and vomiting.  Endocrine: Negative.   Genitourinary: Negative.  Negative for difficulty urinating.  Musculoskeletal: Negative.  Negative for arthralgias and myalgias.  Skin: Negative.  Negative for color change.  Neurological: Negative.  Negative for dizziness, weakness and light-headedness.  Hematological:  Negative for adenopathy. Does not bruise/bleed easily.  Psychiatric/Behavioral: Negative.      Objective:  BP 128/78 (BP Location: Left Arm, Patient Position: Sitting, Cuff Size: Normal)   Pulse 90   Temp 98.1 F (36.7 C) (Oral)   Resp 16   Ht 5' 10.5 (1.791 m)   Wt 205 lb 12.8 oz (93.4 kg)   SpO2 99%   BMI 29.11 kg/m   BP Readings from Last 3 Encounters:  05/01/24 128/78  04/03/24 138/80  11/30/23 (!) 148/76    Wt Readings from Last 3 Encounters:  05/01/24 205 lb 12.8 oz (93.4 kg)   04/03/24 213 lb 3.2 oz (96.7 kg)  11/30/23 222 lb (100.7 kg)    Physical Exam Vitals reviewed.  Constitutional:      Appearance: Normal appearance.  HENT:     Nose: Nose normal.     Mouth/Throat:     Mouth: Mucous membranes are moist.  Eyes:     General: No scleral icterus.    Conjunctiva/sclera: Conjunctivae normal.  Cardiovascular:     Rate and Rhythm: Normal rate and regular rhythm.     Heart sounds: No murmur heard.    No friction rub. No gallop.  Pulmonary:     Effort: Pulmonary effort is normal.     Breath sounds: No stridor. No wheezing, rhonchi or rales.  Abdominal:     General: Abdomen is protuberant. Bowel sounds are normal. There is no distension.     Palpations: Abdomen is soft. There is no hepatomegaly, splenomegaly or mass.     Tenderness: There is no abdominal tenderness. There is no guarding.  Musculoskeletal:        General: Normal range of motion.     Cervical back: Neck supple.     Right lower leg: 1+ Pitting Edema present.     Left lower leg: 1+ Pitting Edema present.  Lymphadenopathy:     Cervical: No cervical adenopathy.  Skin:    General: Skin is warm and dry.  Neurological:     General: No focal deficit present.     Mental Status: He is alert.  Psychiatric:        Mood and Affect: Mood normal.        Behavior: Behavior normal.     Lab Results  Component Value Date   WBC 9.3 05/01/2024   HGB 15.1 05/01/2024   HCT 44.7 05/01/2024   PLT 207.0 05/01/2024   GLUCOSE 238 (H) 05/01/2024   CHOL 116 05/01/2024   TRIG 149.0 05/01/2024   HDL 63.20 05/01/2024   LDLDIRECT 47.0 03/10/2021   LDLCALC 23 05/01/2024   ALT 33 05/01/2024   AST 30 05/01/2024   NA 139 05/01/2024   K 3.9 05/01/2024   CL 101 05/01/2024   CREATININE 1.21 05/01/2024   BUN 21 05/01/2024   CO2 26 05/01/2024   TSH 2.27 05/01/2024   PSA 0.01 (L) 11/30/2023   HGBA1C 7.3 (H) 02/29/2024   MICROALBUR <0.7 11/30/2023    Estimated Creatinine Clearance: 51.7 mL/min (by C-G  formula based on SCr of 1.21 mg/dL).   Assessment & Plan:  B12 nutritional deficiency -     Vitamin B12; Future -     Folate; Future  Essential hypertension- BP is well controlled. -     Basic metabolic panel with GFR; Future -     CBC with Differential/Platelet; Future -     TSH; Future -     Urinalysis, Routine w reflex microscopic; Future -     Hepatic function panel; Future  Hyperlipidemia with target LDL less than 130 -  Lipid panel; Future -     TSH; Future -     Hepatic function panel; Future  CKD stage 3 due to type 2 diabetes mellitus (HCC) -     Urinalysis, Routine w reflex microscopic; Future -     Dapagliflozin  Propanediol; Take 1 tablet (10 mg total) by mouth daily before breakfast.  Dispense: 90 tablet; Refill: 0 -     AMB Referral [facility] Care Management  Type 2 diabetes mellitus with stage 3a chronic kidney disease, without long-term current use of insulin  (HCC)- Blood sugar is adequately well controlled. -     Urinalysis, Routine w reflex microscopic; Future -     COVID-19 mRNA Vac-TriS(Pfizer); Inject 0.3 mLs into the muscle once for 1 dose.  Dispense: 0.3 mL; Refill: 0 -     Dapagliflozin  Propanediol; Take 1 tablet (10 mg total) by mouth daily before breakfast.  Dispense: 90 tablet; Refill: 0 -     AMB Referral [facility]BCI Care Management  Need for prophylactic vaccination and inoculation against varicella -     Shingrix ; Inject 0.5 mLs into the muscle once for 1 dose.  Dispense: 0.5 mL; Refill: 0     Follow-up: Return in about 3 months (around 07/30/2024).  Debby Molt, MD

## 2024-05-01 NOTE — Patient Instructions (Signed)
 Diarrhea, Adult Diarrhea is frequent loose and sometimes watery bowel movements. Diarrhea can make you feel weak and cause you to become dehydrated. Dehydration is a condition in which there is not enough water or other fluids in the body. Dehydration can make you tired and thirsty, cause you to have a dry mouth, and decrease how often you urinate. Diarrhea typically lasts 2-3 days. However, it can last longer if it is a sign of something more serious. It is important to treat your diarrhea as told by your health care provider. Follow these instructions at home: Eating and drinking     Follow these recommendations as told by your health care provider: Take an oral rehydration solution (ORS). This is an over-the-counter medicine that helps return your body to its normal balance of nutrients and water. It is found at pharmacies and retail stores. Drink enough fluid to keep your urine pale yellow. Drink fluids such as water, diluted fruit juice, and low-calorie sports drinks. You can drink milk also, if desired. Sucking on ice chips is another way to get fluids. Avoid drinking fluids that contain a lot of sugar or caffeine, such as soda, energy drinks, and regular sports drinks. Avoid alcohol. Eat bland, easy-to-digest foods in small amounts as you are able. These foods include bananas, applesauce, rice, lean meats, toast, and crackers. Avoid spicy or fatty foods.  Medicines Take over-the-counter and prescription medicines only as told by your health care provider. If you were prescribed antibiotics, take them as told by your health care provider. Do not stop using the antibiotic even if you start to feel better. General instructions  Wash your hands often using soap and water for at least 20 seconds. If soap and water are not available, use hand sanitizer. Others in the household should wash their hands as well. Hands should be washed: After using the toilet or changing a diaper. Before  preparing, cooking, or serving food. While caring for a sick person or while visiting someone in a hospital. Rest at home while you recover. Take a warm bath to relieve any burning or pain from frequent diarrhea episodes. Watch your condition for any changes. Contact a health care provider if: You have a fever. Your diarrhea gets worse. You have new symptoms. You vomit every time you eat or drink. You feel light-headed, dizzy, or have a headache. You have muscle cramps. You have signs of dehydration, such as: Dark urine, very little urine, or no urine. Cracked lips. Dry mouth. Sunken eyes. Sleepiness. Weakness. You have bloody or black stools or stools that look like tar. You have severe pain, cramping, or bloating in your abdomen. Your skin feels cold and clammy. You feel confused. Get help right away if: You have chest pain or your heart is beating very quickly. You have trouble breathing or you are breathing very quickly. You feel extremely weak or you faint. These symptoms may be an emergency. Get help right away. Call 911. Do not wait to see if the symptoms will go away. Do not drive yourself to the hospital. This information is not intended to replace advice given to you by your health care provider. Make sure you discuss any questions you have with your health care provider. Document Revised: 10/19/2021 Document Reviewed: 10/19/2021 Elsevier Patient Education  2024 [employer].

## 2024-05-02 DIAGNOSIS — Z23 Encounter for immunization: Secondary | ICD-10-CM | POA: Insufficient documentation

## 2024-05-02 MED ORDER — DAPAGLIFLOZIN PROPANEDIOL 10 MG PO TABS: 10.0000 mg | ORAL_TABLET | Freq: Every day | ORAL | 0 refills | Status: AC

## 2024-05-03 NOTE — Addendum Note (Signed)
 Addended by: JOSHUA DEBBY CROME on: 05/03/2024 12:24 PM   Modules accepted: Level of Service

## 2024-05-03 NOTE — Progress Notes (Signed)
 corrected

## 2024-05-13 ENCOUNTER — Telehealth: Payer: Self-pay | Admitting: *Deleted

## 2024-05-13 NOTE — Progress Notes (Unsigned)
 Care Guide Pharmacy Note  05/13/2024 Name: Louis Kent MRN: [id number] DOB: 06-24-1940  Referred By: Joshua Debby CROME, MD Reason for referral: Call Attempt #1 and Complex Care Management (Outreach to schedule referral with pharmacist )   [name] is a 83 y.o. year old male who is a primary care patient of Joshua Debby CROME, MD.  [name] was referred to the pharmacist for assistance related to: DMII  An unsuccessful telephone outreach was attempted today to contact the patient who was referred to the pharmacy team for assistance with medication management. Additional attempts will be made to contact the patient.  [name], CMA [place]  [facility], [facility] Guide Direct Dial: [phone]  Fax: [phone] Website: [email or web address]

## 2024-05-14 NOTE — Progress Notes (Signed)
 Care Guide Pharmacy Note  05/14/2024 Name: Louis Kent MRN: [id number] DOB: 04-Nov-1940  Referred By: Joshua Debby CROME, MD Reason for referral: Call Attempt #1 and Complex Care Management (Outreach to schedule referral with pharmacist )   Louis Kent is a 83 y.o. year old male who is a primary care patient of Joshua Debby CROME, MD.  Louis Kent was referred to the pharmacist for assistance related to: DMII  Successful contact was made with the patient to discuss pharmacy services including being ready for the pharmacist to call at least 5 minutes before the scheduled appointment time and to have medication bottles and any blood pressure readings ready for review. The patient agreed to meet with the pharmacist via telephone visit on 05/21/2024  [name], CMA [place]  [facility], [facility] Guide Direct Dial: [phone]  Fax: [phone] Website: [email or web address]

## 2024-05-21 ENCOUNTER — Other Ambulatory Visit

## 2024-05-21 DIAGNOSIS — E1122 Type 2 diabetes mellitus with diabetic chronic kidney disease: Secondary | ICD-10-CM

## 2024-05-21 DIAGNOSIS — I10 Essential (primary) hypertension: Secondary | ICD-10-CM

## 2024-05-21 DIAGNOSIS — I6523 Occlusion and stenosis of bilateral carotid arteries: Secondary | ICD-10-CM

## 2024-05-21 MED ORDER — IRBESARTAN 300 MG PO TABS: 300.0000 mg | ORAL_TABLET | Freq: Every day | ORAL | 1 refills | Status: AC

## 2024-05-21 NOTE — Patient Instructions (Addendum)
 It was a pleasure speaking with you today!  Discontinue torsemide  and continue to monitor blood pressure and leg edema. Refills sent for irbesartan . No changes to other medications.   Feel free to call with any questions or concerns!  Darrelyn Drum, PharmD, BCPS, CPP Clinical Pharmacist Practitioner [facility] [phone]

## 2024-05-21 NOTE — Progress Notes (Signed)
 "  05/21/2024 Name: Louis Kent MRN: [id number] DOB: 10/09/1940  Chief Complaint  Patient presents with   Diabetes   Hypertension    Louis Kent is a 84 y.o. year old male who presented for a telephone visit.   They were referred to the pharmacist by their PCP for assistance in managing diabetes and hypertension.    Subjective:  Care Team: Primary Care Provider: Joshua Debby CROME, MD ; Next Scheduled Visit: 07/31/24  Medication Access/Adherence  Current Pharmacy:  [id number] - [place], [place] - [address] AT [address] [place] [address] Phone: [phone] Fax: [phone]  [id number] [place], [place] - [address] AT [facility] [address] [place] [address] Phone: [phone] Fax: [phone]  [id number] - [place], [place] - [address] AT CORNER OF [facility] [address] [place] [address] Phone: [phone] Fax: [phone]   Patient reports affordability concerns with their medications: No  Patient reports access/transportation concerns to their pharmacy: No  Patient reports adherence concerns with their medications:  No     Diabetes:  Current medications: Farxiga  10 mg daily (also for CKD) Medications tried in the past: none  *Pt was prescribed Ozempic  0.25 mg 11/30/23, however he never started due to making lifestyle changes instead. He notes he did purchase it and has had it stored in the refrigerator.   Hypertension:  Current medications: irbesartan  300 mg daily, torsemide  10 mg daily (also prescribed for leg edema) Medications previously tried: hydrochlorothiazide    Objective: BP Readings from Last 3 Encounters:  05/01/24 128/78  04/03/24 138/80  11/30/23 (!) 148/76    Lab Results  Component Value Date   HGBA1C 7.3 (H) 02/29/2024    Lab Results  Component Value Date    CREATININE 1.21 05/01/2024   BUN 21 05/01/2024   NA 139 05/01/2024   K 3.9 05/01/2024   CL 101 05/01/2024   CO2 26 05/01/2024    Lab Results  Component Value Date   CHOL 116 05/01/2024   HDL 63.20 05/01/2024   LDLCALC 23 05/01/2024   LDLDIRECT 47.0 03/10/2021   TRIG 149.0 05/01/2024   CHOLHDL 2 05/01/2024    Medications Reviewed Today     Reviewed by Merceda Lela JONELLE, RPH-CPP (Pharmacist) on 05/21/24 at 1129  Med List Status: <None>   Medication Order Taking? Sig Documenting Provider Last Dose Status Informant  allopurinol  (ZYLOPRIM ) 100 MG tablet [id number] Yes TAKE 1 TABLET BY MOUTH EVERY DAY [name], DO  Active   atorvastatin  (LIPITOR) 10 MG tablet [id number] Yes TAKE 1 TABLET BY MOUTH EVERY DAY Joshua Debby CROME, MD  Active   Cholecalciferol  (VITAMIN D3) 50 MCG (2000 UT) capsule [id number] Yes TAKE 1 CAPSULE BY MOUTH EVERY DAY Joshua Debby CROME, MD  Active   dapagliflozin  propanediol (FARXIGA ) 10 MG TABS tablet [id number] Yes Take 1 tablet (10 mg total) by mouth daily before breakfast. Joshua Debby CROME, MD  Active   Insulin  Pen Needle 32G X 6 MM MISC [id number]  1 Act by Does not apply route once a week.  Patient not taking: Reported on 05/21/2024   Joshua Debby CROME, MD  Active   irbesartan  (AVAPRO ) 300 MG tablet [id number] Yes TAKE 1 TABLET BY MOUTH EVERY DAY Joshua Debby CROME, MD  Active   Multiple Vitamins-Minerals (MULTIVITAMIN WITH MINERALS) tablet [id number] Yes Take by mouth. [provider]  Active  Omega-3 1400 MG CAPS [id number] Yes Take by mouth. [provider]  Active   torsemide  (DEMADEX ) 10 MG tablet [id number] Yes TAKE 1 TABLET BY MOUTH EVERY DAY Joshua Debby CROME, MD  Active             Assessment/Plan:   Diabetes: - Currently controlled, Goal A1c <8% - Continue current regimen - Recommend against starting GLP1 due to history of gallstones    Hypertension: - Currently controlled, BP goal <130/80 - Refills sent for irbesartan   -  Discussed torsemide  and signs and symptoms of water retention and impact on kidney function. Patient agreeable to trial off of torsemide . Patient will monitor blood pressure and edema.     Follow Up Plan: Check-in via MyChart messaging   Darrelyn Drum, PharmD, BCPS, CPP Clinical Pharmacist Practitioner [place] [facility] at [facility] [phone]    "

## 2024-05-28 ENCOUNTER — Telehealth: Payer: Self-pay | Admitting: Pharmacist

## 2024-05-28 DIAGNOSIS — R600 Localized edema: Secondary | ICD-10-CM

## 2024-05-28 DIAGNOSIS — I10 Essential (primary) hypertension: Secondary | ICD-10-CM

## 2024-05-28 MED ORDER — TORSEMIDE 10 MG PO TABS: 10.0000 mg | ORAL_TABLET | Freq: Every day | ORAL | Status: AC

## 2024-05-28 NOTE — Telephone Encounter (Signed)
 Pt reported via MyChart that he did begin experiencing edema in his legs after stopping torsemide . He has restarted it. Will add back to med list. Pt to continue regimen until next PCP follow up.  Darrelyn Drum, PharmD, BCPS, CPP Clinical Pharmacist Practitioner [place] [facility] [phone]

## 2024-06-12 ENCOUNTER — Ambulatory Visit: Payer: Self-pay | Admitting: Internal Medicine

## 2024-06-12 ENCOUNTER — Ambulatory Visit: Admitting: Internal Medicine

## 2024-06-12 ENCOUNTER — Encounter: Payer: Self-pay | Admitting: Internal Medicine

## 2024-06-12 VITALS — BP 144/86 | HR 82 | Temp 97.8°F | Ht 70.5 in | Wt 194.4 lb

## 2024-06-12 DIAGNOSIS — E1122 Type 2 diabetes mellitus with diabetic chronic kidney disease: Secondary | ICD-10-CM

## 2024-06-12 DIAGNOSIS — A0472 Enterocolitis due to Clostridium difficile, not specified as recurrent: Secondary | ICD-10-CM

## 2024-06-12 DIAGNOSIS — I10 Essential (primary) hypertension: Secondary | ICD-10-CM

## 2024-06-12 DIAGNOSIS — M0579 Rheumatoid arthritis with rheumatoid factor of multiple sites without organ or systems involvement: Secondary | ICD-10-CM

## 2024-06-12 DIAGNOSIS — R152 Fecal urgency: Secondary | ICD-10-CM | POA: Insufficient documentation

## 2024-06-12 DIAGNOSIS — E119 Type 2 diabetes mellitus without complications: Secondary | ICD-10-CM | POA: Insufficient documentation

## 2024-06-12 DIAGNOSIS — R197 Diarrhea, unspecified: Secondary | ICD-10-CM | POA: Insufficient documentation

## 2024-06-12 LAB — URINALYSIS, ROUTINE W REFLEX MICROSCOPIC
Bilirubin Urine: NEGATIVE
Hgb urine dipstick: NEGATIVE
Ketones, ur: NEGATIVE
Leukocytes,Ua: NEGATIVE
Nitrite: NEGATIVE
RBC / HPF: NONE SEEN
Specific Gravity, Urine: <=1.005 — AB (ref 1.000–1.030)
Total Protein, Urine: NEGATIVE
Urine Glucose: >=1000 — AB
Urobilinogen, UA: 0.2 (ref 0.0–1.0)
pH: 6 (ref 5.0–8.0)

## 2024-06-12 LAB — MICROALBUMIN / CREATININE URINE RATIO
Creatinine,U: 37.2 mg/dL
Microalb Creat Ratio: UNDETERMINED mg/g (ref 0.0–30.0)
Microalb, Ur: <0.7 mg/dL

## 2024-06-12 LAB — HEMOGLOBIN A1C: Hgb A1c MFr Bld: 9.6 % — ABNORMAL HIGH (ref 4.6–6.5)

## 2024-06-12 LAB — C-REACTIVE PROTEIN: CRP: 0.6 mg/dL — ABNORMAL LOW (ref 1.0–20.0)

## 2024-06-12 NOTE — Patient Instructions (Signed)
 Chronic Diarrhea Chronic diarrhea is when a person passes frequent loose and sometimes watery stools for 4 weeks or longer. Non-chronic diarrhea usually lasts for only 2-3 days. Diarrhea can cause a person to feel weak and become dehydrated. Dehydration is a condition in which there is not enough water or other fluids in the body. Dehydration can make the person tired and thirsty. It can also cause a dry mouth, decreased urination, and dark yellow urine. Diarrhea is a sign of an underlying problem, such as: Infection. Side effects of medicines. Problems digesting something in your diet, such as milk products if you have lactose intolerance. Conditions such as celiac disease, irritable bowel syndrome (IBS), or inflammatory bowel disease (IBD). If you have chronic diarrhea, make sure you treat it as told by your health care provider. Follow these instructions at home: Medicines Take over-the-counter and prescription medicines only as told by your health care provider. If you were prescribed antibiotics, take them as told by your health care provider. Do not stop taking the antibiotic even if you start to feel better. Eating and drinking  Follow instructions from your health care provider about what to eat and drink. You may have to: Avoid foods that trigger diarrhea for you. Take an oral rehydration solution (ORS). This is a drink that keeps you hydrated. It can be found at pharmacies and retail stores. Drink clear fluids, such as water, diluted fruit juice, and low-calorie sports drinks. You can also get fluids by sucking on ice chips. Drink enough fluid to keep your urine pale yellow. This will help you avoid dehydration. Eat small amounts of bland foods that are easy to digest as you are able. These foods include bananas, applesauce, rice, lean meats, toast, and crackers. Avoid spicy or fatty foods. Avoid foods and drinks that contain a lot of sugar or caffeine. Do not drink alcohol if: Your  health care provider tells you not to drink. You are pregnant, may be pregnant, or are planning to become pregnant. If you drink alcohol: Limit how much you have to: 0-1 drink a day for women. 0-2 drinks a day for men. Know how much alcohol is in your drink. In the [place] one drink equals one 12 oz bottle of beer (355 mL), one 5 oz glass of wine (148 mL), or one 1 oz glass of hard liquor (44 mL). General instructions  Wash your hands often and after each diarrhea episode for at least 20 seconds. Use soap and water. If soap and water are not available, use hand sanitizer. Make sure that all people in your household wash their hands well and often. Rest as told by your health care provider. Take a warm bath to relieve any burning or pain from frequent diarrhea episodes. Watch your condition for any changes. Contact a health care provider if: You have a fever. Your diarrhea gets worse or does not get better. You have new symptoms. You vomit every time you eat or drink. You feel light-headed or dizzy. You have muscle cramps. You have severe pain in the rectum. You have signs of dehydration, such as: Dark urine, very little urine, or no urine. Cracked lips. Dry mouth. Sunken eyes. Sleepiness. Weakness. You have bloody or black stools, or stools that look like tar. You have severe pain, cramping, or bloating in your abdomen, or pain that stays in one place. Your skin feels cold and clammy. You feel confused. You have a severe headache. Get help right away if: You have chest pain  or your heart is beating very quickly. You have trouble breathing or you are breathing very quickly. You feel extremely weak or you faint. These symptoms may be an emergency. Get help right away. Call 911. Do not wait to see if the symptoms will go away. Do not drive yourself to the hospital. This information is not intended to replace advice given to you by your health care provider. Make sure you discuss  any questions you have with your health care provider. Document Revised: 10/19/2021 Document Reviewed: 10/19/2021 Elsevier Patient Education  2024 [employer].

## 2024-06-12 NOTE — Progress Notes (Unsigned)
 "  Subjective:  Patient ID: Louis Kent, male    DOB: 1941-03-30  Age: 84 y.o. MRN: [id number]  CC: Diabetes  Discussed the use of AI scribe software for clinical note transcription with the patient, who gave verbal consent to proceed.  History of Present Illness Louis Kent is an 84 year old male who presents with chronic loose stools and abdominal discomfort.  He has been experiencing loose stools and periodic gas pains since December. The stools are not formed as he used to be, and he mentions occasional incontinence, particularly when he was in [place] . He uses Imodium every other day, which helps limit bowel movements to twice a day, but the symptoms have not resolved completely.  He has observed oily droplets in the toilet water, suspecting undigested fat. No nausea, vomiting, or blood in the stool. His appetite remains good, although he has experienced significant weight loss, dropping from 235 pounds to 182-183 pounds, which he attributes to the diarrhea.  A couple of years ago, he was treated with antibiotics for a viral infection and took measures to restore his gut microbiome. Currently, he is not taking any probiotics but consumes Greek yogurt for breakfast. He has tried dietary changes, such as increasing banana intake, to manage his symptoms.  He expresses concern about potential underlying issues with his pancreas, liver, or gallbladder, and mentions a history of kidney failure. He experiences occasional mild pain in the lower abdomen, described as gas pain. He has a hernia that he consulted a surgeon about but decided not to pursue surgery at this time.     HPI Louis Kent presents for f/up -  History Louis Kent has a past medical history of Hyperlipidemia, Hypertension, and S/P prostatectomy.   He has a past surgical history that includes Ankle arthroscopy w/ open repair; Prostate surgery; Tonsillectomy; and bilater hip replacements.   His family history includes  Diabetes in his mother; Lung cancer in his father and another family member; Stroke in his mother.He reports that he has never smoked. He has never used smokeless tobacco. He reports that he does not drink alcohol and does not use drugs.  Outpatient Medications Prior to Visit  Medication Sig Dispense Refill   allopurinol  (ZYLOPRIM ) 100 MG tablet TAKE 1 TABLET BY MOUTH EVERY DAY 90 tablet 1   atorvastatin  (LIPITOR) 10 MG tablet TAKE 1 TABLET BY MOUTH EVERY DAY 90 tablet 1   Cholecalciferol  (VITAMIN D3) 50 MCG (2000 UT) capsule TAKE 1 CAPSULE BY MOUTH EVERY DAY 90 capsule 1   dapagliflozin  propanediol (FARXIGA ) 10 MG TABS tablet Take 1 tablet (10 mg total) by mouth daily before breakfast. 90 tablet 0   irbesartan  (AVAPRO ) 300 MG tablet Take 1 tablet (300 mg total) by mouth daily. 90 tablet 1   Multiple Vitamins-Minerals (MULTIVITAMIN WITH MINERALS) tablet Take by mouth.     Omega-3 1400 MG CAPS Take by mouth.     torsemide  (DEMADEX ) 10 MG tablet Take 1 tablet (10 mg total) by mouth daily.     No facility-administered medications prior to visit.    ROS Review of Systems  Constitutional:  Positive for unexpected weight change. Negative for appetite change, chills, diaphoresis, fatigue and fever.  HENT: Negative.    Eyes: Negative.   Respiratory: Negative.  Negative for cough, chest tightness, shortness of breath and wheezing.   Cardiovascular:  Negative for chest pain, palpitations and leg swelling.  Gastrointestinal:  Positive for diarrhea. Negative for abdominal pain, blood in stool, constipation, nausea  and vomiting.  Endocrine: Negative.   Genitourinary:  Negative for difficulty urinating.  Musculoskeletal: Negative.  Negative for arthralgias, joint swelling and myalgias.  Skin: Negative.   Neurological: Negative.  Negative for dizziness and weakness.  Hematological:  Negative for adenopathy. Does not bruise/bleed easily.  Psychiatric/Behavioral: Negative.      Objective:  BP (!)  144/86 (BP Location: Left Arm, Patient Position: Sitting, Cuff Size: Normal)   Pulse 82   Temp 97.8 F (36.6 C) (Oral)   Ht 5' 10.5 (1.791 m)   Wt 194 lb 6.4 oz (88.2 kg)   SpO2 99%   BMI 27.50 kg/m   Physical Exam Vitals reviewed.  Constitutional:      Appearance: Normal appearance.  HENT:     Nose: Nose normal.     Mouth/Throat:     Mouth: Mucous membranes are moist.  Eyes:     General: No scleral icterus.    Conjunctiva/sclera: Conjunctivae normal.  Cardiovascular:     Rate and Rhythm: Normal rate and regular rhythm.     Heart sounds: No murmur heard.    No friction rub. No gallop.     Comments: EKG--- NSR, 82 bpm No LVH, Q waves, or ST/T wave changes  Pulmonary:     Effort: Pulmonary effort is normal.     Breath sounds: No stridor. No wheezing, rhonchi or rales.  Abdominal:     General: Abdomen is protuberant. Bowel sounds are normal.     Palpations: There is no hepatomegaly, splenomegaly or mass.     Tenderness: There is no abdominal tenderness. There is no guarding or rebound.     Hernia: A hernia is present. Hernia is present in the ventral area. There is no hernia in the umbilical area, left inguinal area, right femoral area, left femoral area or right inguinal area.  Musculoskeletal:        General: Normal range of motion.     Cervical back: Neck supple.     Right lower leg: No edema.     Left lower leg: No edema.  Lymphadenopathy:     Cervical: No cervical adenopathy.  Skin:    General: Skin is warm and dry.     Findings: No rash.  Neurological:     General: No focal deficit present.     Mental Status: He is alert.  Psychiatric:        Mood and Affect: Mood normal.        Behavior: Behavior normal.     Lab Results  Component Value Date   WBC 9.3 05/01/2024   HGB 15.1 05/01/2024   HCT 44.7 05/01/2024   PLT 207.0 05/01/2024   GLUCOSE 238 (H) 05/01/2024   CHOL 116 05/01/2024   TRIG 149.0 05/01/2024   HDL 63.20 05/01/2024   LDLDIRECT 47.0  03/10/2021   LDLCALC 23 05/01/2024   ALT 33 05/01/2024   AST 30 05/01/2024   NA 139 05/01/2024   K 3.9 05/01/2024   CL 101 05/01/2024   CREATININE 1.21 05/01/2024   BUN 21 05/01/2024   CO2 26 05/01/2024   TSH 2.27 05/01/2024   PSA 0.01 (L) 11/30/2023   HGBA1C 9.6 (H) 06/12/2024   MICROALBUR <0.7 06/12/2024     Assessment & Plan:  Type 2 diabetes mellitus with stage 3a chronic kidney disease, without long-term current use of insulin  (HCC) -     Hemoglobin A1c; Future -     Microalbumin / creatinine urine ratio; Future -     Urinalysis,  Routine w reflex microscopic; Future  Diarrhea of presumed infectious origin -     Pancreatic elastase, fecal; Future -     Fecal lactoferrin, quant; Future -     CALPROTECTIN; Future -     Tissue Transglutaminase Abs,IgG,IgA; Future -     GI Profile, Stool, PCR; Future -     C-reactive protein; Future  Essential hypertension -     EKG 12-Lead  Rheumatoid arthritis involving multiple sites with positive rheumatoid factor (HCC)  Insulin -requiring or dependent type II diabetes mellitus (HCC) -     HM Diabetes Foot Exam -     Toujeo  SoloStar; Inject 20 Units into the skin daily.  Dispense: 6 mL; Refill: 0 -     FreeStyle Libre 3 Plus Sensor; Apply 1 Act topically every 14 (fourteen) days. Change sensor every 15 days.  Dispense: 6 each; Refill: 1 -     AMB Referral VBCI Care Management  C. difficile diarrhea -     Vancomycin  HCl; Take 1 capsule (125 mg total) by mouth 4 (four) times daily for 10 days.  Dispense: 40 capsule; Refill: 0      Follow-up: Return in about 4 months (around 10/10/2024).  Debby Molt, MD "

## 2024-06-13 MED ORDER — TOUJEO SOLOSTAR 300 UNIT/ML ~~LOC~~ SOPN: 20.0000 [IU] | PEN_INJECTOR | Freq: Every day | SUBCUTANEOUS | 0 refills | Status: AC

## 2024-06-13 MED ORDER — FREESTYLE LIBRE 3 PLUS SENSOR MISC: 1.0000 | 1 refills | Status: AC

## 2024-06-14 DIAGNOSIS — A0472 Enterocolitis due to Clostridium difficile, not specified as recurrent: Secondary | ICD-10-CM | POA: Insufficient documentation

## 2024-06-14 LAB — GI PROFILE, STOOL, PCR

## 2024-06-14 MED ORDER — VANCOMYCIN HCL 125 MG PO CAPS: 125.0000 mg | ORAL_CAPSULE | Freq: Four times a day (QID) | ORAL | 0 refills | Status: AC

## 2024-06-15 LAB — TISSUE TRANSGLUTAMINASE ABS,IGG,IGA
(tTG) Ab, IgA: <1 U/mL
(tTG) Ab, IgG: <1 U/mL

## 2024-06-21 LAB — CALPROTECTIN: Calprotectin: 13 ug/g

## 2024-06-21 LAB — FECAL LACTOFERRIN, QUANT: LACTOFERRIN, QL, STOOL: NEGATIVE

## 2024-06-21 LAB — PANCREATIC ELASTASE, FECAL: Pancreatic Elastase-1, Stool: <10 ug/g — ABNORMAL LOW

## 2024-07-31 ENCOUNTER — Ambulatory Visit: Admitting: Internal Medicine

## 2025-05-05 ENCOUNTER — Encounter: Admitting: Internal Medicine

## 2025-05-05 ENCOUNTER — Ambulatory Visit
